# Patient Record
Sex: Male | Born: 1950 | Race: White | Hispanic: No | Marital: Married | Smoking: Former smoker
Health system: Southern US, Community
[De-identification: ages and names within clinical notes are randomized; demographics above are authoritative.]

## PROBLEM LIST (undated history)

## (undated) HISTORY — PX: LACERATION REPAIR: SHX5168

---

## 2019-10-05 ENCOUNTER — Other Ambulatory Visit: Payer: Self-pay

## 2019-10-05 DIAGNOSIS — Z20822 Contact with and (suspected) exposure to covid-19: Secondary | ICD-10-CM

## 2019-10-08 LAB — NOVEL CORONAVIRUS, NAA: SARS-CoV-2, NAA: NOT DETECTED

## 2019-10-13 DIAGNOSIS — R000 Tachycardia, unspecified: Secondary | ICD-10-CM | POA: Diagnosis not present

## 2019-10-13 DIAGNOSIS — R0789 Other chest pain: Secondary | ICD-10-CM | POA: Diagnosis not present

## 2019-10-13 DIAGNOSIS — M62838 Other muscle spasm: Secondary | ICD-10-CM | POA: Diagnosis not present

## 2019-10-14 ENCOUNTER — Emergency Department (HOSPITAL_COMMUNITY): Payer: Medicare Other

## 2019-10-14 ENCOUNTER — Inpatient Hospital Stay (HOSPITAL_COMMUNITY)
Admission: EM | Disposition: A | Discharge status: Rehabilitation Facility | Payer: Self-pay | Source: Home / Self Care | Attending: Neurological Surgery

## 2019-10-14 ENCOUNTER — Emergency Department (HOSPITAL_COMMUNITY): Payer: Medicare Other | Admitting: Certified Registered Nurse Anesthetist

## 2019-10-14 ENCOUNTER — Encounter (HOSPITAL_COMMUNITY): Payer: Self-pay | Admitting: Neurological Surgery

## 2019-10-14 ENCOUNTER — Other Ambulatory Visit: Payer: Self-pay

## 2019-10-14 ENCOUNTER — Inpatient Hospital Stay (HOSPITAL_COMMUNITY)
Admission: EM | Admit: 2019-10-14 | Discharge: 2019-10-18 | DRG: 029 | Disposition: A | Discharge status: Rehabilitation Facility | Payer: Medicare Other | Attending: Neurological Surgery | Admitting: Neurological Surgery

## 2019-10-14 DIAGNOSIS — G8221 Paraplegia, complete: Secondary | ICD-10-CM | POA: Diagnosis not present

## 2019-10-14 DIAGNOSIS — R251 Tremor, unspecified: Secondary | ICD-10-CM | POA: Diagnosis not present

## 2019-10-14 DIAGNOSIS — M4653 Other infective spondylopathies, cervicothoracic region: Secondary | ICD-10-CM | POA: Diagnosis present

## 2019-10-14 DIAGNOSIS — K592 Neurogenic bowel, not elsewhere classified: Secondary | ICD-10-CM | POA: Diagnosis not present

## 2019-10-14 DIAGNOSIS — G9520 Unspecified cord compression: Secondary | ICD-10-CM | POA: Diagnosis not present

## 2019-10-14 DIAGNOSIS — N318 Other neuromuscular dysfunction of bladder: Secondary | ICD-10-CM | POA: Diagnosis present

## 2019-10-14 DIAGNOSIS — R739 Hyperglycemia, unspecified: Secondary | ICD-10-CM | POA: Diagnosis present

## 2019-10-14 DIAGNOSIS — Z419 Encounter for procedure for purposes other than remedying health state, unspecified: Secondary | ICD-10-CM

## 2019-10-14 DIAGNOSIS — L02212 Cutaneous abscess of back [any part, except buttock]: Secondary | ICD-10-CM | POA: Diagnosis not present

## 2019-10-14 DIAGNOSIS — G4734 Idiopathic sleep related nonobstructive alveolar hypoventilation: Secondary | ICD-10-CM | POA: Diagnosis not present

## 2019-10-14 DIAGNOSIS — I480 Paroxysmal atrial fibrillation: Secondary | ICD-10-CM | POA: Diagnosis not present

## 2019-10-14 DIAGNOSIS — F1010 Alcohol abuse, uncomplicated: Secondary | ICD-10-CM | POA: Diagnosis present

## 2019-10-14 DIAGNOSIS — K5900 Constipation, unspecified: Secondary | ICD-10-CM | POA: Diagnosis not present

## 2019-10-14 DIAGNOSIS — M4804 Spinal stenosis, thoracic region: Secondary | ICD-10-CM | POA: Diagnosis present

## 2019-10-14 DIAGNOSIS — G9529 Other cord compression: Secondary | ICD-10-CM | POA: Diagnosis not present

## 2019-10-14 DIAGNOSIS — M4802 Spinal stenosis, cervical region: Secondary | ICD-10-CM | POA: Diagnosis not present

## 2019-10-14 DIAGNOSIS — D638 Anemia in other chronic diseases classified elsewhere: Secondary | ICD-10-CM | POA: Diagnosis present

## 2019-10-14 DIAGNOSIS — K5901 Slow transit constipation: Secondary | ICD-10-CM | POA: Diagnosis not present

## 2019-10-14 DIAGNOSIS — M48061 Spinal stenosis, lumbar region without neurogenic claudication: Secondary | ICD-10-CM | POA: Diagnosis not present

## 2019-10-14 DIAGNOSIS — L89150 Pressure ulcer of sacral region, unstageable: Secondary | ICD-10-CM | POA: Diagnosis not present

## 2019-10-14 DIAGNOSIS — I951 Orthostatic hypotension: Secondary | ICD-10-CM | POA: Diagnosis not present

## 2019-10-14 DIAGNOSIS — M5489 Other dorsalgia: Secondary | ICD-10-CM | POA: Diagnosis not present

## 2019-10-14 DIAGNOSIS — Z79899 Other long term (current) drug therapy: Secondary | ICD-10-CM | POA: Diagnosis not present

## 2019-10-14 DIAGNOSIS — B9689 Other specified bacterial agents as the cause of diseases classified elsewhere: Secondary | ICD-10-CM | POA: Diagnosis not present

## 2019-10-14 DIAGNOSIS — D62 Acute posthemorrhagic anemia: Secondary | ICD-10-CM | POA: Diagnosis not present

## 2019-10-14 DIAGNOSIS — S32040A Wedge compression fracture of fourth lumbar vertebra, initial encounter for closed fracture: Secondary | ICD-10-CM | POA: Diagnosis not present

## 2019-10-14 DIAGNOSIS — R269 Unspecified abnormalities of gait and mobility: Secondary | ICD-10-CM | POA: Diagnosis present

## 2019-10-14 DIAGNOSIS — G9589 Other specified diseases of spinal cord: Secondary | ICD-10-CM | POA: Diagnosis not present

## 2019-10-14 DIAGNOSIS — A419 Sepsis, unspecified organism: Secondary | ICD-10-CM | POA: Diagnosis not present

## 2019-10-14 DIAGNOSIS — R52 Pain, unspecified: Secondary | ICD-10-CM | POA: Diagnosis not present

## 2019-10-14 DIAGNOSIS — G061 Intraspinal abscess and granuloma: Principal | ICD-10-CM | POA: Diagnosis present

## 2019-10-14 DIAGNOSIS — R531 Weakness: Secondary | ICD-10-CM | POA: Diagnosis not present

## 2019-10-14 DIAGNOSIS — Z20828 Contact with and (suspected) exposure to other viral communicable diseases: Secondary | ICD-10-CM | POA: Diagnosis present

## 2019-10-14 DIAGNOSIS — R339 Retention of urine, unspecified: Secondary | ICD-10-CM | POA: Diagnosis not present

## 2019-10-14 DIAGNOSIS — G8220 Paraplegia, unspecified: Secondary | ICD-10-CM | POA: Diagnosis present

## 2019-10-14 DIAGNOSIS — D649 Anemia, unspecified: Secondary | ICD-10-CM | POA: Diagnosis not present

## 2019-10-14 DIAGNOSIS — M546 Pain in thoracic spine: Secondary | ICD-10-CM | POA: Diagnosis not present

## 2019-10-14 DIAGNOSIS — Z23 Encounter for immunization: Secondary | ICD-10-CM

## 2019-10-14 DIAGNOSIS — G062 Extradural and subdural abscess, unspecified: Secondary | ICD-10-CM | POA: Diagnosis present

## 2019-10-14 DIAGNOSIS — I82463 Acute embolism and thrombosis of calf muscular vein, bilateral: Secondary | ICD-10-CM | POA: Diagnosis not present

## 2019-10-14 DIAGNOSIS — K089 Disorder of teeth and supporting structures, unspecified: Secondary | ICD-10-CM | POA: Diagnosis present

## 2019-10-14 DIAGNOSIS — A491 Streptococcal infection, unspecified site: Secondary | ICD-10-CM

## 2019-10-14 DIAGNOSIS — L89312 Pressure ulcer of right buttock, stage 2: Secondary | ICD-10-CM | POA: Diagnosis not present

## 2019-10-14 DIAGNOSIS — G904 Autonomic dysreflexia: Secondary | ICD-10-CM | POA: Diagnosis not present

## 2019-10-14 DIAGNOSIS — R252 Cramp and spasm: Secondary | ICD-10-CM | POA: Diagnosis not present

## 2019-10-14 DIAGNOSIS — N319 Neuromuscular dysfunction of bladder, unspecified: Secondary | ICD-10-CM | POA: Diagnosis not present

## 2019-10-14 DIAGNOSIS — Z981 Arthrodesis status: Secondary | ICD-10-CM | POA: Diagnosis not present

## 2019-10-14 DIAGNOSIS — I10 Essential (primary) hypertension: Secondary | ICD-10-CM | POA: Diagnosis not present

## 2019-10-14 DIAGNOSIS — G8250 Quadriplegia, unspecified: Secondary | ICD-10-CM | POA: Diagnosis not present

## 2019-10-14 DIAGNOSIS — K006 Disturbances in tooth eruption: Secondary | ICD-10-CM | POA: Diagnosis not present

## 2019-10-14 HISTORY — PX: THORACIC LAMINECTOMY FOR EPIDURAL ABSCESS: SHX6115

## 2019-10-14 LAB — PROTIME-INR
INR: 1.3 — ABNORMAL HIGH (ref 0.8–1.2)
Prothrombin Time: 16 seconds — ABNORMAL HIGH (ref 11.4–15.2)

## 2019-10-14 LAB — CBC WITH DIFFERENTIAL/PLATELET
Abs Immature Granulocytes: 0.12 10*3/uL — ABNORMAL HIGH (ref 0.00–0.07)
Basophils Absolute: 0 10*3/uL (ref 0.0–0.1)
Basophils Relative: 0 %
Eosinophils Absolute: 0 10*3/uL (ref 0.0–0.5)
Eosinophils Relative: 0 %
HCT: 21.2 % — ABNORMAL LOW (ref 39.0–52.0)
Hemoglobin: 7.4 g/dL — ABNORMAL LOW (ref 13.0–17.0)
Immature Granulocytes: 1 %
Lymphocytes Relative: 8 %
Lymphs Abs: 1.3 10*3/uL (ref 0.7–4.0)
MCH: 31.6 pg (ref 26.0–34.0)
MCHC: 34.9 g/dL (ref 30.0–36.0)
MCV: 90.6 fL (ref 80.0–100.0)
Monocytes Absolute: 1 10*3/uL (ref 0.1–1.0)
Monocytes Relative: 6 %
Neutro Abs: 13.8 10*3/uL — ABNORMAL HIGH (ref 1.7–7.7)
Neutrophils Relative %: 85 %
Platelets: 464 10*3/uL — ABNORMAL HIGH (ref 150–400)
RBC: 2.34 MIL/uL — ABNORMAL LOW (ref 4.22–5.81)
RDW: 12 % (ref 11.5–15.5)
WBC: 16.2 10*3/uL — ABNORMAL HIGH (ref 4.0–10.5)
nRBC: 0 % (ref 0.0–0.2)

## 2019-10-14 LAB — COMPREHENSIVE METABOLIC PANEL
ALT: 39 U/L (ref 0–44)
AST: 29 U/L (ref 15–41)
Albumin: 2.5 g/dL — ABNORMAL LOW (ref 3.5–5.0)
Alkaline Phosphatase: 57 U/L (ref 38–126)
Anion gap: 12 (ref 5–15)
BUN: 9 mg/dL (ref 8–23)
CO2: 24 mmol/L (ref 22–32)
Calcium: 8.5 mg/dL — ABNORMAL LOW (ref 8.9–10.3)
Chloride: 96 mmol/L — ABNORMAL LOW (ref 98–111)
Creatinine, Ser: 0.7 mg/dL (ref 0.61–1.24)
GFR calc Af Amer: >60 mL/min (ref >60)
GFR calc non Af Amer: >60 mL/min (ref >60)
Glucose, Bld: 111 mg/dL — ABNORMAL HIGH (ref 70–99)
Potassium: 3.4 mmol/L — ABNORMAL LOW (ref 3.5–5.1)
Sodium: 132 mmol/L — ABNORMAL LOW (ref 135–145)
Total Bilirubin: 0.7 mg/dL (ref 0.3–1.2)
Total Protein: 6.2 g/dL — ABNORMAL LOW (ref 6.5–8.1)

## 2019-10-14 LAB — APTT: aPTT: 31 seconds (ref 24–36)

## 2019-10-14 LAB — ABO/RH: ABO/RH(D): A POS

## 2019-10-14 LAB — SARS CORONAVIRUS 2 (TAT 6-24 HRS): SARS Coronavirus 2: NEGATIVE

## 2019-10-14 LAB — URINALYSIS, ROUTINE W REFLEX MICROSCOPIC
Bilirubin Urine: NEGATIVE
Glucose, UA: NEGATIVE mg/dL
Hgb urine dipstick: NEGATIVE
Ketones, ur: NEGATIVE mg/dL
Leukocytes,Ua: NEGATIVE
Nitrite: NEGATIVE
Protein, ur: NEGATIVE mg/dL
Specific Gravity, Urine: 1.018 (ref 1.005–1.030)
pH: 6 (ref 5.0–8.0)

## 2019-10-14 LAB — LACTIC ACID, PLASMA: Lactic Acid, Venous: 0.7 mmol/L (ref 0.5–1.9)

## 2019-10-14 LAB — SEDIMENTATION RATE: Sed Rate: 110 mm/hr — ABNORMAL HIGH (ref 0–16)

## 2019-10-14 LAB — PREPARE RBC (CROSSMATCH)

## 2019-10-14 LAB — C-REACTIVE PROTEIN: CRP: 12.5 mg/dL — ABNORMAL HIGH (ref <1.0)

## 2019-10-14 SURGERY — THORACIC LAMINECTOMY FOR EPIDURAL ABSCESS
Anesthesia: General | Site: Back

## 2019-10-14 MED ORDER — METHOCARBAMOL 500 MG PO TABS
500.0000 mg | ORAL_TABLET | Freq: Four times a day (QID) | ORAL | Status: DC | PRN
  Administered 2019-10-15 – 2019-10-18 (×3): 500 mg via ORAL
  Filled 2019-10-14 (×4): qty 1

## 2019-10-14 MED ORDER — LACTATED RINGERS IV BOLUS
1000.0000 mL | Freq: Once | INTRAVENOUS | Status: AC
  Administered 2019-10-14: 1000 mL via INTRAVENOUS

## 2019-10-14 MED ORDER — PHENYLEPHRINE HCL-NACL 10-0.9 MG/250ML-% IV SOLN
INTRAVENOUS | Status: DC | PRN
  Administered 2019-10-14: 30 ug/min via INTRAVENOUS

## 2019-10-14 MED ORDER — THROMBIN 20000 UNITS EX SOLR
CUTANEOUS | Status: AC
  Filled 2019-10-14: qty 20000

## 2019-10-14 MED ORDER — MIDAZOLAM HCL 5 MG/5ML IJ SOLN
INTRAMUSCULAR | Status: DC | PRN
  Administered 2019-10-14: 2 mg via INTRAVENOUS

## 2019-10-14 MED ORDER — THROMBIN 5000 UNITS EX SOLR
CUTANEOUS | Status: AC
  Filled 2019-10-14: qty 10000

## 2019-10-14 MED ORDER — SUCCINYLCHOLINE CHLORIDE 200 MG/10ML IV SOSY
PREFILLED_SYRINGE | INTRAVENOUS | Status: AC
  Filled 2019-10-14: qty 10

## 2019-10-14 MED ORDER — SODIUM CHLORIDE 0.9 % IV SOLN
2.0000 g | Freq: Once | INTRAVENOUS | Status: AC
  Administered 2019-10-14: 2 g via INTRAVENOUS
  Filled 2019-10-14: qty 2

## 2019-10-14 MED ORDER — LACTATED RINGERS IV SOLN
INTRAVENOUS | Status: DC | PRN
  Administered 2019-10-14 (×3): via INTRAVENOUS

## 2019-10-14 MED ORDER — SUGAMMADEX SODIUM 200 MG/2ML IV SOLN
INTRAVENOUS | Status: DC | PRN
  Administered 2019-10-14: 200 mg via INTRAVENOUS

## 2019-10-14 MED ORDER — THROMBIN 20000 UNITS EX SOLR
CUTANEOUS | Status: DC | PRN
  Administered 2019-10-14: 20 mL

## 2019-10-14 MED ORDER — ONDANSETRON HCL 4 MG/2ML IJ SOLN
INTRAMUSCULAR | Status: AC
  Filled 2019-10-14: qty 2

## 2019-10-14 MED ORDER — PHENYLEPHRINE 40 MCG/ML (10ML) SYRINGE FOR IV PUSH (FOR BLOOD PRESSURE SUPPORT)
PREFILLED_SYRINGE | INTRAVENOUS | Status: AC
  Filled 2019-10-14: qty 10

## 2019-10-14 MED ORDER — MICROFIBRILLAR COLL HEMOSTAT EX POWD
CUTANEOUS | Status: AC
  Filled 2019-10-14: qty 5

## 2019-10-14 MED ORDER — GADOBUTROL 1 MMOL/ML IV SOLN
9.0000 mL | Freq: Once | INTRAVENOUS | Status: AC | PRN
  Administered 2019-10-14: 9 mL via INTRAVENOUS

## 2019-10-14 MED ORDER — BUPIVACAINE HCL (PF) 0.25 % IJ SOLN
INTRAMUSCULAR | Status: AC
  Filled 2019-10-14: qty 30

## 2019-10-14 MED ORDER — DEXAMETHASONE SODIUM PHOSPHATE 10 MG/ML IJ SOLN
INTRAMUSCULAR | Status: DC | PRN
  Administered 2019-10-14: 10 mg via INTRAVENOUS

## 2019-10-14 MED ORDER — SODIUM CHLORIDE (PF) 0.9 % IJ SOLN
INTRAMUSCULAR | Status: AC
  Filled 2019-10-14: qty 10

## 2019-10-14 MED ORDER — SODIUM CHLORIDE 0.9 % IV SOLN: 250.0000 mL | INTRAVENOUS | Status: DC

## 2019-10-14 MED ORDER — ROCURONIUM BROMIDE 100 MG/10ML IV SOLN
INTRAVENOUS | Status: DC | PRN
  Administered 2019-10-14 (×2): 50 mg via INTRAVENOUS

## 2019-10-14 MED ORDER — ROCURONIUM BROMIDE 10 MG/ML (PF) SYRINGE
PREFILLED_SYRINGE | INTRAVENOUS | Status: AC
  Filled 2019-10-14: qty 10

## 2019-10-14 MED ORDER — 0.9 % SODIUM CHLORIDE (POUR BTL) OPTIME
TOPICAL | Status: DC | PRN
  Administered 2019-10-14: 1000 mL

## 2019-10-14 MED ORDER — DEXAMETHASONE SODIUM PHOSPHATE 10 MG/ML IJ SOLN
INTRAMUSCULAR | Status: AC
  Filled 2019-10-14: qty 1

## 2019-10-14 MED ORDER — ACETAMINOPHEN 500 MG PO TABS
1000.0000 mg | ORAL_TABLET | Freq: Once | ORAL | Status: AC
  Administered 2019-10-14: 1000 mg via ORAL
  Filled 2019-10-14: qty 2

## 2019-10-14 MED ORDER — ONDANSETRON HCL 4 MG/2ML IJ SOLN
INTRAMUSCULAR | Status: DC | PRN
  Administered 2019-10-14: 4 mg via INTRAVENOUS

## 2019-10-14 MED ORDER — PROPOFOL 10 MG/ML IV BOLUS
INTRAVENOUS | Status: DC | PRN
  Administered 2019-10-14: 100 mg via INTRAVENOUS

## 2019-10-14 MED ORDER — PHENOL 1.4 % MT LIQD
1.0000 | OROMUCOSAL | Status: DC | PRN
  Filled 2019-10-14: qty 177

## 2019-10-14 MED ORDER — LACTATED RINGERS IV SOLN: INTRAVENOUS | Status: DC

## 2019-10-14 MED ORDER — SODIUM CHLORIDE 0.9% FLUSH: 3.0000 mL | INTRAVENOUS | Status: DC | PRN

## 2019-10-14 MED ORDER — BUPIVACAINE HCL (PF) 0.25 % IJ SOLN
INTRAMUSCULAR | Status: DC | PRN
  Administered 2019-10-14: 10 mL

## 2019-10-14 MED ORDER — PHENYLEPHRINE HCL (PRESSORS) 10 MG/ML IV SOLN
INTRAVENOUS | Status: DC | PRN
  Administered 2019-10-14 (×2): 120 ug via INTRAVENOUS
  Administered 2019-10-14 (×2): 80 ug via INTRAVENOUS

## 2019-10-14 MED ORDER — EPHEDRINE SULFATE 50 MG/ML IJ SOLN
INTRAMUSCULAR | Status: DC | PRN
  Administered 2019-10-14 (×4): 10 mg via INTRAVENOUS

## 2019-10-14 MED ORDER — SENNA 8.6 MG PO TABS
1.0000 | ORAL_TABLET | Freq: Two times a day (BID) | ORAL | Status: DC
  Administered 2019-10-15 – 2019-10-17 (×6): 8.6 mg via ORAL
  Filled 2019-10-14 (×7): qty 1

## 2019-10-14 MED ORDER — VANCOMYCIN HCL 10 G IV SOLR
2000.0000 mg | Freq: Once | INTRAVENOUS | Status: AC
  Administered 2019-10-14: 2000 mg via INTRAVENOUS
  Filled 2019-10-14: qty 2000

## 2019-10-14 MED ORDER — SODIUM CHLORIDE 0.9 % IV SOLN
INTRAVENOUS | Status: DC | PRN
  Administered 2019-10-14: 500 mL

## 2019-10-14 MED ORDER — SUFENTANIL CITRATE 50 MCG/ML IV SOLN
INTRAVENOUS | Status: DC | PRN
  Administered 2019-10-14 (×3): 10 ug via INTRAVENOUS

## 2019-10-14 MED ORDER — OXYCODONE HCL 5 MG PO TABS
5.0000 mg | ORAL_TABLET | ORAL | Status: DC | PRN
  Administered 2019-10-18: 5 mg via ORAL
  Filled 2019-10-14: qty 1

## 2019-10-14 MED ORDER — MORPHINE SULFATE (PF) 2 MG/ML IV SOLN: 2.0000 mg | INTRAVENOUS | Status: DC | PRN

## 2019-10-14 MED ORDER — ONDANSETRON HCL 4 MG PO TABS: 4.0000 mg | ORAL_TABLET | Freq: Four times a day (QID) | ORAL | Status: DC | PRN

## 2019-10-14 MED ORDER — POTASSIUM CHLORIDE IN NACL 20-0.9 MEQ/L-% IV SOLN
INTRAVENOUS | Status: DC
  Administered 2019-10-15: 01:00:00 via INTRAVENOUS
  Filled 2019-10-14: qty 1000

## 2019-10-14 MED ORDER — METHOCARBAMOL 1000 MG/10ML IJ SOLN
500.0000 mg | Freq: Four times a day (QID) | INTRAVENOUS | Status: DC | PRN
  Filled 2019-10-14: qty 5

## 2019-10-14 MED ORDER — LIDOCAINE 2% (20 MG/ML) 5 ML SYRINGE
INTRAMUSCULAR | Status: AC
  Filled 2019-10-14: qty 5

## 2019-10-14 MED ORDER — VASOPRESSIN 20 UNIT/ML IV SOLN
INTRAVENOUS | Status: AC
  Filled 2019-10-14: qty 1

## 2019-10-14 MED ORDER — MIDAZOLAM HCL 2 MG/2ML IJ SOLN
INTRAMUSCULAR | Status: AC
  Filled 2019-10-14: qty 2

## 2019-10-14 MED ORDER — ACETAMINOPHEN 325 MG PO TABS
650.0000 mg | ORAL_TABLET | ORAL | Status: DC | PRN
  Administered 2019-10-16 – 2019-10-17 (×3): 650 mg via ORAL
  Filled 2019-10-14 (×3): qty 2

## 2019-10-14 MED ORDER — PROPOFOL 10 MG/ML IV BOLUS
INTRAVENOUS | Status: AC
  Filled 2019-10-14: qty 20

## 2019-10-14 MED ORDER — SODIUM CHLORIDE 0.9 % IV SOLN
INTRAVENOUS | Status: DC | PRN
  Administered 2019-10-14 (×2): via INTRAVENOUS

## 2019-10-14 MED ORDER — ALBUMIN HUMAN 5 % IV SOLN
INTRAVENOUS | Status: DC | PRN
  Administered 2019-10-14: 20:00:00 via INTRAVENOUS

## 2019-10-14 MED ORDER — SUCCINYLCHOLINE CHLORIDE 20 MG/ML IJ SOLN
INTRAMUSCULAR | Status: DC | PRN
  Administered 2019-10-14: 120 mg via INTRAVENOUS

## 2019-10-14 MED ORDER — BACITRACIN ZINC 500 UNIT/GM EX OINT
TOPICAL_OINTMENT | CUTANEOUS | Status: DC | PRN
  Administered 2019-10-14: 1 via TOPICAL

## 2019-10-14 MED ORDER — MENTHOL 3 MG MT LOZG
1.0000 | LOZENGE | OROMUCOSAL | Status: DC | PRN
  Filled 2019-10-14 (×2): qty 9

## 2019-10-14 MED ORDER — ONDANSETRON HCL 4 MG/2ML IJ SOLN: 4.0000 mg | Freq: Four times a day (QID) | INTRAMUSCULAR | Status: DC | PRN

## 2019-10-14 MED ORDER — LIDOCAINE HCL (CARDIAC) PF 100 MG/5ML IV SOSY
PREFILLED_SYRINGE | INTRAVENOUS | Status: DC | PRN
  Administered 2019-10-14: 100 mg via INTRATRACHEAL

## 2019-10-14 MED ORDER — SODIUM CHLORIDE 0.9% FLUSH
3.0000 mL | Freq: Two times a day (BID) | INTRAVENOUS | Status: DC
  Administered 2019-10-15 – 2019-10-18 (×8): 3 mL via INTRAVENOUS

## 2019-10-14 MED ORDER — BACITRACIN ZINC 500 UNIT/GM EX OINT
TOPICAL_OINTMENT | CUTANEOUS | Status: AC
  Filled 2019-10-14: qty 28.35

## 2019-10-14 MED ORDER — SUFENTANIL CITRATE 50 MCG/ML IV SOLN
INTRAVENOUS | Status: AC
  Filled 2019-10-14: qty 1

## 2019-10-14 MED ORDER — THROMBIN 5000 UNITS EX SOLR
CUTANEOUS | Status: AC
  Filled 2019-10-14: qty 5000

## 2019-10-14 MED ORDER — ACETAMINOPHEN 650 MG RE SUPP: 650.0000 mg | RECTAL | Status: DC | PRN

## 2019-10-14 MED ORDER — THROMBIN 5000 UNITS EX SOLR
OROMUCOSAL | Status: DC | PRN
  Administered 2019-10-14 (×2): 5 mL

## 2019-10-14 MED ORDER — EPHEDRINE 5 MG/ML INJ
INTRAVENOUS | Status: AC
  Filled 2019-10-14: qty 10

## 2019-10-14 SURGICAL SUPPLY — 72 items
BAG DECANTER FOR FLEXI CONT (MISCELLANEOUS) ×3 IMPLANT
BENZOIN TINCTURE PRP APPL 2/3 (GAUZE/BANDAGES/DRESSINGS) ×5 IMPLANT
BLADE CLIPPER SURG (BLADE) IMPLANT
BUR MATCHSTICK NEURO 3.0 LAGG (BURR) ×2 IMPLANT
CANISTER SUCT 3000ML PPV (MISCELLANEOUS) ×3 IMPLANT
CARTRIDGE OIL MAESTRO DRILL (MISCELLANEOUS) ×1 IMPLANT
CLEANER TIP ELECTROSURG 2X2 (MISCELLANEOUS) ×2 IMPLANT
CLOSURE STERI-STRIP 1/2X4 (GAUZE/BANDAGES/DRESSINGS) ×3
CLOSURE WOUND 1/2 X4 (GAUZE/BANDAGES/DRESSINGS) ×1
CLSR STERI-STRIP ANTIMIC 1/2X4 (GAUZE/BANDAGES/DRESSINGS) ×3 IMPLANT
COVER WAND RF STERILE (DRAPES) ×3 IMPLANT
DERMABOND ADVANCED (GAUZE/BANDAGES/DRESSINGS) ×2
DERMABOND ADVANCED .7 DNX12 (GAUZE/BANDAGES/DRESSINGS) ×1 IMPLANT
DIFFUSER DRILL AIR PNEUMATIC (MISCELLANEOUS) ×3 IMPLANT
DRAPE C-ARM 42X72 X-RAY (DRAPES) ×4 IMPLANT
DRAPE LAPAROTOMY 100X72 PEDS (DRAPES) IMPLANT
DRAPE LAPAROTOMY 100X72X124 (DRAPES) ×3 IMPLANT
DRAPE MICROSCOPE LEICA (MISCELLANEOUS) ×3 IMPLANT
DRAPE POUCH INSTRU U-SHP 10X18 (DRAPES) ×3 IMPLANT
DRAPE SURG 17X23 STRL (DRAPES) ×6 IMPLANT
DRSG OPSITE POSTOP 4X10 (GAUZE/BANDAGES/DRESSINGS) ×2 IMPLANT
ELECT CAUTERY BLADE 6.4 (BLADE) ×2 IMPLANT
ELECT REM PT RETURN 9FT ADLT (ELECTROSURGICAL) ×3
ELECTRODE REM PT RTRN 9FT ADLT (ELECTROSURGICAL) ×1 IMPLANT
GAUZE 4X4 16PLY RFD (DISPOSABLE) IMPLANT
GAUZE SPONGE 4X4 12PLY STRL (GAUZE/BANDAGES/DRESSINGS) ×3 IMPLANT
GLOVE BIO SURGEON STRL SZ7 (GLOVE) IMPLANT
GLOVE BIO SURGEON STRL SZ8 (GLOVE) ×3 IMPLANT
GLOVE BIOGEL M 7.0 STRL (GLOVE) ×2 IMPLANT
GLOVE BIOGEL PI IND STRL 7.0 (GLOVE) IMPLANT
GLOVE BIOGEL PI IND STRL 7.5 (GLOVE) IMPLANT
GLOVE BIOGEL PI INDICATOR 7.0 (GLOVE)
GLOVE BIOGEL PI INDICATOR 7.5 (GLOVE) ×2
GOWN STRL REUS W/ TWL LRG LVL3 (GOWN DISPOSABLE) IMPLANT
GOWN STRL REUS W/ TWL XL LVL3 (GOWN DISPOSABLE) IMPLANT
GOWN STRL REUS W/TWL 2XL LVL3 (GOWN DISPOSABLE) IMPLANT
GOWN STRL REUS W/TWL LRG LVL3 (GOWN DISPOSABLE)
GOWN STRL REUS W/TWL XL LVL3 (GOWN DISPOSABLE)
HEMOSTAT POWDER KIT SURGIFOAM (HEMOSTASIS) IMPLANT
HEMOSTAT SURGICEL 2X14 (HEMOSTASIS) IMPLANT
KIT BASIN OR (CUSTOM PROCEDURE TRAY) ×3 IMPLANT
KIT TURNOVER KIT B (KITS) ×3 IMPLANT
MARKER SKIN DUAL TIP RULER LAB (MISCELLANEOUS) ×2 IMPLANT
NDL SPNL 20GX3.5 QUINCKE YW (NEEDLE) IMPLANT
NEEDLE HYPO 22GX1.5 SAFETY (NEEDLE) ×3 IMPLANT
NEEDLE SPNL 20GX3.5 QUINCKE YW (NEEDLE) IMPLANT
NS IRRIG 1000ML POUR BTL (IV SOLUTION) ×3 IMPLANT
OIL CARTRIDGE MAESTRO DRILL (MISCELLANEOUS) ×3
PACK LAMINECTOMY NEURO (CUSTOM PROCEDURE TRAY) ×3 IMPLANT
PATTIES SURGICAL .5 X3 (DISPOSABLE) ×1 IMPLANT
PATTIES SURGICAL 1X1 (DISPOSABLE) ×2 IMPLANT
RUBBERBAND STERILE (MISCELLANEOUS) ×2 IMPLANT
SPONGE LAP 4X18 RFD (DISPOSABLE) IMPLANT
SPONGE SURGIFOAM ABS GEL 100 (HEMOSTASIS) IMPLANT
STAPLER VISISTAT 35W (STAPLE) ×3 IMPLANT
STRIP CLOSURE SKIN 1/2X4 (GAUZE/BANDAGES/DRESSINGS) ×2 IMPLANT
SUT BONE WAX W31G (SUTURE) IMPLANT
SUT ETHILON 4 0 PS 2 18 (SUTURE) ×1 IMPLANT
SUT NURALON 4 0 TR CR/8 (SUTURE) IMPLANT
SUT PROLENE 6 0 BV (SUTURE) IMPLANT
SUT SILK 2 0 TIES 10X30 (SUTURE) ×1 IMPLANT
SUT VIC AB 0 CT1 18XCR BRD8 (SUTURE) IMPLANT
SUT VIC AB 0 CT1 8-18 (SUTURE) ×6
SUT VIC AB 2-0 CP2 18 (SUTURE) ×7 IMPLANT
SUT VIC AB 3-0 SH 8-18 (SUTURE) ×4 IMPLANT
SUT VICRYL 4-0 PS2 18IN ABS (SUTURE) IMPLANT
SWAB COLLECTION DEVICE MRSA (MISCELLANEOUS) ×2 IMPLANT
SWAB CULTURE ESWAB REG 1ML (MISCELLANEOUS) ×2 IMPLANT
TOWEL GREEN STERILE (TOWEL DISPOSABLE) ×3 IMPLANT
TOWEL GREEN STERILE FF (TOWEL DISPOSABLE) ×3 IMPLANT
TRAY FOLEY MTR SLVR 16FR STAT (SET/KITS/TRAYS/PACK) IMPLANT
WATER STERILE IRR 1000ML POUR (IV SOLUTION) ×3 IMPLANT

## 2019-10-14 NOTE — Anesthesia Procedure Notes (Signed)
Procedure Name: Intubation Date/Time: 10/14/2019 7:06 PM Performed by: Claris Che, CRNA Pre-anesthesia Checklist: Patient identified, Emergency Drugs available, Suction available, Patient being monitored and Timeout performed Patient Re-evaluated:Patient Re-evaluated prior to induction Oxygen Delivery Method: Circle system utilized Preoxygenation: Pre-oxygenation with 100% oxygen Induction Type: IV induction, Rapid sequence and Cricoid Pressure applied Laryngoscope Size: Mac and 4 Grade View: Grade II Tube type: Oral Tube size: 7.5 mm Number of attempts: 1 Airway Equipment and Method: Stylet Placement Confirmation: ETT inserted through vocal cords under direct vision,  positive ETCO2 and breath sounds checked- equal and bilateral Secured at: 23 cm Tube secured with: Tape Dental Injury: Teeth and Oropharynx as per pre-operative assessment

## 2019-10-14 NOTE — ED Provider Notes (Signed)
Care of patient assumed from [facility] at 3:05 PM.  Agree with history, physical exam and plan.  See their note for further details.  Briefly, 68 y.o. male with PMH/PSH as below who presents with back pain and decreased sensation as well as weakness of BLE. Weakness noted this morning upon trying to go to the bathroom. Recent viral URI +Fever   Neurology aware  No past medical history on file.    Current Plan: Patient currently in MRI receiving C/T/L spine WWO to assess for spinal cord etiology of symptoms    MDM/ED Course: Patient returned from MRI.  Upon initial assessment, patient states he feels like his numbness is increasing and traveling up his body.  Patient is awake alert in no acute respiratory distress.  He denies any difficulty breathing.  On examination, patient has flaccid paralysis of bilateral lower extremities.  Absent patellar reflexes.  Patient reports he does have some gross sensation of his right thigh on examination.  He otherwise has no sensation of his entire left lower extremity and right lower extremity distal to the knee.  Patient endorses decreased sensation just above his umbilicus that is worsening since arrival. There is no obvious midline tenderness palpation on his back but he states he has decreased sensation of his back as well. Progression of exam findings is very concerning.   Patient and wife state he has had a fever for 12 days.  He had viral URI symptoms and thought this was the cause.  He adamantly denies any falls or trauma to his back.  Patient denies any history of IV drug use.  Upon reviewing MRI imaging with attending, concern for epidural abscess collection.  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  Attending physician immediately consulted neurosurgery requesting their prompt evaluation and likely emergent OR decompression. Patient then taken emergency  to the OR with NSU.    Consults: NSU    Significant labs/images:  I personally reviewed and interpreted all labs.  The plan for this patient was discussed with Dr. Dayna Barker, who voiced agreement and who oversaw evaluation and treatment of this patient.    Burns Spain, MD 10/15/19 1700    Mesner, Corene Cornea, MD 10/16/19 [phone]

## 2019-10-14 NOTE — ED Provider Notes (Addendum)
[facility] Provider Note   CSN: [id number] Arrival date & time:        History   Chief Complaint No chief complaint on file.   HPI Ryan Reed is a 68 y.o. male.     HPI Patient presents to the emergency department with inability to move his lower extremities.  The patient states that he has been having mid back pain that he was seen for to urgent care yesterday.  The patient states he was given lidocaine patches, muscle relaxer, oxycodone, and an anti-inflammatory type medication.  The patient states he recently did have an upper respiratory infection to which he had 2 - COVID-19 test.  The patient states that he had fever associated with these upper respiratory symptoms.  Patient states that when he attempted to get up this morning around 1 AM is unable to use his left leg and is having tingling to the extremity along with the right.  The patient states that he can move his right foot but cannot move his right leg.  Patient states that nothing seems to make the condition better or worse.  The patient denies chest pain, shortness of breath, headache,blurred vision, neck pain, fever, cough,  dizziness, anorexia, edema, abdominal pain, nausea, vomiting, diarrhea, rash, dysuria, hematemesis, bloody stool, near syncope, or syncope. No past medical history on file.  There are no active problems to display for this patient.       Home Medications    Prior to Admission medications   Not on File    Family History No family history on file.  Social History Social History   Tobacco Use   Smoking status: Not on file  Substance Use Topics   Alcohol use: Not on file   Drug use: Not on file     Allergies   Patient has no known allergies.   Review of Systems Review of Systems  All other systems negative except as documented in the HPI. All pertinent positives and negatives as reviewed in the HPI. Physical Exam Updated  Vital Signs BP (!) 150/71    Pulse 88    Temp 99.2 F (37.3 C) (Oral)    Resp 15    Ht 6\' 2"  (1.88 m)    Wt 90.7 kg    SpO2 99%    BMI 25.68 kg/m   Physical Exam Vitals signs and nursing note reviewed.  Constitutional:      General: He is not in acute distress.    Appearance: He is well-developed.  HENT:     Head: Normocephalic and atraumatic.  Eyes:     Pupils: Pupils are equal, round, and reactive to light.  Neck:     Musculoskeletal: Normal range of motion and neck supple.  Cardiovascular:     Rate and Rhythm: Normal rate and regular rhythm.     Heart sounds: Normal heart sounds. No murmur. No friction rub. No gallop.   Pulmonary:     Effort: Pulmonary effort is normal. No respiratory distress.     Breath sounds: Normal breath sounds. No wheezing.  Abdominal:     General: Bowel sounds are normal. There is no distension.     Palpations: Abdomen is soft.     Tenderness: There is no abdominal tenderness.  Skin:    General: Skin is warm and dry.     Capillary Refill: Capillary refill takes less than 2 seconds.     Findings: No erythema or rash.  Neurological:  Mental Status: He is alert and oriented to person, place, and time.     Sensory: Sensory deficit present.     Motor: Weakness present. No abnormal muscle tone.     Coordination: Coordination abnormal.     Deep Tendon Reflexes: Reflexes abnormal.     Comments: Patient has what he considers decreased gross sensation in the right leg more than the left.  Patient states he can feel on both sides but feels that it is more decreased on the right.  The patient is unable to lift his left leg or push down against my hand.  The patient is able to move his right foot but is weak with strength on pushing down against my hand.  The patient also from best I can tell has abnormal patellar reflexes.  Patient has decreased sensation more on the right leg than the left but this occurs up both sides of his leg to the waist.  The patient has  normal strength in upper extremities but he does have what he feels like a sensory deficit on the right compared to the left.  Psychiatric:        Behavior: Behavior normal.      ED Treatments / Results  Labs (all labs ordered are listed, but only abnormal results are displayed) Labs Reviewed - No data to display  EKG None  Radiology No results found.  Procedures Procedures (including critical care time)  Medications Ordered in ED Medications - No data to display   Initial Impression / Assessment and Plan / ED Course  I have reviewed the triage vital signs and the nursing notes.  Pertinent labs & imaging results that were available during my care of the patient were reviewed by me and considered in my medical decision making (see chart for details).        The concern for this patient is due to a recent infection of unknown etiology he could have Guillain-Barr syndrome along with potential spinal cord issue.  The patient is having back pain that was preceding this loss of sensation and strength and function.  I am very concerned about the patient and I spoke with neurology early on to get their input and he agreed that we need to do MRI of the spine based on the physical exam findings.  The neurologist was Dr. Leonel Ramsay.   The nurse and I both were very aware of the gravity of the situation and she called MRI multiple times during this event.  I did advise her that she needs to advise the MRI technologist that I would like this patient bumped to the head of the line.  The nurse continued to call at my request to the MRI suite about this patient.  They advised that they had a long scan they were doing on one occasion and then another occasion stated that they did not have anyone to do the study on another machine.  Several times the nurse called and they did not answer as well.   1:54 PM attempted to expedite the patient's MRI to no avail of her multiple attempts since early  this morning.  The patient has been significantly delayed due to MRI constraints.  I spoke with Dr. Carlis Abbott of radiology and ask if he could help expedite the patient going to MRI.  Patient does have significant neurological impairment on examination and this needs to be further investigated and the best modality is MRI.  As mentioned before spoke with neurology earlier this  morning about the patient and they agreed wholeheartedly about the MRIs.  I am not exactly sure of how many times the nurses call an MRI but it is at least 5.  2:20 PM.  I was walking past room 22 that had a Covid +68 year old patient that they were there to transport to MRI who had been in our emergency department for 27+ hours.  I advised the nurse that I offered the MRI transporter was there to pick up the patient as I walked by.  I advised the nurse to call MRI and advised that our patient is more sick and that they would need to be sent.  The charge nurse at the time overheard this conversation and stepped in and advised me that she authorized him to go to MRI because she needs to open that room because they been in the emergency department for such a long period of time and they are Covid positive.  I made the statement that my patient has something very wrong with his spinal cord and would need to go to the MRI most likely once we get the MRI results.  I advised the charge nurse that that room would open up quickly once this happened.  My concern with this Covid patient going to the MRI suite is that they would have to close down the scanner for an hour after the procedure.  And that would further delayed my patient going to have this MRI. Final Clinical Impressions(s) / ED Diagnoses   Final diagnoses:  None    ED Discharge Orders    None       Dalia Heading, PA-C 10/14/19 1531    Louvinia Cumbo, Harrell Gave, PA-C 10/15/19 [id number]    Davonna Belling, MD 10/15/19 2358

## 2019-10-14 NOTE — ED Provider Notes (Signed)
Review of the MRI it is.  Has a large epidural abscess.  I personally called the radiologist and discussed it with Dr. Carlis Abbott who confirms T1-T7 abscess with cord impingement.  On my examination this time patient has decreased sensation all the way up to the level of the umbilicus on the left with decrease sensation up to his knee on the right. He now cannot move either foot. He is still breathing okay. His skin is warm and moist. Page was immediately put out to neurosurgery.  I discussed with them at approximately 1730 the MRI findings, physical exam that is progressively worsening while in the emergency department.  On review of vital signs and labs, the Patient is septic at this point, antibiotics ordered, code sepsis initiated.   CRITICAL CARE Performed by: Merrily Pew Total critical care time: 40 minutes Critical care time was exclusive of separately billable procedures and treating other patients. Critical care was necessary to treat or prevent imminent or life-threatening deterioration. Critical care was time spent personally by me on the following activities: development of treatment plan with patient and/or surrogate as well as nursing, discussions with consultants, evaluation of patient's response to treatment, examination of patient, obtaining history from patient or surrogate, ordering and performing treatments and interventions, ordering and review of laboratory studies, ordering and review of radiographic studies, pulse oximetry and re-evaluation of patient's condition.    Merrily Pew, MD 10/14/19 2221

## 2019-10-14 NOTE — Transfer of Care (Signed)
Immediate Anesthesia Transfer of Care Note  Patient: [facility]  Procedure(s) Performed: Cervical seven - THORACIC seven LAMINECTOMIES FOR EPIDURAL ABSCESS (N/A Back)  Patient Location: PACU  Anesthesia Type:General  Level of Consciousness: awake, oriented, drowsy and patient cooperative  Airway & Oxygen Therapy: Patient Spontanous Breathing and Patient connected to face mask oxygen  Post-op Assessment: Report given to RN, Post -op Vital signs reviewed and stable and Movement as pre-op  Post vital signs: Reviewed and stable  Last Vitals:  Vitals Value Taken Time  BP    Temp    Pulse    Resp    SpO2      Last Pain:  Vitals:   10/14/19 2344  TempSrc:   PainSc: (P) 0-No pain         Complications: No apparent anesthesia complications

## 2019-10-14 NOTE — Op Note (Signed)
10/14/2019  11:25 PM  PATIENT:  [facility]  68 y.o. 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  POST-OPERATIVE DIAGNOSIS:  same  PROCEDURE: C7-T7 posterior decompressive laminectomy, medial facetectomy and foraminotomy, with evacuation of ventral epidural abscess  SURGEON:  Sherley Bounds, MD  ASSISTANTS: Glenford Peers FNP  ANESTHESIA:   General  EBL: 1200 ml  Total I/O In: 5510 [I.V.:4000; Blood:1260; IV Piggyback:250] Out: 2200 [Urine:1000; Blood:1200]  BLOOD ADMINISTERED: 4 units packed red blood cells  DRAINS: 2 medium Hemovac drains  SPECIMEN: Operative cultures were obtained from the left C7-T1 facet joint  INDICATION FOR PROCEDURE: This patient presented with rapidly progressive paraplegia with numbness. Imaging showed large ventral epidural abscess from C7-T1 down to T7 with cord compression.  Recommended emergent laminectomy with attempted evacuation of ventral epidural abscess. Patient understood the risks, benefits, and alternatives and potential outcomes and wished to proceed.  PROCEDURE DETAILS: The patient was taken to the operating room and after induction of adequate generalized endotracheal anesthesia, the patient's head was affixed in a three-point Mayfield headrest and he was rolled into the prone position on chest rolls and all pressure points were padded. The cervicothoracic region was cleaned and then prepped with DuraPrep and draped in the usual sterile fashion.10 cc of local anesthesia was injected and then a dorsal midline incision was made and carried down to the cervicothoracic fascia C7-T7. The fascia was opened and the paraspinous musculature was taken down in a subperiosteal fashion to expose C7-T7 bilaterally.  Large amounts of pus came from the left C7-T1 facet joint.  Intraoperative fluoroscopy confirmed my level C7-T1 and all agreed, and then I used a combination of the  high-speed drill and the Kerrison punches to perform a laminectomy, medial facetectomy, and foraminotomy at C7-T1 all the way down to T7-T8 bilaterally. The underlying yellow ligament was opened and removed in a piecemeal fashion to expose the underlying dura at each level.  I undercut the lateral recess and dissected down until I was medial to and distal to the pedicle at each level.. The nerve root was well decompressed. We then gently retracted the dura a millimeter or 2 at each level bilaterally to see the ventral anterior space as best we could and used a nerve hook and finally a coronary dilator at each successive level to palpate anterior to the cord and remove large amounts of purulence.  We started at C7-T1 and T1-T2 on the left and worked our way distally on the left and then worked our way proximally on the right.  We widened our medial facetectomy on the right-hand side while we did so.  We then did the same procedure and noticed much less purulence.  We then did the same procedure once again.  At this point we felt we had done what we could do to remove as much of the purulence from the ventral epidural space as we could.  We were quite careful during our decompression and during the removal of the purulence to not put undue pressure on the spinal cord.  I irrigated with saline solution containing bacitracin. Achieved hemostasis with bipolar cautery, lined the dura with Gelfoam, laced 2 medium Hemovac drains through separate stab incisions, and then closed the muscle and the fascia with 0 Vicryl. I closed the subcutaneous tissues with 2-0 Vicryl and the subcuticular tissues with 3-0 Vicryl. The skin was then closed with benzoin and Steri-Strips. The drapes were removed, a sterile dressing was applied.  My nurse practitioner was involved in the exposure, safe retraction of the neural elements, the disc work and the closure. the patient was awakened from general anesthesia and transferred to the recovery  room in stable condition. At the end of the procedure all sponge, needle and instrument counts were correct.    PLAN OF CARE: Admit to inpatient   PATIENT DISPOSITION:  ICU - extubated and stable.   Delay start of Pharmacological VTE agent (>24hrs) due to surgical blood loss or risk of bleeding:  yes

## 2019-10-14 NOTE — ED Notes (Signed)
Pt sts that while the numbness was up to his low abdomen earlier, it has risen to his rib cage now.

## 2019-10-14 NOTE — ED Triage Notes (Addendum)
Pt here from home for inability to move bilateral legs and numbness to L leg onset 0100 this morning. Pt having mid back pain without injury and seen for that yesterday, given lidocaine patches, muscle relaxer, "something for swelling and some kind of -codone," which he is taking as prescribed. Until Thursday, pt having flu-like symptoms but none since Thursday, two negative covid tests. Pt rolled out of bed this morning as he was trying to get out of bed and was unable to use L leg. Denies injury, not on blood thinners. Sts he had an episode of incontinence at that time as well.

## 2019-10-14 NOTE — Anesthesia Preprocedure Evaluation (Addendum)
Anesthesia Evaluation  Patient identified by MRN, date of birth, ID band Patient awake    Reviewed: Allergy & Precautions, NPO status , Patient's Chart, lab work & pertinent test results  Airway Mallampati: II  TM Distance: >3 FB Neck ROM: Full    Dental  (+) Dental Advisory Given, Poor Dentition, Chipped, Missing   Pulmonary neg pulmonary ROS,    Pulmonary exam normal breath sounds clear to auscultation       Cardiovascular negative cardio ROS Normal cardiovascular exam Rhythm:Regular Rate:Normal     Neuro/Psych Epidural Abscess: T1-T7 abscess with cord impingement negative psych ROS   GI/Hepatic negative GI ROS, Neg liver ROS,   Endo/Other  negative endocrine ROS  Renal/GU negative Renal ROS     Musculoskeletal negative musculoskeletal ROS (+)   Abdominal   Peds  Hematology  (+) Blood dyscrasia, anemia ,   Anesthesia Other Findings Day of surgery medications reviewed with the patient.  Reproductive/Obstetrics                           Anesthesia Physical Anesthesia Plan  ASA: II and emergent  Anesthesia Plan: General   Post-op Pain Management:    Induction: Intravenous  PONV Risk Score and Plan: 2 and Ondansetron and Dexamethasone  Airway Management Planned: Oral ETT  Additional Equipment: Arterial line  Intra-op Plan:   Post-operative Plan: Extubation in OR and Possible Post-op intubation/ventilation  Informed Consent: I have reviewed the patients History and Physical, chart, labs and discussed the procedure including the risks, benefits and alternatives for the proposed anesthesia with the patient or authorized representative who has indicated his/her understanding and acceptance.     Dental advisory given and Only emergency history available  Plan Discussed with: CRNA  Anesthesia Plan Comments:         Anesthesia Quick Evaluation

## 2019-10-14 NOTE — H&P (Signed)
Subjective: Patient is a 68 y.o. 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 Onset of symptoms was several hours ago, rapidly worsening since that time.  The pain is rated severe, and is located at the back.  He has a 3 level below the nipples, cannot feel his Foley catheter, no movement of the lower extremities, this seems to be rapidly progressing proximally point that he has some numbness in his arms.  the pain is described as aching and occurs all day. The symptoms have been progressive. Symptoms are exacerbated by nothing in particular. MRI or CT showed ventral epidural abscess C7-T7 with cord compression and severe spinal stenosis.  Has had some pain in his mouth and has poor dentition.  Had about a 2-week history of fevers and chills.  Covid test negative.  He went to urgent care yesterday with back pain was treated with muscle relaxants and pain pills.  When numbness started this morning they thought it be medication related.  In emergency department had a progressive course.    History reviewed. No pertinent past medical history.  History reviewed. No pertinent surgical history.  Prior to Admission medications   Medication Sig Start Date End Date Taking? Authorizing Provider  HYDROcodone-acetaminophen (NORCO/VICODIN) 5-325 MG tablet Take 1 tablet by mouth every 6 (six) hours as needed for moderate pain.   Yes [provider]  tiZANidine (ZANAFLEX) 2 MG tablet Take 1 mg by mouth every 6 (six) hours as needed for muscle spasms.   Yes [provider]   No Known Allergies  Social History   Tobacco Use  . Smoking status: Not on file  Substance Use Topics  . Alcohol use: Not on file    History reviewed. No pertinent family history.   Review of Systems  Positive ROS: As above  All other systems have been reviewed and were otherwise negative with the exception of those mentioned in the HPI and as above.  Objective: Vital signs  in last 24 hours: Temp:  [99.2 F (37.3 C)-102.2 F (39 C)] 102.2 F (39 C) (11/23 1357) Pulse Rate:  [85-110] 87 (11/23 1830) Resp:  [12-19] 12 (11/23 1830) BP: (101-156)/(58-98) 122/62 (11/23 1830) SpO2:  [93 %-99 %] 98 % (11/23 1830) Weight:  [90.7 kg] 90.7 kg (11/23 0826)  General Appearance: Alert, cooperative, no distress but obvious discomfort, appears stated age Head: Normocephalic, without obvious abnormality, atraumatic Eyes: PERRL, conjunctiva/corneas clear, EOM's intact    Neck: Supple, symmetrical, trachea midline Back: Symmetric, no curvature, ROM normal, no CVA tenderness Lungs:  respirations unlabored Heart: Regular rate and rhythm Abdomen: Soft, non-tender Extremities: Extremities normal, atraumatic, no cyanosis or edema Pulses: 2+ and symmetric all extremities Skin: Skin color, texture, turgor normal, no rashes or lesions  NEUROLOGIC:   Mental status: Alert and oriented x4,  no aphasia, good attention span, fund of knowledge, and memory appear to be appropriate Motor Exam - grossly normal the upper extremities were completely flaccid in the lower extremities some involuntary reflexive movements at the ankles Sensory Exam -grossly decreased from the nipple line distally Reflexes: Absent, no clonus.  Babinski upgoing  coordination - grossly normal upper extremities but absent in the lower extremities Gait -unable to test Balance -unable to test Cranial Nerves: I: smell Not tested  II: visual acuity  OS: nl    OD: nl  II: visual fields Full to confrontation  II: pupils Equal, round, reactive to light  III,VII: ptosis None  III,IV,VI: extraocular muscles  Full ROM  V: mastication Normal  V: facial light touch sensation  Normal  V,VII: corneal reflex  Present  VII: facial muscle function - upper  Normal  VII: facial muscle function - lower Normal  VIII: hearing Not tested  IX: soft palate elevation  Normal  IX,X: gag reflex Present  XI: trapezius strength   5/5  XI: sternocleidomastoid strength 5/5  XI: neck flexion strength  5/5  XII: tongue strength  Normal    Data Review Lab Results  Component Value Date   WBC 16.2 (H) 11/12/2019   HGB 7.4 (L) 11/12/19   HCT 21.2 (L) November 12, 2019   MCV 90.6 11/12/2019   PLT 464 (H) 11/12/2019   Lab Results  Component Value Date   NA 132 (L) 11-12-2019   K 3.4 (L) 11/12/2019   CL 96 (L) 12-Nov-2019   CO2 24 11-12-2019   BUN 9 2019-11-12   CREATININE 0.70 Nov 12, 2019   GLUCOSE 111 (H) 11-12-19   Lab Results  Component Value Date   INR 1.3 (H) 2019-11-12    Assessment/Plan:  Estimated body mass index is 25.68 kg/m as calculated from the following:   Height as of this encounter: 6\' 2"  (1.88 m).   Weight as of this encounter: 90.7 kg. Patient admitted for cervicothoracic laminectomy for ventral epidural abscess with paraplegia.  The patient is taken emergently to the OR.  I have had a complex patient with his wife regarding reason for the surgery, potential outcomes of surgery, likelihood of continued paraplegia, need for prolonged antibiotics, and possible rehab.  I explained the condition and procedure to the patient and answered any questions.  Patient wishes to proceed with procedure as planned. Understands risks/ benefits and typical outcomes of procedure.  Understands risk of surgery include but not limited to bleeding, infection, CSF leak, spinal cord injury, numbness, weakness, paralysis, loss of bowel bladder or sexual function, lack of relief of symptoms, worsening symptoms, further surgery, instability, anesthesia risk including DVT pneumonia MI and death.   Ryan Reed 11-12-19 6:43 PM

## 2019-10-14 NOTE — ED Notes (Signed)
Patient transported to MRI 

## 2019-10-15 ENCOUNTER — Inpatient Hospital Stay (HOSPITAL_COMMUNITY): Payer: Medicare Other

## 2019-10-15 ENCOUNTER — Other Ambulatory Visit: Payer: Self-pay

## 2019-10-15 ENCOUNTER — Encounter (HOSPITAL_COMMUNITY): Payer: Self-pay | Admitting: Neurological Surgery

## 2019-10-15 DIAGNOSIS — G8220 Paraplegia, unspecified: Secondary | ICD-10-CM | POA: Diagnosis not present

## 2019-10-15 DIAGNOSIS — G4734 Idiopathic sleep related nonobstructive alveolar hypoventilation: Secondary | ICD-10-CM

## 2019-10-15 DIAGNOSIS — G062 Extradural and subdural abscess, unspecified: Secondary | ICD-10-CM

## 2019-10-15 DIAGNOSIS — I480 Paroxysmal atrial fibrillation: Secondary | ICD-10-CM | POA: Diagnosis not present

## 2019-10-15 DIAGNOSIS — A419 Sepsis, unspecified organism: Secondary | ICD-10-CM

## 2019-10-15 LAB — POCT I-STAT, CHEM 8
BUN: 11 mg/dL (ref 8–23)
Calcium, Ion: 1.17 mmol/L (ref 1.15–1.40)
Chloride: 103 mmol/L (ref 98–111)
Creatinine, Ser: 0.6 mg/dL — ABNORMAL LOW (ref 0.61–1.24)
Glucose, Bld: 163 mg/dL — ABNORMAL HIGH (ref 70–99)
HCT: 33 % — ABNORMAL LOW (ref 39.0–52.0)
Hemoglobin: 11.2 g/dL — ABNORMAL LOW (ref 13.0–17.0)
Potassium: 3.6 mmol/L (ref 3.5–5.1)
Sodium: 136 mmol/L (ref 135–145)
TCO2: 26 mmol/L (ref 22–32)

## 2019-10-15 LAB — URINE CULTURE: Culture: NO GROWTH

## 2019-10-15 LAB — CBC
HCT: 25.7 % — ABNORMAL LOW (ref 39.0–52.0)
HCT: 25.8 % — ABNORMAL LOW (ref 39.0–52.0)
Hemoglobin: 8.9 g/dL — ABNORMAL LOW (ref 13.0–17.0)
Hemoglobin: 9 g/dL — ABNORMAL LOW (ref 13.0–17.0)
MCH: 30.6 pg (ref 26.0–34.0)
MCH: 30.5 pg (ref 26.0–34.0)
MCHC: 34.6 g/dL (ref 30.0–36.0)
MCHC: 34.9 g/dL (ref 30.0–36.0)
MCV: 88.3 fL (ref 80.0–100.0)
MCV: 87.5 fL (ref 80.0–100.0)
Platelets: 349 10*3/uL (ref 150–400)
Platelets: 390 10*3/uL (ref 150–400)
RBC: 2.91 MIL/uL — ABNORMAL LOW (ref 4.22–5.81)
RBC: 2.95 MIL/uL — ABNORMAL LOW (ref 4.22–5.81)
RDW: 13.2 % (ref 11.5–15.5)
RDW: 13.3 % (ref 11.5–15.5)
WBC: 14.7 10*3/uL — ABNORMAL HIGH (ref 4.0–10.5)
WBC: 18.4 10*3/uL — ABNORMAL HIGH (ref 4.0–10.5)
nRBC: 0 % (ref 0.0–0.2)
nRBC: 0 % (ref 0.0–0.2)

## 2019-10-15 LAB — RENAL FUNCTION PANEL
Albumin: 2.1 g/dL — ABNORMAL LOW (ref 3.5–5.0)
Anion gap: 10 (ref 5–15)
BUN: 11 mg/dL (ref 8–23)
CO2: 23 mmol/L (ref 22–32)
Calcium: 8 mg/dL — ABNORMAL LOW (ref 8.9–10.3)
Chloride: 104 mmol/L (ref 98–111)
Creatinine, Ser: 0.56 mg/dL — ABNORMAL LOW (ref 0.61–1.24)
GFR calc Af Amer: >60 mL/min (ref >60)
GFR calc non Af Amer: >60 mL/min (ref >60)
Glucose, Bld: 148 mg/dL — ABNORMAL HIGH (ref 70–99)
Phosphorus: 3.8 mg/dL (ref 2.5–4.6)
Potassium: 3.9 mmol/L (ref 3.5–5.1)
Sodium: 137 mmol/L (ref 135–145)

## 2019-10-15 LAB — ECHOCARDIOGRAM COMPLETE
Height: 74 in
Weight: 3200 oz

## 2019-10-15 LAB — MRSA PCR SCREENING: MRSA by PCR: NEGATIVE

## 2019-10-15 LAB — MAGNESIUM: Magnesium: 2.1 mg/dL (ref 1.7–2.4)

## 2019-10-15 MED ORDER — ADULT MULTIVITAMIN W/MINERALS CH
1.0000 | ORAL_TABLET | Freq: Every day | ORAL | Status: DC
  Administered 2019-10-15 – 2019-10-16 (×2): 1 via ORAL
  Filled 2019-10-15 (×2): qty 1

## 2019-10-15 MED ORDER — LORAZEPAM 1 MG PO TABS
1.0000 mg | ORAL_TABLET | ORAL | Status: DC | PRN
  Administered 2019-10-15: 1 mg via ORAL
  Filled 2019-10-15 (×2): qty 1

## 2019-10-15 MED ORDER — THIAMINE HCL 100 MG/ML IJ SOLN: 100.0000 mg | Freq: Every day | INTRAMUSCULAR | Status: DC

## 2019-10-15 MED ORDER — VANCOMYCIN HCL 10 G IV SOLR
1250.0000 mg | Freq: Three times a day (TID) | INTRAVENOUS | Status: DC
  Administered 2019-10-15 – 2019-10-16 (×5): 1250 mg via INTRAVENOUS
  Filled 2019-10-15 (×6): qty 1250

## 2019-10-15 MED ORDER — SODIUM CHLORIDE 0.9 % IV SOLN
INTRAVENOUS | Status: DC | PRN
  Administered 2019-10-15: 250 mL via INTRAVENOUS

## 2019-10-15 MED ORDER — LORAZEPAM 1 MG PO TABS: 0.0000 mg | ORAL_TABLET | Freq: Four times a day (QID) | ORAL | Status: DC

## 2019-10-15 MED ORDER — PANTOPRAZOLE SODIUM 40 MG PO TBEC: 40.0000 mg | DELAYED_RELEASE_TABLET | Freq: Every day | ORAL | Status: DC

## 2019-10-15 MED ORDER — CHLORHEXIDINE GLUCONATE CLOTH 2 % EX PADS
6.0000 | MEDICATED_PAD | Freq: Every day | CUTANEOUS | Status: DC
  Administered 2019-10-15 – 2019-10-18 (×4): 6 via TOPICAL

## 2019-10-15 MED ORDER — LORAZEPAM 2 MG/ML IJ SOLN: 1.0000 mg | INTRAMUSCULAR | Status: DC | PRN

## 2019-10-15 MED ORDER — FOLIC ACID 1 MG PO TABS
1.0000 mg | ORAL_TABLET | Freq: Every day | ORAL | Status: DC
  Administered 2019-10-15 – 2019-10-16 (×2): 1 mg via ORAL
  Filled 2019-10-15 (×2): qty 1

## 2019-10-15 MED ORDER — LORAZEPAM 1 MG PO TABS: 0.0000 mg | ORAL_TABLET | Freq: Three times a day (TID) | ORAL | Status: DC

## 2019-10-15 MED ORDER — METOPROLOL TARTRATE 25 MG PO TABS
25.0000 mg | ORAL_TABLET | Freq: Two times a day (BID) | ORAL | Status: DC
  Administered 2019-10-15 – 2019-10-18 (×7): 25 mg via ORAL
  Filled 2019-10-15 (×8): qty 1

## 2019-10-15 MED ORDER — VITAMIN B-1 100 MG PO TABS
100.0000 mg | ORAL_TABLET | Freq: Every day | ORAL | Status: DC
  Administered 2019-10-15 – 2019-10-16 (×2): 100 mg via ORAL
  Filled 2019-10-15 (×2): qty 1

## 2019-10-15 MED FILL — Thrombin For Soln 5000 Unit: CUTANEOUS | Qty: 5000 | Status: AC

## 2019-10-15 MED FILL — Gelatin Absorbable MT Powder: OROMUCOSAL | Qty: 1 | Status: AC

## 2019-10-15 NOTE — Progress Notes (Signed)
Subjective: Patient reports mild neck pain but no acute events overnight.  Objective: Vital signs in last 24 hours: Temp:  [97.5 F (36.4 C)-102.2 F (39 C)] 97.5 F (36.4 C) (11/24 0400) Pulse Rate:  [80-159] 126 (11/24 0600) Resp:  [10-19] 14 (11/24 0600) BP: (101-150)/(58-98) 143/88 (11/24 0600) SpO2:  [93 %-100 %] 98 % (11/24 0600)  Intake/Output from previous day: 11/23 0701 - 11/24 0700 In: 7043.1 [I.V.:4364.6; Blood:1260; IV Piggyback:1418.4] Out: 4980 [Urine:3650; Drains:130; Blood:1200] Intake/Output this shift: No intake/output data recorded.  Neurologic: Paraplegic with no improvement in strength    Lab Results: Lab Results  Component Value Date   WBC 14.7 (H) 10/15/2019   HGB 8.9 (L) 10/15/2019   HCT 25.7 (L) 10/15/2019   MCV 88.3 10/15/2019   PLT 349 10/15/2019   Lab Results  Component Value Date   INR 1.3 (H) 10/14/2019   BMET Lab Results  Component Value Date   NA 137 10/15/2019   K 3.9 10/15/2019   CL 104 10/15/2019   CO2 23 10/15/2019   GLUCOSE 148 (H) 10/15/2019   BUN 11 10/15/2019   CREATININE 0.56 (L) 10/15/2019   CALCIUM 8.0 (L) 10/15/2019    Studies/Results: Dg Thoracic Spine W/swimmers  Result Date: 10/14/2019 CLINICAL DATA:  Thoracic laminectomy for epidural abscess EXAM: THORACIC SPINE - 3 VIEWS; DG C-ARM 1-60 MIN COMPARISON:  MRI from earlier in the same day. FLUOROSCOPY TIME:  Fluoroscopy Time:  6 seconds Radiation Exposure Index (if provided by the fluoroscopic device): Not available Number of Acquired Spot Images: 1 FINDINGS: Lateral radiograph of the cervicothoracic junction was performed for localization for thoracic laminectomy. Surgical retractors and instruments are noted posterior to the C7 and T1 vertebral bodies. IMPRESSION: Intraoperative localization at the cervicothoracic junction. Electronically Signed   By: Inez Catalina M.D.   On: 10/14/2019 23:47   Mr Cervical Spine W Wo Contrast  Result Date: 10/14/2019 CLINICAL  DATA:  Back pain. Bilateral lower extremity weakness. Febrile. EXAM: MRI CERVICAL SPINE WITHOUT AND WITH CONTRAST TECHNIQUE: Multiplanar and multiecho pulse sequences of the cervical spine, to include the craniocervical junction and cervicothoracic junction, were obtained without and with intravenous contrast. CONTRAST:  41mL GADAVIST GADOBUTROL 1 MMOL/ML IV SOLN COMPARISON:  None. FINDINGS: Alignment: Normal alignment. Image quality degraded by motion. Vertebrae: Negative for fracture or mass. No evidence of discitis. There is edema in the facet and surrounding soft tissues on the left at C6-7. Posterior rim enhancing fluid collection posterior to the facet measuring approximately 12 mm likely a soft tissue abscess. Cord: Cord evaluation limited by motion. Ventral epidural rim enhancing fluid collection from C7 into the thoracic spine compatible with epidural abscess. This is a large fluid collection with compression of the thoracic cord. Posterior Fossa, vertebral arteries, paraspinal tissues: Posterior paraspinous rim enhancing fluid collection behind the left C7-T1 facet compatible with septic arthritis an abscess related to the facet joint. There is posterior lateral epidural thickening on the left at this level with a 4 x 10 mm abscess in the lateral epidural space. Disc levels: C2-3: Bilateral facet degeneration causing foraminal stenosis bilaterally. C3-4: Bilateral facet degeneration causing foraminal encroachment bilaterally. Small central disc protrusion. C4-5: Disc degeneration and spondylosis. Left facet degeneration. Bilateral foraminal stenosis and mild spinal stenosis C5-6: Disc degeneration and spondylosis. Mild spinal stenosis and bilateral facets foraminal stenosis due to spurring C6-7: Disc degeneration and spondylosis. Mild spinal stenosis and moderate foraminal stenosis bilaterally C7-T1: Left facet joint effusion with enhancement of the surrounding soft tissues and  a soft tissue abscess  posterior to the left facet joint. Lateral epidural thickening with 4 x 10 mm lateral epidural abscess. IMPRESSION: 1. Very large ventral epidural abscess from C7 into the thoracic spine causing cord compression. 2. Left facet joint septic arthritis at C7-T1 with surrounding soft tissue abscess. 3. Left lateral epidural abscess at C7-T1. 4. 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 5. These results were called by telephone at the time of interpretation on 10/14/2019 at 5:38 pm to ED provider , who verbally acknowledged these results. Electronically Signed   By: Franchot Gallo M.D.   On: 10/14/2019 17:39   Mr Thoracic Spine W Wo Contrast  Result Date: 10/14/2019 CLINICAL DATA:  Back pain.  Bilateral leg weakness.  Febrile. EXAM: MRI THORACIC WITHOUT AND WITH CONTRAST TECHNIQUE: Multiplanar and multiecho pulse sequences of the thoracic spine were obtained without and with intravenous contrast. CONTRAST:  92mL GADAVIST GADOBUTROL 1 MMOL/ML IV SOLN COMPARISON:  None. FINDINGS: MRI THORACIC SPINE FINDINGS Alignment:  Normal alignment. Vertebrae: Negative for fracture or mass.  No evidence of discitis. Cord: Marked cord compression due to a large ventral epidural fluid collection extending from C7-T7. The fluid collection has peripheral enhancement and is most compatible with epidural abscess which appears be arising from the left C7-T1 facet joint which appears infected. The fluid collection occupies approximately 2/3 of the spinal canal diameter with posterior compression of the cord. Paraspinal and other soft tissues: No pleural effusion. C7-T1 facet joint on the left shows abnormal enhancement and a fluid collection extending into the posterior soft tissues compatible with abscess. Remaining thoracic spine soft tissues normal. Disc levels: Disc space narrowing and disc degeneration throughout the upper and midthoracic spine. No focal disc protrusion. IMPRESSION: Very large ventral epidural  abscess extending from C7-T1. There is marked cord compression. The abscess occupies approximately 2/3 of the anterior spinal canal. The abscess appears be arising from septic arthritis involving the left C7-T1 facet joint. No evidence of discitis. These results were called by telephone at the time of interpretation on 10/14/2019 at 5:52 pm to ED provider , who verbally acknowledged these results. Electronically Signed   By: Franchot Gallo M.D.   On: 10/14/2019 17:54   Mr Lumbar Spine W Wo Contrast (assess For Abscess, Cord Compression)  Result Date: 10/14/2019 CLINICAL DATA:  Back pain.  Bilateral leg weakness.  Febrile. EXAM: MRI LUMBAR SPINE WITHOUT AND WITH CONTRAST TECHNIQUE: Multiplanar and multiecho pulse sequences of the lumbar spine were obtained without and with intravenous contrast. CONTRAST:  55mL GADAVIST GADOBUTROL 1 MMOL/ML IV SOLN COMPARISON:  None. FINDINGS: Segmentation:  Normal Alignment:  Mild retrolisthesis L2-3 and L3-4 Vertebrae: Mild compression fracture superior endplate of L4 with mild edema and bone marrow enhancement extending to the right. This may be a recent fracture. No other fracture or mass lesion. Conus medullaris and cauda equina: Conus extends to the L1 level. Conus and cauda equina appear normal. Paraspinal and other soft tissues: Negative for paraspinous mass or adenopathy. No soft tissue abscess or fluid collection. Disc levels: L1-2: Mild degenerative change.  Negative for stenosis L2-3: Disc bulging and moderate facet degeneration causing mild spinal stenosis L3-4: Disc degeneration with disc bulging and endplate spurring. Left foraminal and extraforaminal disc protrusion causing impingement of the left L4 nerve root in the subarticular zone. Moderate facet degeneration and moderate spinal stenosis. L4-5: Asymmetric disc degeneration and spurring on the left. Moderate to severe subarticular and foraminal stenosis on the left due to spurring. Mild spinal  stenosis L5-S1:  Small central annular tear without significant disc protrusion or stenosis. IMPRESSION: 1. Negative for infection in the lumbar spine. 2. 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 Mild fracture superior endplate of L4 with bone marrow enhancement on the right suggestive of a recent fracture. Electronically Signed   By: Franchot Gallo M.D.   On: 10/14/2019 17:46   Dg Chest Port 1 View  Result Date: 10/14/2019 CLINICAL DATA:  Weakness EXAM: PORTABLE CHEST 1 VIEW COMPARISON:  None. FINDINGS: The heart size and mediastinal contours are within normal limits. Both lungs are clear. The visualized skeletal structures are unremarkable. IMPRESSION: No active disease. Electronically Signed   By: Davina Poke M.D.   On: 10/14/2019 11:19   Dg C-arm 1-60 Min  Result Date: 10/14/2019 CLINICAL DATA:  Thoracic laminectomy for epidural abscess EXAM: THORACIC SPINE - 3 VIEWS; DG C-ARM 1-60 MIN COMPARISON:  MRI from earlier in the same day. FLUOROSCOPY TIME:  Fluoroscopy Time:  6 seconds Radiation Exposure Index (if provided by the fluoroscopic device): Not available Number of Acquired Spot Images: 1 FINDINGS: Lateral radiograph of the cervicothoracic junction was performed for localization for thoracic laminectomy. Surgical retractors and instruments are noted posterior to the C7 and T1 vertebral bodies. IMPRESSION: Intraoperative localization at the cervicothoracic junction. Electronically Signed   By: Inez Catalina M.D.   On: 10/14/2019 23:47    Assessment/Plan: Postop day 1 from C7-T7 laminectomy for ventral epidural abscess. Sensation has improved since last night, he is able to feel light touch down to his feet now. Therapies and pain management today.    LOS: 1 day    Ocie Cornfield Ireta Pullman 10/15/2019, 8:32 AM

## 2019-10-15 NOTE — Progress Notes (Signed)
Rehab Admissions Coordinator Note:  Per OT recommendation, patient was screened by Michel Santee for appropriateness for an Inpatient Acute Rehab Consult.  At this time, we are recommending Inpatient Rehab consult.  Please place a rehab consult order if pt would like to be considered.   Michel Santee 10/15/2019, 3:17 PM  I can be reached at  [phone].

## 2019-10-15 NOTE — Progress Notes (Signed)
  Echocardiogram 2D Echocardiogram has been performed.  Ryan Reed 10/15/2019, 11:03 AM

## 2019-10-15 NOTE — Evaluation (Signed)
Occupational Therapy Evaluation Patient Details Name: Ryan Reed MRN: [id number] DOB: [birth year] Today's Date: 10/15/2019    History of Present Illness 68 yo male progressive back pain and rapid progression of numbness and paraplegia. MRI L ventral pepidural abscess C7-T1 down to T7 with cord compression. 11/23 underwent emergent laminectomy  PMH NONe   Clinical Impression   Patient is s/p C7-T1 surgery resulting in functional limitations due to the deficits listed below (see OT problem list). Pt currently presents with paraplegia and decrease static sitting balance balance total (A). Pt able to elevate arms and reports decrease sensation starting at nipple line. Pt with increase numbness and deficits from belly button down. Pt reports sitting in chair to be more comfortable than the bed at this time and able to demonstrate lateral weight shifts by himself in chair. Hoyer pad placed under patient for Rn to lift back to chair. Pt with drop arm chair in room to brace sliding board transfers.  Patient will benefit from skilled OT acutely to increase independence and safety with ADLS to allow discharge Cir.     Follow Up Recommendations  CIR    Equipment Recommendations  3 in 1 bedside commode;Wheelchair (measurements OT);Wheelchair cushion (measurements OT);Hospital bed    Recommendations for Other Services Rehab consult     Precautions / Restrictions Precautions Precautions: Cervical;Back Precaution Comments: Jp drain      Mobility Bed Mobility Overal bed mobility: Needs Assistance Bed Mobility: Rolling;Supine to Sit Rolling: Max assist   Supine to sit: +2 for physical assistance;Max assist;+2 for safety/equipment     General bed mobility comments: pt requires total (A) at EOB. pt using bil Ue to attempt to prop into static sitting.   Transfers                 General transfer comment: squat pivot with pad total +2 to chair . pt could benefit from sliding board  or stedy to block bil LE    Balance Overall balance assessment: Needs assistance Sitting-balance support: Bilateral upper extremity supported;Feet supported Sitting balance-Leahy Scale: Zero                                     ADL either performed or assessed with clinical judgement   ADL Overall ADL's : Needs assistance/impaired Eating/Feeding: Minimal assistance;Sitting   Grooming: Oral care;Wash/dry face;Sitting;Minimal assistance   Upper Body Bathing: Moderate assistance;Sitting   Lower Body Bathing: Total assistance           Toilet Transfer: Squat-pivot;Maximal assistance;+2 for physical assistance;+2 for safety/equipment Toilet Transfer Details (indicate cue type and reason): pt unable to sustain bil LE knee extension so pivot to chair with pad to simulate           General ADL Comments: pt prop sitting eob this session with decrease trunk control noted. pt will need to contine to work on EOB static sitting to progress to static standing. pt will require bil LE blocking so stedy might be a good resource for attempting      Vision Baseline Vision/History: Wears glasses Wears Glasses: Reading only       Perception     Praxis      Pertinent Vitals/Pain Pain Assessment: No/denies pain     Hand Dominance Left   Extremity/Trunk Assessment Upper Extremity Assessment Upper Extremity Assessment: RUE deficits/detail;LUE deficits/detail RUE Deficits / Details: reports unable to touch pad to pad all five  digits RUE Coordination: decreased fine motor LUE Deficits / Details: reports unable to touch pad to pad all five digits LUE Coordination: decreased fine motor;decreased gross motor   Lower Extremity Assessment Lower Extremity Assessment: Generalized weakness;RLE deficits/detail;LLE deficits/detail RLE Sensation: decreased light touch;decreased proprioception LLE Sensation: decreased light touch;decreased proprioception   Cervical / Trunk  Assessment Cervical / Trunk Assessment: Other exceptions(s/p surg)   Communication Communication Communication: No difficulties   Cognition Arousal/Alertness: Awake/alert Behavior During Therapy: WFL for tasks assessed/performed Overall Cognitive Status: Within Functional Limits for tasks assessed                                     General Comments  VSS; educated on pressure relief in chair    Exercises     Shoulder Instructions      Home Living Family/patient expects to be discharged to:: Private residence Living Arrangements: Spouse/significant other Available Help at Discharge: Family Type of Home: House Home Access: Stairs to enter Technical brewer of Steps: 2 Entrance Stairs-Rails: None Home Layout: One level     Bathroom Shower/Tub: Tub/shower unit;Walk-in shower;Curtain(very narrow door into the bathroom)   Bathroom Toilet: Standard     Home Equipment: None   Additional Comments: works Biomedical scientist, 100 pound dog in [employer], 8 chickens, big garden      Prior Functioning/Environment Level of Independence: Independent                 OT Problem List: Decreased strength;Decreased activity tolerance;Decreased coordination;Decreased knowledge of precautions;Decreased range of motion;Decreased knowledge of use of DME or AE;Pain;Impaired balance (sitting and/or standing);Impaired UE functional use      OT Treatment/Interventions: Self-care/ADL training;Therapeutic exercise;Therapeutic activities;Neuromuscular education;Patient/family education;Balance training    OT Goals(Current goals can be found in the care plan section) Acute Rehab OT Goals Patient Stated Goal: to get back to walking OT Goal Formulation: With patient/family Time For Goal Achievement: 10/29/19 Potential to Achieve Goals: Good  OT Frequency: Min 3X/week   Barriers to D/C:            Co-evaluation PT/OT/SLP Co-Evaluation/Treatment: Yes Reason for  Co-Treatment: Complexity of the patient's impairments (multi-system involvement);For patient/therapist safety;To address functional/ADL transfers   OT goals addressed during session: ADL's and self-care;Proper use of Adaptive equipment and DME;Strengthening/ROM      AM-PAC OT "6 Clicks" Daily Activity     Outcome Measure Help from another person eating meals?: A Little Help from another person taking care of personal grooming?: A Lot Help from another person toileting, which includes using toliet, bedpan, or urinal?: A Lot Help from another person bathing (including washing, rinsing, drying)?: A Lot Help from another person to put on and taking off regular upper body clothing?: A Lot Help from another person to put on and taking off regular lower body clothing?: Total 6 Click Score: 12   End of Session Nurse Communication: Mobility status;Precautions;Need for lift equipment  Activity Tolerance: Patient tolerated treatment well Patient left: in chair;with chair alarm set;with family/visitor present;with call bell/phone within reach  OT Visit Diagnosis: Unsteadiness on feet (R26.81);Muscle weakness (generalized) (M62.81)                Time: [id number] OT Time Calculation (min): 35 min Charges:  OT General Charges $OT Visit: 1 Visit OT Evaluation $OT Eval Moderate Complexity: 1 Mod   Brynn, OTR/L  Acute Rehabilitation Services Pager: [phone] Office: [phone] .   Jeri Modena 10/15/2019,  2:53 PM

## 2019-10-15 NOTE — Consult Note (Signed)
NAME:  Ryan Reed, MRN:  [id number], DOB:  [birth year], LOS: 1 ADMISSION DATE:  10/14/2019, CONSULTATION DATE:  10/14/2019 REFERRING MD:  Dr. Ronnald Ramp, CHIEF COMPLAINT:  Epidural abscess   Brief History   68 year old male with no significant past medical history presenting with progressive back back with rapid progression of numbness and paraplegia found to have epidural abscess taken for emergent laminectomy.  EBL 1200 s/p 4 units PRBC.  Extubated in PACU.  Returns to ICU, PCCM consulted for further medical management.   History of present illness   68 year old male with no significant past medical history presenting with progressive back pain with rapid progression of numbness and paraplegia.  Patient is married and works in Biomedical scientist.  Reports he had a "flu like" illness about two weeks ago and as that resolved, he started having back pain which has progressed in severity.  Was seen at a urgent care and treated with muscle relaxants and pain pills.  Non smoker, denies any drug abuse, no history of spinal injections, denies any recent dental work.  He does drink "a few" beers daily but never had problems stopping or withdrawals.  Started having right leg numbness Monday morning with rapid progression of numbness and paraplegia.  MRI showed a large ventral epidural abscess from C7- T1 down to T7 with cord compression.  Labs noted for CRP 12.5 and ESR 110.  Underwent emergent laminectomy by Neurosurgery on 11/23. EBL 1200 s/p 4 units PRBC.  Patient extubated post-operatively and PCCM consulted for further medical management while in ICU.   Past Medical History  No significant history.  Daily beer use  Significant Hospital Events   11/24 Admitted/ OR  Consults:  PCCM  Procedures:  11/24 Emergent C7-T7 posterior decompressive laminectomy, medial facetectomy and foraminotomy, with evacuation of ventral epidural abscess  Significant Diagnostic Tests:  11/24 MRI cervical/ lumbar/ thoracic  >> Very large ventral epidural abscess extending from C7-T1. There is marked cord compression. The abscess occupies approximately 2/3 of the anterior spinal canal. The abscess appears be arising from septic arthritis involving the left C7-T1 facet joint.   No evidence of discitis.  Micro Data:  11/23 BCx 2 >> 11/23 SARS CoV2 >> neg 11/23 UC >> 11/23 epidural abscess >> mod GPCs >> 11/24 MRSA PCR >>  Antimicrobials:  11/24 vanc >> 11/24 cefepime >>  Interim history/subjective:  RN reports episodes of self resolving ?SVT vs atrial tachycardia and possible OSA while sleeping requiring intermittent O2  Objective   Blood pressure 115/72, pulse 84, temperature 98.4 F (36.9 C), temperature source Oral, resp. rate 13, height 6' 2" (1.88 m), weight 90.7 kg, SpO2 99 %.        Intake/Output Summary (Last 24 hours) at 10/15/2019 4098 Last data filed at 10/15/2019 0109 Gross per 24 hour  Intake 6608.02 ml  Output 4050 ml  Net 2558.02 ml   Filed Weights   10/14/19 0826  Weight: 90.7 kg    Examination: General:  Older male lying in bed in NAD HEENT: MM pink/dry, very poor dentition, pupils 4/reactive Neuro: Alert/ oriented x 3, 5/5 bilateral upper extremity grips, flaccid in lower LE, ongoing numbness from nipple line down CV: rr, no murmur PULM:  Non labored, CTA GI: soft, bs active, foley  Extremities: warm/dry, no LE edema  Skin: no rashes  Resolved Hospital Problem list    Assessment & Plan:   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  Denies spinal injection, IVDA.  May be related to very poor dentition.   P:  Per NSGY Ongoing neuro checks Monitor wound vac drainage Follow culture data Continue vanc and cefepime Will order panoramic to look for dental abscess when able  TTE Trend WBC/ fever curve  Pain  management per NSGY- prn morphine, oxy IR, robaxin  Ongoing pulmonary hygiene/ IS  At risk for AKI P:  Check renal panel/ mag now NS w/ 20 meq KCL at 75 ml Strict I/O's / urinary output Replace electrolytes as indicated Avoid nephrotoxic agents, ensure adequate renal perfusion   ?anemia/ ABLA - initial Hgb on CBC 7.4 with MCV 90 (prior to procedure) - EBL in OR 1200 ml s/p 4 units of PRBC P: Check CBC now, if still low, will send anemia panel Transfuse for Hgb <7   ETOH use - daily beer drinker, no hx of DTs/ withdrawals P:  Empiric CIWA with po ativan Daily thiamine/ folic acid    Arrhythmia - ? Atrial tachycardia, unable to capture on EKG yet P:  EKG if able Check electrolytes for K > 4, Mag >2 Metoprolol 25 mg BID with holding parameters   Possible OSA P:  Sat goal >92, prn supplemental O2 as needed Recommend further outpatient sleep study   Best practice:  Diet: NPO for now Pain/Anxiety/Delirium protocol (if indicated): as above VAP protocol (if indicated): n/a DVT prophylaxis: SCDs for now GI prophylaxis: PPI Glucose control: trend on BMP Mobility: BR Code Status: Full  Family Communication: patient updated on plan of care Disposition: Neuro ICU, PCCM will follow for medical issues while in ICU  Labs   CBC: Recent Labs  Lab 10/14/19 0922 10/14/19 2339  WBC 16.2*  --   NEUTROABS 13.8*  --   HGB 7.4* 11.2*  HCT 21.2* 33.0*  MCV 90.6  --   PLT 464*  --     Basic Metabolic Panel: Recent Labs  Lab 10/14/19 0922 10/14/19 2339  NA 132* 136  K 3.4* 3.6  CL 96* 103  CO2 24  --   GLUCOSE 111* 163*  BUN 9 11  CREATININE 0.70 0.60*  CALCIUM 8.5*  --    GFR: Estimated Creatinine Clearance: 104.2 mL/min (A) (by C-G formula based on SCr of 0.6 mg/dL (L)). Recent Labs  Lab 10/14/19 0922 10/14/19 1406  WBC 16.2*  --   LATICACIDVEN  --  0.7    Liver Function Tests: Recent Labs  Lab 10/14/19 0922  AST 29  ALT 39  ALKPHOS 57  BILITOT  0.7  PROT 6.2*  ALBUMIN 2.5*   No results for input(s): LIPASE, AMYLASE in the last 168 hours. No results for input(s): AMMONIA in the last 168 hours.  ABG    Component Value Date/Time   TCO2 26 10/14/2019 2339     Coagulation Profile: Recent Labs  Lab 10/14/19 1817  INR 1.3*    Cardiac Enzymes: No results for input(s): CKTOTAL, CKMB, CKMBINDEX, TROPONINI in the last 168 hours.  HbA1C: No results found for: HGBA1C  CBG: No results for input(s): GLUCAP in the last 168 hours.  Review of Systems:   As per HPI otherwise negative  Past Medical History  He,  has no past medical history on file.   Surgical History   History reviewed. No pertinent surgical history.   Social History      Family History   His family history   is not on file.   Allergies No Known Allergies   Home Medications  Prior to Admission medications   Medication Sig Start Date End Date Taking? Authorizing Provider  HYDROcodone-acetaminophen (NORCO/VICODIN) 5-325 MG tablet Take 1 tablet by mouth every 6 (six) hours as needed for moderate pain.   Yes [provider]  tiZANidine (ZANAFLEX) 2 MG tablet Take 1 mg by mouth every 6 (six) hours as needed for muscle spasms.   Yes [provider]       Brooke Simpson, MSN, AGACNP-BC [facility] 10/15/2019, 3:20 AM          

## 2019-10-15 NOTE — Progress Notes (Signed)
[facility] Progress Note Patient Name: Ryan Reed DOB: 05/28/1951 MRN: [id number]   Date of Service  10/15/2019  HPI/Events of Note  Notified of H/H 8.9/25.7 from 11.2/12. Patient had received 4 units PRBC intraoperatively due to EBL of 1200 cc. No note of bleeding since after surgery. SBP 140s, HR 100s  eICU Interventions  Repeat CBC at noon and reassess if blood transfusion will be needed since patient mildly tachycardic     Intervention Category Intermediate Interventions: Bleeding - evaluation and treatment with blood products  Judd Lien 10/15/2019, 6:39 AM

## 2019-10-15 NOTE — Evaluation (Signed)
Physical Therapy Evaluation Patient Details Name: Ryan Reed MRN: [id number] DOB: 1951/10/27 Today's Date: 10/15/2019   History of Present Illness  68 yo male progressive back pain and rapid progression of numbness and paraplegia. MRI L ventral pepidural abscess C7-T1 down to T7 with cord compression. 11/23 underwent emergent laminectomy  PMH NONe  Clinical Impression  Pt admitted with/for rapid progression of numbness and paraplegia with surgical intervention stated above.  Pt requires total assist for all basic mobility with numbness and plegia below approx T4 level..  Pt currently limited functionally due to the problems listed. ( See problems list.)   Pt will benefit from PT to maximize function and safety in order to get ready for next venue listed below.     Follow Up Recommendations CIR;Supervision/Assistance - 24 hour    Equipment Recommendations  Other (comment);Wheelchair (measurements PT);Wheelchair cushion (measurements PT)(TBA)    Recommendations for Other Services Rehab consult     Precautions / Restrictions Precautions Precautions: Cervical;Back Precaution Comments: Jp drain      Mobility  Bed Mobility Overal bed mobility: Needs Assistance Bed Mobility: Rolling;Supine to Sit Rolling: Max assist   Supine to sit: +2 for physical assistance;Max assist;+2 for safety/equipment     General bed mobility comments: pt requires total (A) at EOB. pt using bil Ue to attempt to prop into static sitting.   Transfers                 General transfer comment: squat pivot with pad total +2 to chair . pt could benefit from sliding board or stedy to block bil LE  Ambulation/Gait             General Gait Details: unable  Stairs            Wheelchair Mobility    Modified Rankin (Stroke Patients Only)       Balance Overall balance assessment: Needs assistance Sitting-balance support: Bilateral upper extremity supported;Feet supported Sitting  balance-Leahy Scale: Zero                                       Pertinent Vitals/Pain Pain Assessment: No/denies pain    Home Living Family/patient expects to be discharged to:: Private residence Living Arrangements: Spouse/significant other Available Help at Discharge: Family Type of Home: House Home Access: Stairs to enter Entrance Stairs-Rails: None Technical brewer of Steps: 2 Home Layout: One level Home Equipment: None Additional Comments: works Biomedical scientist, 100 pound dog in the house, 8 chickens, big garden    Prior Function Level of Independence: Independent               Journalist, newspaper   Dominant Hand: Left    Extremity/Trunk Assessment   Upper Extremity Assessment Upper Extremity Assessment: Defer to OT evaluation    Lower Extremity Assessment Lower Extremity Assessment: LLE deficits/detail;Generalized weakness;RLE deficits/detail RLE Deficits / Details: trace movement in add and IR RLE Sensation: decreased light touch RLE Coordination: decreased gross motor;decreased fine motor LLE Deficits / Details: no voluntary movement noted, minimal spontaneous movement LLE Sensation: decreased light touch LLE Coordination: decreased fine motor;decreased gross motor    Cervical / Trunk Assessment Cervical / Trunk Assessment: Other exceptions(s/p surg)  Communication   Communication: No difficulties  Cognition Arousal/Alertness: Awake/alert Behavior During Therapy: WFL for tasks assessed/performed Overall Cognitive Status: Within Functional Limits for tasks assessed  General Comments General comments (skin integrity, edema, etc.): VSS, educated pt and wife on pressure relief in the chair.    Exercises Other Exercises Other Exercises: LE stretches/warm up prior to mobility   Assessment/Plan    PT Assessment Patient needs continued PT services  PT Problem List Decreased  strength;Decreased activity tolerance;Decreased balance;Decreased mobility;Decreased coordination;Impaired sensation       PT Treatment Interventions Gait training;Functional mobility training;Therapeutic activities;Balance training;Neuromuscular re-education;Patient/family education;DME instruction    PT Goals (Current goals can be found in the Care Plan section)  Acute Rehab PT Goals Patient Stated Goal: to get back to walking PT Goal Formulation: With patient Time For Goal Achievement: 10/29/19 Potential to Achieve Goals: Fair    Frequency Min 4X/week   Barriers to discharge        Co-evaluation PT/OT/SLP Co-Evaluation/Treatment: Yes Reason for Co-Treatment: Complexity of the patient's impairments (multi-system involvement) PT goals addressed during session: Mobility/safety with mobility OT goals addressed during session: ADL's and self-care       AM-PAC PT "6 Clicks" Mobility  Outcome Measure Help needed turning from your back to your side while in a flat bed without using bedrails?: Total Help needed moving from lying on your back to sitting on the side of a flat bed without using bedrails?: Total Help needed moving to and from a bed to a chair (including a wheelchair)?: Total Help needed standing up from a chair using your arms (e.g., wheelchair or bedside chair)?: Total Help needed to walk in hospital room?: Total Help needed climbing 3-5 steps with a railing? : Total 6 Click Score: 6    End of Session   Activity Tolerance: Patient tolerated treatment well Patient left: in chair;with call bell/phone within reach;with chair alarm set;with family/visitor present;Other (comment)(on sky lift pad.) Nurse Communication: Mobility status;Need for lift equipment PT Visit Diagnosis: Muscle weakness (generalized) (M62.81);Other symptoms and signs involving the nervous system (R29.898);Other abnormalities of gait and mobility (R26.89)    Time: [id number] PT Time Calculation  (min) (ACUTE ONLY): 27 min   Charges:   PT Evaluation $PT Eval Moderate Complexity: 1 Mod          10/15/2019  Donnella Sham, PT Acute Rehabilitation Services [phone]  (pager) [phone]  (office)  Tessie Fass Theoplis Garciagarcia 10/15/2019, 6:07 PM

## 2019-10-15 NOTE — Progress Notes (Signed)
D; after gave report to RN, suddenly increasing SVT 140-150's prolonged 30sec. Then back to 80's by itself, Dr. Ronnald Ramp aware of it .around @ 0012

## 2019-10-15 NOTE — Progress Notes (Signed)
Pharmacy Antibiotic Note  Ryan Reed is a 68 y.o. male admitted on 10/14/2019 with epidural abcess.  Pharmacy has been consulted for Vancomycin dosing.  Vancomycin 1 g IV given in ED at 1745  Plan: Vancomycin 1250 mg IV q8h Est AUC 478  Height: 6\' 2"  (188 cm) Weight: 200 lb (90.7 kg) IBW/kg (Calculated) : 82.2  Temp (24hrs), Avg:99.6 F (37.6 C), Min:98.4 F (36.9 C), Max:102.2 F (39 C)  Recent Labs  Lab 10/14/19 0922 10/14/19 1406 10/14/19 2339  WBC 16.2*  --   --   CREATININE 0.70  --  0.60*  LATICACIDVEN  --  0.7  --     Estimated Creatinine Clearance: 104.2 mL/min (A) (by C-G formula based on SCr of 0.6 mg/dL (L)).    No Known Allergies   Caryl Pina 10/15/2019 12:27 AM

## 2019-10-15 NOTE — Consult Note (Signed)
NAME:  Ryan Reed, MRN:  [id number], DOB:  1950/12/18, LOS: 1 ADMISSION DATE:  10/14/2019, CONSULTATION DATE:  10/14/2019 REFERRING MD:  Dr. Ronnald Ramp, CHIEF COMPLAINT:  Epidural abscess   Brief History   68 year old male with no significant past medical history presenting with progressive back back with rapid progression of numbness and paraplegia found to have epidural abscess taken for emergent laminectomy.  EBL 1200 s/p 4 units PRBC.  Extubated in PACU.  Returns to ICU, PCCM consulted for further medical management.   History of present illness   68 year old male with no significant past medical history presenting with progressive back pain with rapid progression of numbness and paraplegia.  Patient is married and works in Biomedical scientist.  Reports he had a "flu like" illness about two weeks ago and as that resolved, he started having back pain which has progressed in severity.  Was seen at a urgent care and treated with muscle relaxants and pain pills.  Non smoker, denies any drug abuse, no history of spinal injections, denies any recent dental work.  He does drink "a few" beers daily but never had problems stopping or withdrawals.  Started having right leg numbness Monday morning with rapid progression of numbness and paraplegia.  MRI showed a large ventral epidural abscess from C7- T1 down to T7 with cord compression.  Labs noted for CRP 12.5 and ESR 110.  Underwent emergent laminectomy by Neurosurgery on 11/23. EBL 1200 s/p 4 units PRBC.  Patient extubated post-operatively and PCCM consulted for further medical management while in ICU.   Past Medical History  No significant history.  Daily beer use  Significant Hospital Events   11/24 Admitted/ OR  Consults:  PCCM  Procedures:  11/24 Emergent C7-T7 posterior decompressive laminectomy, medial facetectomy and foraminotomy, with evacuation of ventral epidural abscess  Significant Diagnostic Tests:  11/24 MRI cervical/ lumbar/ thoracic  >> Very large ventral epidural abscess extending from C7-T1. There is marked cord compression. The abscess occupies approximately 2/3 of the anterior spinal canal. The abscess appears be arising from septic arthritis involving the left C7-T1 facet joint.   No evidence of discitis.  Micro Data:  11/23 BCx 2 >> 11/23 SARS CoV2 >> neg 11/23 UC >> 11/23 epidural abscess >> mod GPCs >> 11/24 MRSA PCR >>  Antimicrobials:  11/24 vanc >> 11/24 cefepime >>  Interim history/subjective:  Complains of tremor and feeling cold. Denies pain.   Objective   Blood pressure (!) 143/88, pulse (!) 126, temperature (!) 97.5 F (36.4 C), temperature source Oral, resp. rate 14, height '6\' 2"'  (1.88 m), weight 90.7 kg, SpO2 98 %.        Intake/Output Summary (Last 24 hours) at 10/15/2019 0737 Last data filed at 10/15/2019 0600 Gross per 24 hour  Intake 7043.08 ml  Output 4980 ml  Net 2063.08 ml   Filed Weights   10/14/19 0826  Weight: 90.7 kg    Examination: General:  Older male lying in bed in NAD HEENT: MM pink/dry, very poor dentition, pupils 4/reactive Neuro: Alert/ oriented x 3, 5/5 bilateral upper extremity grips, flaccid in lower LE, ongoing numbness from nipple line down. Mild resting tremor. 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 Status post C7-T7 decompressive laminectomy. Normocytic anemia of chronic disease Poor dentition Alcohol abuse Possible undiagnosed OSA. Atrial fibrillation.  Usual postoperative care including incentive spirometry. TTE to rule out endocarditis, follow cultures. ID consultation to establish duration of therapy.  Suspect atrial arrhythmia is sympathetically driven, so will start  beta-blocker which may be sufficient to suppress.   Daily Goals Checklist  Pain/Anxiety/Delirium protocol (if indicated): prn oxycodone, robaxin for spasms. CIWA. VAP protocol (if indicated): No  Respiratory support goals: incentive spirometry Blood pressure target: SBP 120-160, Start metoprolol for HR control. DVT prophylaxis: SCD, start UFH tomorrow Nutritional status and feeding goals: Regular diet. GI prophylaxis: none required. Fluid status goals: N/S lock IV Urinary catheter: Per neurosurgery Central lines: PIV Glucose control: euglycemic on no therapy. Mobility/therapy needs: PT/OT ordered  Antibiotic de-escalation: Continue current antibiotics pending cultures and ID consultation. Home medication reconciliation: none relevant. Daily labs: twice weekly labs Code Status: Full code Family Communication: patient informed. Disposition: ICU until HR controlled   Labs   CBC: Recent Labs  Lab 10/14/19 0922 10/14/19 2339 10/15/19 0346  WBC 16.2*  --  14.7*  NEUTROABS 13.8*  --   --   HGB 7.4* 11.2* 8.9*  HCT 21.2* 33.0* 25.7*  MCV 90.6  --  88.3  PLT 464*  --  220    Basic Metabolic Panel: Recent Labs  Lab 10/14/19 0922 10/14/19 2339 10/15/19 0346  NA 132* 136 137  K 3.4* 3.6 3.9  CL 96* 103 104  CO2 24  --  23  GLUCOSE 111* 163* 148*  BUN '9 11 11  ' CREATININE 0.70 0.60* 0.56*  CALCIUM 8.5*  --  8.0*  MG  --   --  2.1  PHOS  --   --  3.8   GFR: Estimated Creatinine Clearance: 104.2 mL/min (A) (by C-G formula based on SCr of 0.56 mg/dL (L)). Recent Labs  Lab 10/14/19 0922 10/14/19 1406 10/15/19 0346  WBC 16.2*  --  14.7*  LATICACIDVEN  --  0.7  --     Liver Function Tests: Recent Labs  Lab 10/14/19 0922 10/15/19 0346  AST 29  --   ALT 39  --   ALKPHOS 57  --   BILITOT 0.7  --   PROT 6.2*  --   ALBUMIN 2.5* 2.1*   No results for input(s): LIPASE, AMYLASE in the last 168 hours. No results for input(s): AMMONIA in the last 168 hours.  ABG     Component Value Date/Time   TCO2 26 10/14/2019 2339     Coagulation Profile: Recent Labs  Lab 10/14/19 1817  INR 1.3*    Cardiac Enzymes: No results for input(s): CKTOTAL, CKMB, CKMBINDEX, TROPONINI in the last 168 hours.  HbA1C: No results found for: HGBA1C  CBG: No results for input(s): GLUCAP in the last 168 hours.  Kipp Brood, MD [facility] ICU Physician [place]  Pager: [phone] Mobile: [phone] After hours: [phone].  10/15/2019, 8:09 AM

## 2019-10-15 NOTE — Consult Note (Signed)
[place] for Infectious Disease    Date of Admission:  10/14/2019     Total days of antibiotics 1   Vancomycin              Reason for Consult: C7-T7 Epidural abscess    Referring Provider: Ronnald Ramp  Primary Care Provider: [facility] [place]    Assessment: Ryan Reed is a 68 y.o. male with no known medical history is POD 1 following emergent cervicothoracic laminectomy for cord compressing epidural abscess. Intraoperative cultures are growing gram positive cocci - will continue vancomycin alone for now and follow for further narrowing; with his history of a bad tooth infection suspect that this may be due to strep or staph. Will check TTE to look for endocarditis as his vertebral infection can present as a sequelae of this. He will require PICC line placement (which we may place after 48 hours of antibiotics to ensure he was not bacteremic) and 8 weeks of IV therapy.   Epidural Abscess = will need ongoing care to follow for return of function as directed by Dr. Ronnald Ramp expertise.  Suspect he may need rehab if delayed or permanent deficit.   Therapeutic Drug Monitoring = monitor vancomycin levels and creatinine while on therapy. CRP / ESR baseline as outlined below  Sed Rate (mm/hr)  Date Value  10/14/2019 110 (H)   CRP (mg/dL)  Date Value  10/14/2019 12.5 (H)      Plan: 1. Place PICC line once blood cultures clear x 48 hours.   2. Continue vancomycin alone 3. Follow micro data for further information and adjust antibiotics.  4. TTE to rule out IE. Hopefully can avoid TEE to prevent further vocal cord dysfunction.  5. Hep C Ab for health maintenance     Active Problems:   Epidural abscess   Paraplegia ([place])   . Chlorhexidine Gluconate Cloth  6 each Topical Daily  . folic acid  1 mg Oral Daily  . LORazepam  0-4 mg Oral Q6H   Followed by  . [START ON 10/17/2019] LORazepam  0-4 mg Oral Q8H  . metoprolol tartrate  25 mg Oral  BID  . multivitamin with minerals  1 tablet Oral Daily  . senna  1 tablet Oral BID  . sodium chloride flush  3 mL Intravenous Q12H  . thiamine  100 mg Oral Daily   Or  . thiamine  100 mg Intravenous Daily    HPI: Ryan Reed is a 68 y.o. male no documented known medical history.   He prestented to the ER on 10/14/19 with after he started to experience rapid and progressively ascending numbness and inability to move lower extremities. He was prior to this having progressively more severe mid back pain where he went to Urgent Care for evaluation on 11/22. He was treated with antispasmodics, anti-inflammatory medications, lidocaine patches and oxycodone without relief.   In the ER 11/23 he was found to have abnormal patellar reflexes and decreased sensation in both legs but R>L. MRI of his T-L spine was ordered and confirmed C7 -T7 epidural abscess with cord compression with rapidly deteriorating symptoms.  He was taken by Dr. Ronnald Ramp urgently to the or for cervicothoracic laminectomy to evacuate abscess; operative note reviewed from procedure revealed large amounts of purulence in the ventral space extending from C7-T1.   Prior to this current problem he did report a respiratory illness with fevers for 2 weeks and thought it  was COVID-19. He does state that 1 month ago he had a bad tooth infection in the left upper mandible that he did not receive care for. Non smoker, denies any drug abuse, no history of spinal injections, denies any recent dental work.  He does drink "a few" beers daily but never had problems stopping or withdrawals.  He is working on moving his lower extremities and can flicker both feet L>R. Can sense pressure but unable to discern sharp/dull.   Sed Rate (mm/hr)  Date Value  10/14/2019 110 (H)   CRP (mg/dL)  Date Value  10/14/2019 12.5 (H)   Blood cultures drawn 10/14/19 no growth thusfar.  Moderate gram positive cocci growing yet to be identified further from back  abscess material.    Review of Systems: Review of Systems  Constitutional: Positive for chills, diaphoresis and fever.  HENT: Negative for sore throat.        Dental pain x 1 month ago  Eyes: Negative for blurred vision.  Respiratory: Positive for cough (tickle in throat from ETT). Negative for shortness of breath.   Cardiovascular: Negative for chest pain and leg swelling.  Gastrointestinal: Negative for abdominal pain, nausea and vomiting.  Genitourinary: Negative for dysuria.       Unable to perceive bladder signaling  Musculoskeletal: Positive for back pain.  Skin: Negative for rash.  Neurological: Positive for sensory change, focal weakness and weakness.    History reviewed. No pertinent past medical history.  Social History   Tobacco Use  . Smoking status: Not on file  Substance Use Topics  . Alcohol use: Not on file  . Drug use: Not on file    History reviewed. No pertinent family history. No Known Allergies  OBJECTIVE: Blood pressure (!) 143/88, pulse (!) 126, temperature 98.2 F (36.8 C), temperature source Oral, resp. rate 14, height '6\' 2"'  (1.88 m), weight 90.7 kg, SpO2 98 %.  Physical Exam Constitutional:      Appearance: Normal appearance.     Comments: Sitting upright in bed with his wife at the bedside.   HENT:     Mouth/Throat:     Mouth: Mucous membranes are moist.     Pharynx: Oropharynx is clear.  Eyes:     General: No scleral icterus.    Pupils: Pupils are equal, round, and reactive to light.  Cardiovascular:     Rate and Rhythm: Normal rate and regular rhythm.     Heart sounds: No murmur.     Comments: He converted from AFib in 120s to NSR in 70s during visit.   Pulmonary:     Effort: Pulmonary effort is normal.     Breath sounds: Normal breath sounds. No wheezing, rhonchi or rales.  Abdominal:     General: Bowel sounds are normal. There is no distension.     Tenderness: There is no abdominal tenderness.  Musculoskeletal:        General:  No swelling or tenderness.     Comments: +pressure sensation LEs. Unable to lift legs off bed. Flickering R>L toes.   Skin:    General: Skin is warm and dry.     Capillary Refill: Capillary refill takes less than 2 seconds.  Neurological:     Mental Status: He is alert and oriented to person, place, and time.     Lab Results Lab Results  Component Value Date   WBC 14.7 (H) 10/15/2019   HGB 8.9 (L) 10/15/2019   HCT 25.7 (L) 10/15/2019   MCV 88.3  10/15/2019   PLT 349 10/15/2019    Lab Results  Component Value Date   CREATININE 0.56 (L) 10/15/2019   BUN 11 10/15/2019   NA 137 10/15/2019   K 3.9 10/15/2019   CL 104 10/15/2019   CO2 23 10/15/2019    Lab Results  Component Value Date   ALT 39 10/14/2019   AST 29 10/14/2019   ALKPHOS 57 10/14/2019   BILITOT 0.7 10/14/2019     Microbiology: Recent Results (from the past 240 hour(s))  SARS CORONAVIRUS 2 (TAT 6-24 HRS) Nasopharyngeal Nasopharyngeal Swab     Status: None   Collection Time: 10/14/19 11:04 AM   Specimen: Nasopharyngeal Swab  Result Value Ref Range Status   SARS Coronavirus 2 NEGATIVE NEGATIVE Final    Comment: (NOTE) SARS-CoV-2 target nucleic acids are NOT DETECTED. The SARS-CoV-2 RNA is generally detectable in upper and lower respiratory specimens during the acute phase of infection. Negative results do not preclude SARS-CoV-2 infection, do not rule out co-infections with other pathogens, and should not be used as the sole basis for treatment or other patient management decisions. Negative results must be combined with clinical observations, patient history, and epidemiological information. The expected result is Negative. Fact Sheet for Patients: SugarRoll.be Fact Sheet for Healthcare Providers: https://www.woods-mathews.com/ This test is not yet approved or cleared by the [place] FDA and  has been authorized for detection and/or diagnosis of SARS-CoV-2 by  FDA under an Emergency Use Authorization (EUA). This EUA will remain  in effect (meaning this test can be used) for the duration of the COVID-19 declaration under Section 56 4(b)(1) of the Act, 21 U.S.C. section 360bbb-3(b)(1), unless the authorization is terminated or revoked sooner. Performed at [facility], [place] [address] [place], [place] [address]   Aerobic/Anaerobic Culture (surgical/deep wound)     Status: None (Preliminary result)   Collection Time: 10/14/19  8:19 PM   Specimen: Abscess  Result Value Ref Range Status   Specimen Description ABSCESS  Final   Special Requests CERVICO THORAIC EPIDURAL PT ON VANC  Final   Gram Stain   Final    FEW WBC PRESENT, PREDOMINANTLY PMN MODERATE GRAM POSITIVE COCCI Performed at [facility], [place] [address], [place], [place] [address]    Culture PENDING  Incomplete   Report Status PENDING  Incomplete  MRSA PCR Screening     Status: None   Collection Time: 10/15/19 12:58 AM   Specimen: Nasopharyngeal  Result Value Ref Range Status   MRSA by PCR NEGATIVE NEGATIVE Final    Comment:        The GeneXpert MRSA Assay (FDA approved for NASAL specimens only), is one component of a comprehensive MRSA colonization surveillance program. It is not intended to diagnose MRSA infection nor to guide or monitor treatment for MRSA infections. Performed at [facility], [place] [address], [place], [place] [address]     Janene Madeira, MSN, NP-C [facility] for Infectious [place] Cell: [phone] Pager: [phone]  10/15/2019 8:58 AM

## 2019-10-15 NOTE — Progress Notes (Signed)
RN notified by x-ray that patient is unable to get orthopantogram until he is able to sit upright in a wheelchair. CCMD notified and aware. RN will continue to monitor.

## 2019-10-16 ENCOUNTER — Inpatient Hospital Stay: Payer: Self-pay

## 2019-10-16 DIAGNOSIS — G4734 Idiopathic sleep related nonobstructive alveolar hypoventilation: Secondary | ICD-10-CM | POA: Diagnosis not present

## 2019-10-16 DIAGNOSIS — G062 Extradural and subdural abscess, unspecified: Secondary | ICD-10-CM | POA: Diagnosis not present

## 2019-10-16 DIAGNOSIS — A491 Streptococcal infection, unspecified site: Secondary | ICD-10-CM

## 2019-10-16 DIAGNOSIS — G8220 Paraplegia, unspecified: Secondary | ICD-10-CM | POA: Diagnosis not present

## 2019-10-16 DIAGNOSIS — I480 Paroxysmal atrial fibrillation: Secondary | ICD-10-CM | POA: Diagnosis not present

## 2019-10-16 MED ORDER — INFLUENZA VAC A&B SA ADJ QUAD 0.5 ML IM PRSY
0.5000 mL | PREFILLED_SYRINGE | INTRAMUSCULAR | Status: DC
  Filled 2019-10-16: qty 0.5

## 2019-10-16 MED ORDER — SODIUM CHLORIDE 0.9 % IV SOLN
2.0000 g | Freq: Two times a day (BID) | INTRAVENOUS | Status: DC
  Administered 2019-10-16 – 2019-10-18 (×4): 2 g via INTRAVENOUS
  Filled 2019-10-16: qty 2
  Filled 2019-10-16: qty 20
  Filled 2019-10-16: qty 2
  Filled 2019-10-16: qty 20
  Filled 2019-10-16 (×2): qty 2

## 2019-10-16 MED ORDER — PNEUMOCOCCAL VAC POLYVALENT 25 MCG/0.5ML IJ INJ
0.5000 mL | INJECTION | INTRAMUSCULAR | Status: DC
  Filled 2019-10-16: qty 0.5

## 2019-10-16 MED ORDER — SODIUM CHLORIDE 0.9 % IV SOLN
2.0000 g | INTRAVENOUS | Status: DC
  Administered 2019-10-16: 2 g via INTRAVENOUS
  Filled 2019-10-16: qty 2

## 2019-10-16 NOTE — Progress Notes (Signed)
Inpatient Rehab Admissions:  Inpatient Rehab Consult received.  I met with pt and his wife at the bedside for rehabilitation assessment and to discuss goals and expectations of an inpatient rehab admission. They show interest in the program and would like to pursue once medically ready. Feel pt is a great candidate for CIR.I will go ahead and begin insurance authorization process for possible admit Friday, pending medical readiness.   Jhonnie Garner, OTR/L  Rehab Admissions Coordinator  [phone] 10/16/2019 1:05 PM

## 2019-10-16 NOTE — Progress Notes (Signed)
Pt tx to unit, VSS, NAD noted, agree w/previous assessment as documented. C/o pain 3/10, PRN Tylenol admin PO per MAR, tol well.

## 2019-10-16 NOTE — Progress Notes (Signed)
Spoke with Production assistant, radio re: PICC placement, might not be done today due to pending blood cultures. Per ID MD on progress notes to wait for 48 hours before placing the PICC. Patient has 3 working PIVs.

## 2019-10-16 NOTE — Progress Notes (Signed)
[facility] for Infectious Disease  Date of Admission:  10/14/2019      Total days of antibiotics 2           ASSESSMENT: ZAKHAI Reed is a 68 y.o. male with C7-T7 epidural abscess POD2 following decompression by Dr. Ronnald Ramp. He slept poorly last night and has had a few elevated temperatures and has had some return of sensation in LEs R>L but no significant recovery of movement yet. He has just started working with PT.   Intraoperative cultures reveal gemella (previously viridans strep family). Will go ahead and narrow to ceftriaxone 2 gm IV daily as this has a very high pattern of sensitivity to penicillins/ceftriaxone. OK to place PICC line tomorrow 11/26 barring blood cultures remain negative.   This organism can cause endocarditis - TTE with normal EF 65%, no structural defects of valves and no evidence of large vegetations or surrogate findings that would be suspicious for endocarditis. Will discuss with Dr. Baxter Flattery to forego TEE in hopes to prevent further vocal cord dysfunction as he already has an indication for prolonged antibiotic treatment.   He has a "hang up" in throat and constantly clearing throat. Would consider asking speech therapy to see him vs ENT to evaluate.   Would also check panorex to evaluate previous dental infection given this was not intervened upon.    PLAN: 1. PICC to be placed 11/26  2. Change vancomycin to ceftriaxone 3. Panorex when able to travel off floor  Dr. Linus Salmons will follow up culture results and help with final recommendations. I will go ahead and schedule an appointment in ID clinic in 4 weeks given he likely will need Rehab for a period of time.  12/23 @ 2:15 pm with Dr. Baxter Flattery - can arrange for MyChart Video Visit for safety.    Principal Problem:   Epidural abscess Active Problems:   Gemella infection   Paraplegia ([place])   . Chlorhexidine Gluconate Cloth  6 each Topical Daily  . folic acid  1 mg Oral Daily  .  [START ON 10/17/2019] influenza vaccine adjuvanted  0.5 mL Intramuscular Tomorrow-1000  . LORazepam  0-4 mg Oral Q6H   Followed by  . [START ON 10/17/2019] LORazepam  0-4 mg Oral Q8H  . metoprolol tartrate  25 mg Oral BID  . multivitamin with minerals  1 tablet Oral Daily  . [START ON 10/17/2019] pneumococcal 23 valent vaccine  0.5 mL Intramuscular Tomorrow-1000  . senna  1 tablet Oral BID  . sodium chloride flush  3 mL Intravenous Q12H  . thiamine  100 mg Oral Daily   Or  . thiamine  100 mg Intravenous Daily    SUBJECTIVE: Didn't get much sleep last night. More sensation returning in RLE>LLE but no return of motor function yet.    Review of Systems: Review of Systems  Constitutional: Positive for fever. Negative for chills, malaise/fatigue and weight loss.  HENT: Negative for sore throat.   Respiratory: Negative for cough and sputum production.   Cardiovascular: Negative for chest pain and leg swelling.  Gastrointestinal: Negative for abdominal pain, diarrhea and vomiting.  Genitourinary: Negative for dysuria and flank pain.  Musculoskeletal: Negative for joint pain, myalgias and neck pain.  Skin: Negative for rash.  Neurological: Negative for dizziness, tingling and headaches.  Psychiatric/Behavioral: Negative for depression and substance abuse. The patient is not nervous/anxious and does not have insomnia.     No Known Allergies  OBJECTIVE: Vitals:  10/16/19 1000 10/16/19 1100 10/16/19 1200 10/16/19 1300  BP: 125/70 (!) 119/52 (!) 106/96   Pulse: 93 74 75 83  Resp: 14 16 (!) 22 19  Temp:   99 F (37.2 C)   TempSrc:   Axillary   SpO2: 98% 100% 98% 97%  Weight:      Height:       Body mass index is 25.68 kg/m.  Physical Exam HENT:     Mouth/Throat:     Mouth: No oral lesions.     Dentition: Normal dentition. No dental caries.  Eyes:     General: No scleral icterus. Cardiovascular:     Rate and Rhythm: Normal rate and regular rhythm.     Heart sounds:  Normal heart sounds.  Pulmonary:     Effort: Pulmonary effort is normal.     Breath sounds: Normal breath sounds.  Abdominal:     General: There is no distension.     Palpations: Abdomen is soft.     Tenderness: There is no abdominal tenderness.  Lymphadenopathy:     Cervical: No cervical adenopathy.  Skin:    General: Skin is warm and dry.     Capillary Refill: Capillary refill takes less than 2 seconds.     Findings: No rash.  Neurological:     Mental Status: He is alert and oriented to person, place, and time.     Sensory: Sensation is intact.     Motor: Weakness present.     Lab Results Lab Results  Component Value Date   WBC 18.4 (H) 10/15/2019   HGB 9.0 (L) 10/15/2019   HCT 25.8 (L) 10/15/2019   MCV 87.5 10/15/2019   PLT 390 10/15/2019    Lab Results  Component Value Date   CREATININE 0.56 (L) 10/15/2019   BUN 11 10/15/2019   NA 137 10/15/2019   K 3.9 10/15/2019   CL 104 10/15/2019   CO2 23 10/15/2019    Lab Results  Component Value Date   ALT 39 10/14/2019   AST 29 10/14/2019   ALKPHOS 57 10/14/2019   BILITOT 0.7 10/14/2019     Microbiology: Recent Results (from the past 240 hour(s))  Urine culture     Status: None   Collection Time: 10/14/19 10:20 AM   Specimen: In/Out Cath Urine  Result Value Ref Range Status   Specimen Description IN/OUT CATH URINE  Final   Special Requests NONE  Final   Culture   Final    NO GROWTH Performed at [facility], [address], [place], [place] [address]    Report Status 10/15/2019 FINAL  Final  SARS CORONAVIRUS 2 (TAT 6-24 HRS) Nasopharyngeal Nasopharyngeal Swab     Status: None   Collection Time: 10/14/19 11:04 AM   Specimen: Nasopharyngeal Swab  Result Value Ref Range Status   SARS Coronavirus 2 NEGATIVE NEGATIVE Final    Comment: (NOTE) SARS-CoV-2 target nucleic acids are NOT DETECTED. The SARS-CoV-2 RNA is generally detectable in upper and lower respiratory specimens during the acute phase of  infection. Negative results do not preclude SARS-CoV-2 infection, do not rule out co-infections with other pathogens, and should not be used as the sole basis for treatment or other patient management decisions. Negative results must be combined with clinical observations, patient history, and epidemiological information. The expected result is Negative. Fact Sheet for Patients: SugarRoll.be Fact Sheet for Healthcare Providers: https://www.woods-mathews.com/ This test is not yet approved or cleared by the [place] FDA and  has  been authorized for detection and/or diagnosis of SARS-CoV-2 by FDA under an Emergency Use Authorization (EUA). This EUA will remain  in effect (meaning this test can be used) for the duration of the COVID-19 declaration under Section 56 4(b)(1) of the Act, 21 U.S.C. section 360bbb-3(b)(1), unless the authorization is terminated or revoked sooner. Performed at [facility], [place] [address] [place], [place] [address]   Blood culture (routine x 2)     Status: None (Preliminary result)   Collection Time: 10/14/19  2:06 PM   Specimen: BLOOD  Result Value Ref Range Status   Specimen Description BLOOD RIGHT ANTECUBITAL  Final   Special Requests   Final    BOTTLES DRAWN AEROBIC AND ANAEROBIC Blood Culture adequate volume   Culture   Final    NO GROWTH 2 DAYS Performed at [facility], [place] [address] [place], [place] [address]    Report Status PENDING  Incomplete  Blood culture (routine x 2)     Status: None (Preliminary result)   Collection Time: 10/14/19  2:10 PM   Specimen: BLOOD RIGHT WRIST  Result Value Ref Range Status   Specimen Description BLOOD RIGHT WRIST  Final   Special Requests   Final    BOTTLES DRAWN AEROBIC AND ANAEROBIC Blood Culture adequate volume   Culture   Final    NO GROWTH 2 DAYS Performed at [facility], [place] [address] [place], [place] [address]    Report Status  PENDING  Incomplete  Aerobic/Anaerobic Culture (surgical/deep wound)     Status: None (Preliminary result)   Collection Time: 10/14/19  8:19 PM   Specimen: Abscess  Result Value Ref Range Status   Specimen Description ABSCESS  Final   Special Requests CERVICO THORAIC EPIDURAL PT ON VANC  Final   Gram Stain   Final    FEW WBC PRESENT, PREDOMINANTLY PMN MODERATE GRAM POSITIVE COCCI Performed at [facility], [place] [address] [place], [place] [address]    Culture   Final    ABUNDANT GEMELLA MORBILLORUM Standardized susceptibility testing for this organism is not available. NO ANAEROBES ISOLATED; CULTURE IN PROGRESS FOR 5 DAYS    Report Status PENDING  Incomplete  MRSA PCR Screening     Status: None   Collection Time: 10/15/19 12:58 AM   Specimen: Nasopharyngeal  Result Value Ref Range Status   MRSA by PCR NEGATIVE NEGATIVE Final    Comment:        The GeneXpert MRSA Assay (FDA approved for NASAL specimens only), is one component of a comprehensive MRSA colonization surveillance program. It is not intended to diagnose MRSA infection nor to guide or monitor treatment for MRSA infections. Performed at [facility], [place] [address], [place], [place] [address]      Janene Madeira, MSN, NP-C [place] for Infectious Disease [email or web address] Pager: [phone] Office: [phone] [place]: [phone]

## 2019-10-16 NOTE — Progress Notes (Signed)
Subjective: Patient reports not much pain.  He describes some return of sensation from the chest down but no movement.  Times he feels like he can move his feet slightly.  Objective: Vital signs in last 24 hours: Temp:  [99 F (37.2 C)-100.8 F (38.2 C)] 99.1 F (37.3 C) (11/25 0800) Pulse Rate:  [65-90] 87 (11/25 0900) Resp:  [11-21] 12 (11/25 0900) BP: (88-143)/(55-81) 129/62 (11/25 0900) SpO2:  [95 %-100 %] 97 % (11/25 0900)  Intake/Output from previous day: 11/24 0701 - 11/25 0700 In: 2462.7 [P.O.:1200; I.V.:282.7; IV Piggyback:980] Out: 1500 [Urine:1220; Drains:280] Intake/Output this shift: Total I/O In: 271.6 [P.O.:240; I.V.:29.9; IV Piggyback:1.7] Out: 175 [Urine:175]  On physical exam he has intact sensation to gross touch from the feet to the chest which is an improvement from preop, but he still has no volitional movement in the lower extremities.  He can feel me tugging on the Foley catheter.  Lab Results: Lab Results  Component Value Date   WBC 18.4 (H) 10/15/2019   HGB 9.0 (L) 10/15/2019   HCT 25.8 (L) 10/15/2019   MCV 87.5 10/15/2019   PLT 390 10/15/2019   Lab Results  Component Value Date   INR 1.3 (H) 10/14/2019   BMET Lab Results  Component Value Date   NA 137 10/15/2019   K 3.9 10/15/2019   CL 104 10/15/2019   CO2 23 10/15/2019   GLUCOSE 148 (H) 10/15/2019   BUN 11 10/15/2019   CREATININE 0.56 (L) 10/15/2019   CALCIUM 8.0 (L) 10/15/2019    Studies/Results: Dg Thoracic Spine W/swimmers  Result Date: 10/14/2019 CLINICAL DATA:  Thoracic laminectomy for epidural abscess EXAM: THORACIC SPINE - 3 VIEWS; DG C-ARM 1-60 MIN COMPARISON:  MRI from earlier in the same day. FLUOROSCOPY TIME:  Fluoroscopy Time:  6 seconds Radiation Exposure Index (if provided by the fluoroscopic device): Not available Number of Acquired Spot Images: 1 FINDINGS: Lateral radiograph of the cervicothoracic junction was performed for localization for thoracic laminectomy.  Surgical retractors and instruments are noted posterior to the C7 and T1 vertebral bodies. IMPRESSION: Intraoperative localization at the cervicothoracic junction. Electronically Signed   By: Inez Catalina M.D.   On: 10/14/2019 23:47   Mr Cervical Spine W Wo Contrast  Result Date: 10/14/2019 CLINICAL DATA:  Back pain. Bilateral lower extremity weakness. Febrile. EXAM: MRI CERVICAL SPINE WITHOUT AND WITH CONTRAST TECHNIQUE: Multiplanar and multiecho pulse sequences of the cervical spine, to include the craniocervical junction and cervicothoracic junction, were obtained without and with intravenous contrast. CONTRAST:  37mL GADAVIST GADOBUTROL 1 MMOL/ML IV SOLN COMPARISON:  None. FINDINGS: Alignment: Normal alignment. Image quality degraded by motion. Vertebrae: Negative for fracture or mass. No evidence of discitis. There is edema in the facet and surrounding soft tissues on the left at C6-7. Posterior rim enhancing fluid collection posterior to the facet measuring approximately 12 mm likely a soft tissue abscess. Cord: Cord evaluation limited by motion. Ventral epidural rim enhancing fluid collection from C7 into the thoracic spine compatible with epidural abscess. This is a large fluid collection with compression of the thoracic cord. Posterior Fossa, vertebral arteries, paraspinal tissues: Posterior paraspinous rim enhancing fluid collection behind the left C7-T1 facet compatible with septic arthritis an abscess related to the facet joint. There is posterior lateral epidural thickening on the left at this level with a 4 x 10 mm abscess in the lateral epidural space. Disc levels: C2-3: Bilateral facet degeneration causing foraminal stenosis bilaterally. C3-4: Bilateral facet degeneration causing foraminal encroachment bilaterally. Small  central disc protrusion. C4-5: Disc degeneration and spondylosis. Left facet degeneration. Bilateral foraminal stenosis and mild spinal stenosis C5-6: Disc degeneration and  spondylosis. Mild spinal stenosis and bilateral facets foraminal stenosis due to spurring C6-7: Disc degeneration and spondylosis. Mild spinal stenosis and moderate foraminal stenosis bilaterally C7-T1: Left facet joint effusion with enhancement of the surrounding soft tissues and a soft tissue abscess posterior to the left facet joint. Lateral epidural thickening with 4 x 10 mm lateral epidural abscess. IMPRESSION: 1. Very large ventral epidural abscess from C7 into the thoracic spine causing cord compression. 2. Left facet joint septic arthritis at C7-T1 with surrounding soft tissue abscess. 3. Left lateral epidural abscess at C7-T1. 4. 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 5. These results were called by telephone at the time of interpretation on 10/14/2019 at 5:38 pm to ED provider , who verbally acknowledged these results. Electronically Signed   By: Franchot Gallo M.D.   On: 10/14/2019 17:39   Mr Thoracic Spine W Wo Contrast  Result Date: 10/14/2019 CLINICAL DATA:  Back pain.  Bilateral leg weakness.  Febrile. EXAM: MRI THORACIC WITHOUT AND WITH CONTRAST TECHNIQUE: Multiplanar and multiecho pulse sequences of the thoracic spine were obtained without and with intravenous contrast. CONTRAST:  5mL GADAVIST GADOBUTROL 1 MMOL/ML IV SOLN COMPARISON:  None. FINDINGS: MRI THORACIC SPINE FINDINGS Alignment:  Normal alignment. Vertebrae: Negative for fracture or mass.  No evidence of discitis. Cord: Marked cord compression due to a large ventral epidural fluid collection extending from C7-T7. The fluid collection has peripheral enhancement and is most compatible with epidural abscess which appears be arising from the left C7-T1 facet joint which appears infected. The fluid collection occupies approximately 2/3 of the spinal canal diameter with posterior compression of the cord. Paraspinal and other soft tissues: No pleural effusion. C7-T1 facet joint on the left shows abnormal enhancement  and a fluid collection extending into the posterior soft tissues compatible with abscess. Remaining thoracic spine soft tissues normal. Disc levels: Disc space narrowing and disc degeneration throughout the upper and midthoracic spine. No focal disc protrusion. IMPRESSION: Very large ventral epidural abscess extending from C7-T1. There is marked cord compression. The abscess occupies approximately 2/3 of the anterior spinal canal. The abscess appears be arising from septic arthritis involving the left C7-T1 facet joint. No evidence of discitis. These results were called by telephone at the time of interpretation on 10/14/2019 at 5:52 pm to ED provider , who verbally acknowledged these results. Electronically Signed   By: Franchot Gallo M.D.   On: 10/14/2019 17:54   Mr Lumbar Spine W Wo Contrast (assess For Abscess, Cord Compression)  Result Date: 10/14/2019 CLINICAL DATA:  Back pain.  Bilateral leg weakness.  Febrile. EXAM: MRI LUMBAR SPINE WITHOUT AND WITH CONTRAST TECHNIQUE: Multiplanar and multiecho pulse sequences of the lumbar spine were obtained without and with intravenous contrast. CONTRAST:  41mL GADAVIST GADOBUTROL 1 MMOL/ML IV SOLN COMPARISON:  None. FINDINGS: Segmentation:  Normal Alignment:  Mild retrolisthesis L2-3 and L3-4 Vertebrae: Mild compression fracture superior endplate of L4 with mild edema and bone marrow enhancement extending to the right. This may be a recent fracture. No other fracture or mass lesion. Conus medullaris and cauda equina: Conus extends to the L1 level. Conus and cauda equina appear normal. Paraspinal and other soft tissues: Negative for paraspinous mass or adenopathy. No soft tissue abscess or fluid collection. Disc levels: L1-2: Mild degenerative change.  Negative for stenosis L2-3: Disc bulging and moderate facet degeneration causing mild  spinal stenosis L3-4: Disc degeneration with disc bulging and endplate spurring. Left foraminal and extraforaminal disc protrusion  causing impingement of the left L4 nerve root in the subarticular zone. Moderate facet degeneration and moderate spinal stenosis. L4-5: Asymmetric disc degeneration and spurring on the left. Moderate to severe subarticular and foraminal stenosis on the left due to spurring. Mild spinal stenosis L5-S1: Small central annular tear without significant disc protrusion or stenosis. IMPRESSION: 1. Negative for infection in the lumbar spine. 2. 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 Mild fracture superior endplate of L4 with bone marrow enhancement on the right suggestive of a recent fracture. Electronically Signed   By: Franchot Gallo M.D.   On: 10/14/2019 17:46   Dg Chest Port 1 View  Result Date: 10/14/2019 CLINICAL DATA:  Weakness EXAM: PORTABLE CHEST 1 VIEW COMPARISON:  None. FINDINGS: The heart size and mediastinal contours are within normal limits. Both lungs are clear. The visualized skeletal structures are unremarkable. IMPRESSION: No active disease. Electronically Signed   By: Davina Poke M.D.   On: 10/14/2019 11:19   Dg C-arm 1-60 Min  Result Date: 10/14/2019 CLINICAL DATA:  Thoracic laminectomy for epidural abscess EXAM: THORACIC SPINE - 3 VIEWS; DG C-ARM 1-60 MIN COMPARISON:  MRI from earlier in the same day. FLUOROSCOPY TIME:  Fluoroscopy Time:  6 seconds Radiation Exposure Index (if provided by the fluoroscopic device): Not available Number of Acquired Spot Images: 1 FINDINGS: Lateral radiograph of the cervicothoracic junction was performed for localization for thoracic laminectomy. Surgical retractors and instruments are noted posterior to the C7 and T1 vertebral bodies. IMPRESSION: Intraoperative localization at the cervicothoracic junction. Electronically Signed   By: Inez Catalina M.D.   On: 10/14/2019 23:47   [place] Ekg Site Rite  Result Date: 10/16/2019 If Site Rite image not attached, placement  could not be confirmed due to current cardiac rhythm.   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  He has regained some sensation but no motor function at this point.  Continue antibiotics as per infectious disease.  Continue therapy.  CIR consult.  Will need PICC line when okay with infectious disease.  Cultures pending.  Estimated body mass index is 25.68 kg/m as calculated from the following:   Height as of this encounter: 6\' 2"  (1.88 m).   Weight as of this encounter: 90.7 kg.    LOS: 2 days    [name] 10/16/2019, 10:12 AM    '

## 2019-10-16 NOTE — PMR Pre-admission (Signed)
PMR Admission Coordinator Pre-Admission Assessment  Patient: Ryan Reed is an 68 y.o., male MRN: [id number] DOB: 23-Jul-1951 Height: _0  (188 cm) Weight: 90.7 kg              Insurance Information HMO: yes    PPO:      PCP:      IPA:      80/20:      OTHER:  PRIMARY: U[employer] Medicare      Policy#: [id number]      Subscriber: Patient CM Name: [name]      Phone#: [phone]     Fax#: [phone] Pre-Cert#: [id number]      Employer:  [employer] provided by [name] on 11/27 for admit to CIR with start date 11/27. Pt is approved for 7 days (11/27-12/3). Clinical updates due to (f): [phone] Benefits:  Phone #: online     Name: [email or web address] Eff. Date: 11/21/2018-11/21/2019     Deduct:does not have ($0)      Out of Pocket Max: $3,600 ($0 met)      Life Max:  CIR: $295/day co-pay for days 1-5, $0/day co-pay for days 6+      SNF: $0/day co-pay for days 1-20, $160/day co-pay for days 21-43, $0/day co-pay for days 44-100; limited to 100 days/cal yr Outpatient: limited by medical necessity     Co-Pay: $30/visit Home Health: 100% coverage; limited by medical necessity      Co-Pay: 0% co-insurance DME: 80% coverage      Co-Pay: 20% co-insurance Providers:  SECONDARY: None      Policy#:       Subscriber:  CM Name:       Phone#:      Fax#:  Pre-Cert#:       Employer:  Benefits:  Phone #:      Name:  Eff. Date:      Deduct:       Out of Pocket Max:      Life Max:  CIR:       SNF:  Outpatient:      Co-Pay:  Home Health:       Co-Pay:  DME:      Co-Pay:   Medicaid Application Date:       Case Manager:  Disability Application Date:       Case Worker:   The "Data Collection Information Summary" for patients in Inpatient Rehabilitation Facilities with attached "Privacy Act [place] Records" was provided and verbally reviewed with: Patient  Emergency Contact Information Contact Information    Name Relation Home Work [place] Spouse [phone]  [phone]      Current Medical History  Patient Admitting Diagnosis:Impaired mobility and ADLs secondary to 7-T1 epidural abscess   History of Present Illness: Pt is a 68 yo Male with no pertinent medical history who was admitted to [facility] with an inability to move his legs and numbness to LLE. Pt was had been having mid back pain and had been having flu-like symptoms for approximately 2 weeks. He was beginning to have some numbness in his arms. 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 The patient was taken emergently to the OR for a C7-T7 posterior decompressive laminectomy, medial facetectomy and foraminotomy, with evacuation of ventral epidural abscess. Suspicion is abscess originated from poor dentition/tooth infection that he did not receive care for. Post op pt was placed on broad spectrum  antibiotics which was narrowed to ceftriaxone until there are sensitiveities. Anticipate pt will be antibiotics for 8 weeks. Pt has been evaluated by therapies with recommendation for CIR. Pt is to admit to CIR on 10/18/2019   Glasgow Coma Scale Score: 15  Past Medical History  History reviewed. No pertinent past medical history.  Family History  family history is not on file.  Prior Rehab/Hospitalizations:  Has the patient had prior rehab or hospitalizations prior to admission? No  Has the patient had major surgery during 100 days prior to admission? Yes  Current Medications   Current Facility-Administered Medications:  .  0.9 %  sodium chloride infusion, 250 mL, Intravenous, Continuous, Kipp Brood, MD, Stopped at 10/15/19 0107 .  0.9 %  sodium chloride infusion, , Intravenous, PRN, Kipp Brood, MD, Stopped at 10/16/19 1807 .  acetaminophen (TYLENOL) tablet 650 mg, 650 mg, Oral, Q4H PRN, 650 mg at 10/17/19 1906 **OR** acetaminophen (TYLENOL) suppository 650 mg, 650  mg, Rectal, Q4H PRN, Agarwala, Ravi, MD .  cefTRIAXone (ROCEPHIN) 2 g in sodium chloride 0.9 % 100 mL IVPB, 2 g, Intravenous, Q12H, Carlyle Basques, MD, Last Rate: 200 mL/hr at 10/18/19 1123, 2 g at 10/18/19 1123 .  Chlorhexidine Gluconate Cloth 2 % PADS 6 each, 6 each, Topical, Daily, Kipp Brood, MD, 6 each at 10/18/19 1100 .  influenza vaccine adjuvanted (FLUAD) injection 0.5 mL, 0.5 mL, Intramuscular, Tomorrow-1000, [name], MD .  menthol-cetylpyridinium (CEPACOL) lozenge 3 mg, 1 lozenge, Oral, PRN **OR** phenol (CHLORASEPTIC) mouth spray 1 spray, 1 spray, Mouth/Throat, PRN, Agarwala, Ravi, MD .  methocarbamol (ROBAXIN) tablet 500 mg, 500 mg, Oral, Q6H PRN, 500 mg at 10/18/19 1113 **OR** methocarbamol (ROBAXIN) 500 mg in dextrose 5 % 50 mL IVPB, 500 mg, Intravenous, Q6H PRN, Agarwala, Ravi, MD .  metoprolol tartrate (LOPRESSOR) tablet 25 mg, 25 mg, Oral, BID, Agarwala, Ravi, MD, 25 mg at 10/18/19 1130 .  ondansetron (ZOFRAN) tablet 4 mg, 4 mg, Oral, Q6H PRN **OR** ondansetron (ZOFRAN) injection 4 mg, 4 mg, Intravenous, Q6H PRN, Agarwala, Ravi, MD .  oxyCODONE (Oxy IR/ROXICODONE) immediate release tablet 5 mg, 5 mg, Oral, Q3H PRN, Agarwala, Ravi, MD .  pneumococcal 23 valent vaccine (PNEUMOVAX-23) injection 0.5 mL, 0.5 mL, Intramuscular, Tomorrow-1000, [name], MD .  senna [facility]) tablet 8.6 mg, 1 tablet, Oral, BID, Agarwala, Ravi, MD, 8.6 mg at 10/17/19 2151 .  sodium chloride flush (NS) 0.9 % injection 10-40 mL, 10-40 mL, Intracatheter, Q12H, [name], MD, 10 mL at 10/18/19 1100 .  sodium chloride flush (NS) 0.9 % injection 10-40 mL, 10-40 mL, Intracatheter, PRN, [name], MD .  sodium chloride flush (NS) 0.9 % injection 3 mL, 3 mL, Intravenous, Q12H, Agarwala, Ravi, MD, 3 mL at 10/18/19 1100 .  sodium chloride flush (NS) 0.9 % injection 3 mL, 3 mL, Intravenous, PRN, Kipp Brood, MD  Patients Current Diet:  Diet Order            Diet regular Room service  appropriate? 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 Pt was able to drive PTA. Pt was independent PTA  Prior Functional  Level Prior Function Level of Independence: Independent  Self Care: Did the patient need help bathing, dressing, using the toilet or eating?  Independent  Indoor Mobility: Did the patient need assistance with walking from room to room (with or without device)? Independent  Stairs: Did the patient need assistance with internal or external stairs (with or without device)? Independent  Functional Cognition: Did the patient need help planning regular tasks such as shopping or remembering to take medications? Independent  Home Assistive Devices / Equipment Home Assistive Devices/Equipment: None Home Equipment: None  Prior Device Use: Indicate devices/aids used by the patient prior to current illness, exacerbation or injury? None of the above  Current Functional Level Cognition  Overall Cognitive Status: Within Functional Limits for tasks assessed Orientation Level: Oriented X4    Extremity Assessment (includes Sensation/Coordination)  Upper Extremity Assessment: Defer to OT evaluation RUE Deficits / Details: reports unable to touch pad to pad all five digits RUE Coordination: decreased fine motor LUE Deficits / Details: reports unable to touch pad to pad all five digits LUE Coordination: decreased fine motor, decreased gross motor  Lower Extremity Assessment: LLE deficits/detail, Generalized weakness, RLE deficits/detail RLE Deficits / Details: trace movement in add and IR RLE Sensation: decreased light touch RLE  Coordination: decreased gross motor, decreased fine motor LLE Deficits / Details: no voluntary movement noted, minimal spontaneous movement LLE Sensation: decreased light touch LLE Coordination: decreased fine motor, decreased gross motor    ADLs  Overall ADL's : Needs assistance/impaired Eating/Feeding: Minimal assistance, Sitting Grooming: Oral care, Wash/dry face, Sitting, Minimal assistance Upper Body Bathing: Moderate assistance, Sitting Lower Body Bathing: Total assistance Toilet Transfer: Squat-pivot, Maximal assistance, +2 for physical assistance, +2 for safety/equipment Toilet Transfer Details (indicate cue type and reason): pt unable to sustain bil LE knee extension so pivot to chair with pad to simulate General ADL Comments: pt prop sitting eob this session with decrease trunk control noted. pt will need to contine to work on EOB static sitting to progress to static standing. pt will require bil LE blocking so stedy might be a good resource for attempting     Mobility  Overal bed mobility: Needs Assistance Bed Mobility: Rolling, Supine to Sit Rolling: Max assist Supine to sit: +2 for physical assistance, Max assist, +2 for safety/equipment General bed mobility comments: pt requires total (A) at EOB. pt using bil Ue to attempt to prop into static sitting.     Transfers  General transfer comment: squat pivot with pad total +2 to chair . pt could benefit from sliding board or stedy to block bil LE    Ambulation / Gait / Stairs / Wheelchair Mobility  Ambulation/Gait General Gait Details: unable    Posture / Balance Dynamic Sitting Balance Sitting balance - Comments: sat EOB propped up on UE's and with min/mod assist Balance Overall balance assessment: Needs assistance Sitting-balance support: Bilateral upper extremity supported, Feet supported Sitting balance-Leahy Scale: Poor Sitting balance - Comments: sat EOB propped up on UE's and with min/mod assist    Special  needs/care consideration BiPAP/CPAP: no CPM: no Continuous Drip IV: ceftriaxone Dialysis: no        Days: no Life Vest: no Oxygen: no Special Bed: no Trach Size: no Wound Vac (area): no      Location: no Skin: surgical incision to the back,  2 posterior hemovac drains (10 Fr.)-Per Dr. Ellene Route, he was planning to pull drains on day of admit to rehab.       Bowel mgmt:incontient, last  BM: 10/10/2019 Bladder mgmt: incontinent, 16 Fr. Urethral catheter Diabetic mgmt: no Behavioral consideration : no Chemo/radiation : no     Previous Home Environment (from acute therapy documentation) Living Arrangements: Spouse/significant other Available Help at Discharge: Family Type of Home: House Home Layout: One level Home Access: Stairs to enter Entrance Stairs-Rails: None Entrance Stairs-Number of Steps: 2 Bathroom Shower/Tub: Tub/shower unit, Walk-in shower, Curtain(very narrow door into the bathroom) Biochemist, clinical: [place]: No Additional Comments: works Biomedical scientist, 100 pound dog in the house, 8 chickens, big garden  Discharge [place] for Discharge Living Setting: Patient's home, Lives with (comment)(wife) Type of Home at Discharge: [place]: One level Discharge Home Access: Stairs to enter Entrance Stairs-Rails: None Entrance Stairs-Number of Steps: 2 Discharge Bathroom Shower/Tub: Tub/shower unit, Walk-in shower Discharge Bathroom Toilet: Handicapped height Discharge Bathroom Accessibility: Yes How Accessible: Accessible via walker Does the patient have any problems obtaining your medications?: No  Social/Family/Support Systems Patient Roles: Spouse, Other (Comment)(full time employee) Contact Information: wife: Sharlot Gowda [phone] Anticipated Caregiver: wife-and has grown son and daughter that live nearby Anticipated Caregiver's Contact Information: see above Ability/Limitations of Caregiver: Min/Mod A  Caregiver  Availability: 24/7 Discharge Plan Discussed with Primary Caregiver: Yes Is Caregiver In Agreement with Plan?: Yes Does Caregiver/Family have Issues with Lodging/Transportation while Pt is in Rehab?: No   Goals/Additional Needs Patient/Family Goal for Rehab: PT/OT: Min A; SLP: Independent Expected length of stay: 16-20 days Cultural Considerations: Christian Dietary Needs: regular diet, thin liquids  Equipment Needs: TBD Special Service Needs: SCI team Pt/Family Agrees to Admission and willing to participate: Yes Program Orientation Provided & Reviewed with Pt/Caregiver Including Roles  & Responsibilities: Yes(pt and wife)  Barriers to Discharge: Home environment access/layout, IV antibiotics, Neurogenic Bowel & Bladder  Barriers to Discharge Comments: bowel and bladder issues; wife open to ramp; will need IV antibiotics at DC   Decrease burden of Care through IP rehab admission: NA   Possible need for SNF placement upon discharge:Not anticipated; pt and family understand the goal is to DC home after CIR stay. Pt has good family support and a wife who is willing and able to physically assist. Family is open to environmental accommodations (ramp) as needed.    Patient Condition: This patient's medical and functional status has changed since the consult dated: 10/16/2019 in which the Rehabilitation Physician determined and documented that the patient's condition is appropriate for intensive rehabilitative care in an inpatient rehabilitation facility. See "History of Present Illness" (above) for medical update. Functional changes are: none; pt has remained at the same functional level since consult note placed. Patient's medical and functional status update has been discussed with the Rehabilitation physician and patient remains appropriate for inpatient rehabilitation. Will admit to inpatient rehab today.  Preadmission Screen Completed By:  Raechel Ache, OT, 10/18/2019 1:12  PM ______________________________________________________________________   Discussed status with Dr. Dagoberto Ligas on 10/18/2019 at 1:12PM and received approval for admission today.  Admission Coordinator:  Raechel Ache, time 1:12 PM/Date 10/18/2019

## 2019-10-16 NOTE — Progress Notes (Signed)
Physical Therapy Treatment Patient Details Name: Ryan Reed MRN: [id number] DOB: 1951-07-05 Today's Date: 10/16/2019    History of Present Illness 68 yo male progressive back pain and rapid progression of numbness and paraplegia. MRI L ventral pepidural abscess C7-T1 down to T7 with cord compression. 11/23 underwent emergent laminectomy  PMH NONe    PT Comments    Pt needed encouragement today he was so exhausted, even after an afternoon of rest.  Emphasis on there ex, transition to EOB, balance at EOB.   Follow Up Recommendations  CIR;Supervision/Assistance - 24 hour     Equipment Recommendations  Other (comment);Wheelchair (measurements PT);Wheelchair cushion (measurements PT)(TBA)    Recommendations for Other Services Rehab consult     Precautions / Restrictions Precautions Precautions: Cervical;Back Precaution Comments: Jp drain    Mobility  Bed Mobility Overal bed mobility: Needs Assistance Bed Mobility: Rolling;Supine to Sit Rolling: Max assist   Supine to sit: +2 for physical assistance;Max assist;+2 for safety/equipment     General bed mobility comments: pt requires total (A) at EOB. pt using bil Ue to attempt to prop into static sitting.   Transfers                 General transfer comment: squat pivot with pad total +2 to chair . pt could benefit from sliding board or stedy to block bil LE  Ambulation/Gait             General Gait Details: unable   Stairs             Wheelchair Mobility    Modified Rankin (Stroke Patients Only)       Balance Overall balance assessment: Needs assistance Sitting-balance support: Bilateral upper extremity supported;Feet supported Sitting balance-Leahy Scale: Poor Sitting balance - Comments: sat EOB propped up on UE's and with min/mod assist                                    Cognition Arousal/Alertness: Awake/alert Behavior During Therapy: WFL for tasks  assessed/performed Overall Cognitive Status: Within Functional Limits for tasks assessed                                        Exercises Other Exercises Other Exercises: LE stretches/warm up prior to mobility Other Exercises: Bil bicep/tricep presses x10 reps bil    General Comments        Pertinent Vitals/Pain Pain Assessment: No/denies pain    Home Living                      Prior Function            PT Goals (current goals can now be found in the care plan section) Acute Rehab PT Goals Patient Stated Goal: to get back to walking PT Goal Formulation: With patient Time For Goal Achievement: 10/29/19 Potential to Achieve Goals: Fair    Frequency    Min 4X/week      PT Plan Current plan remains appropriate    Co-evaluation              AM-PAC PT "6 Clicks" Mobility   Outcome Measure  Help needed turning from your back to your side while in a flat bed without using bedrails?: Total Help needed moving from lying on your back to sitting  on the side of a flat bed without using bedrails?: Total Help needed moving to and from a bed to a chair (including a wheelchair)?: Total Help needed standing up from a chair using your arms (e.g., wheelchair or bedside chair)?: Total Help needed to walk in hospital room?: Total Help needed climbing 3-5 steps with a railing? : Total 6 Click Score: 6    End of Session   Activity Tolerance: Patient tolerated treatment well Patient left: with call bell/phone within reach;in bed;with bed alarm set Nurse Communication: Mobility status;Need for lift equipment PT Visit Diagnosis: Muscle weakness (generalized) (M62.81);Other symptoms and signs involving the nervous system (R29.898);Other abnormalities of gait and mobility (R26.89)     Time: [id number] PT Time Calculation (min) (ACUTE ONLY): 23 min  Charges:  $Therapeutic Activity: 8-22 mins $Neuromuscular Re-education: 8-22 mins                      10/16/2019  Donnella Sham, PT Acute Rehabilitation Services [phone]  (pager) [phone]  (office)   Tessie Fass Leora Platt 10/16/2019, 5:50 PM

## 2019-10-16 NOTE — Consult Note (Addendum)
Physical Medicine and Rehabilitation Consult   Reason for Consult:Functional deficits due to paraplegia/epiduaral abscess.  Referring Physician: Dr. Lynetta Mare   HPI: Ryan Reed is a 68 y.o. male with history of alcohol abuse, normocytic anemia, possible undiagnosed OSA with reports of severe back and chest pain who was seen at urgent care 10/13/19 recommendations to go to ED. 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 He was taken to OR emergently for C7-T7 posterior decompressive laminectomy with medial facetectomy and foraminotomy for evacuation of epidural abscess by Dr. Ronnald Ramp. Post op with ABLA requiring 4 units PRBC as well a A fib treated with BB.  Wound culture with abundant Gemella Morbillorum and currently on Vancomycin/defepime.  2D echo with EF 60-65% with increased LVH.   Dr. Baxter Flattery consulted for input and recommended likely 8 weeks of IV antibiotic therapy pending finalization of cultures. PT evaluation done and patient with limitations due to paraplegia. CIR recommended due to functional decline.     ROS Negative except as indicated in H&P.   History reviewed. No pertinent past medical history.    Past Surgical History:  Procedure Laterality Date   THORACIC LAMINECTOMY FOR EPIDURAL ABSCESS N/A 10/14/2019   Procedure: Cervical seven - THORACIC seven LAMINECTOMIES FOR EPIDURAL ABSCESS;  Surgeon: [name], MD;  Location: [place];  Service: Neurosurgery;  Laterality: N/A;    History reviewed. No pertinent family history.    Social History:  has no history on file for tobacco, alcohol, and drug.    Allergies: No Known Allergies    Medications Prior to Admission  Medication Sig Dispense Refill   HYDROcodone-acetaminophen  (NORCO/VICODIN) 5-325 MG tablet Take 1 tablet by mouth every 6 (six) hours as needed for moderate pain.     tiZANidine (ZANAFLEX) 2 MG tablet Take 1 mg by mouth every 6 (six) hours as needed for muscle spasms.      Home: Home Living Family/patient expects to be discharged to:: Private residence Living Arrangements: Spouse/significant other Available Help at Discharge: Family Type of Home: House Home Access: Stairs to enter Technical brewer of Steps: 2 Entrance Stairs-Rails: None Home Layout: One level Bathroom Shower/Tub: Tub/shower unit, Walk-in shower, Curtain(very narrow door into the bathroom) Biochemist, clinical: Standard Home Equipment: None Additional Comments: works Biomedical scientist, 100 pound dog in the house, 8 chickens, big garden  Functional History: Prior Function Level of Independence: Independent Functional Status:  Mobility: Bed Mobility Overal bed mobility: Needs Assistance Bed Mobility: Rolling, Supine to Sit Rolling: Max assist Supine to sit: +2 for physical assistance, Max assist, +2 for safety/equipment General bed mobility comments: pt requires total (A) at EOB. pt using bil Ue to attempt to prop into static sitting.  Transfers General transfer comment: squat pivot with pad total +2 to chair . pt could benefit from sliding board or stedy to block bil LE Ambulation/Gait General Gait Details: unable    ADL: ADL Overall ADL's : Needs assistance/impaired Eating/Feeding: Minimal assistance, Sitting Grooming: Oral care, Wash/dry face, Sitting, Minimal assistance Upper Body Bathing: Moderate assistance, Sitting Lower Body Bathing: Total assistance Toilet Transfer: Squat-pivot, Maximal assistance, +2 for physical assistance, +2 for safety/equipment Toilet Transfer Details (indicate cue type and reason): pt unable to sustain bil LE knee extension so pivot to chair with pad to  simulate General ADL Comments: pt prop sitting eob this session with decrease trunk  control noted. pt will need to contine to work on EOB static sitting to progress to static standing. pt will require bil LE blocking so stedy might be a good resource for attempting   Cognition: Cognition Overall Cognitive Status: Within Functional Limits for tasks assessed Orientation Level: Oriented X4 Cognition Arousal/Alertness: Awake/alert Behavior During Therapy: WFL for tasks assessed/performed Overall Cognitive Status: Within Functional Limits for tasks assessed  Blood pressure 129/62, pulse 87, temperature 99.1 F (37.3 C), temperature source Oral, resp. rate 12, height 6\' 2"  (1.88 m), weight 90.7 kg, SpO2 97 %. Physical Exam Gen: no distress, normal appearing HEENT: oral mucosa pink and moist, NCAT Cardio: Reg rate Chest: normal effort, normal rate of breathing Abd: soft, non-distended Ext: no edema Skin: intact Neuro: AOx3. Musculoskeletal: 0/5 strength in b/l lower extremities except for trace toe movement on the left. 5/5 in upper extremities but with severely impaired coordination. Sensation limited throughout bilateral lower extremities.  Psych: pleasant, normal affect  Results for orders placed or performed during the hospital encounter of 10/14/19 (from the past 24 hour(s))  CBC     Status: Abnormal   Collection Time: 10/15/19 11:48 AM  Result Value Ref Range   WBC 18.4 (H) 4.0 - 10.5 K/uL   RBC 2.95 (L) 4.22 - 5.81 MIL/uL   Hemoglobin 9.0 (L) 13.0 - 17.0 g/dL   HCT 25.8 (L) 39.0 - 52.0 %   MCV 87.5 80.0 - 100.0 fL   MCH 30.5 26.0 - 34.0 pg   MCHC 34.9 30.0 - 36.0 g/dL   RDW 13.3 11.5 - 15.5 %   Platelets 390 150 - 400 K/uL   nRBC 0.0 0.0 - 0.2 %   Dg Thoracic Spine W/swimmers  Result Date: 10/14/2019 CLINICAL DATA:  Thoracic laminectomy for epidural abscess EXAM: THORACIC SPINE - 3 VIEWS; DG C-ARM 1-60 MIN COMPARISON:  MRI from earlier in the same day. FLUOROSCOPY TIME:  Fluoroscopy Time:  6 seconds Radiation Exposure Index (if provided by the fluoroscopic  device): Not available Number of Acquired Spot Images: 1 FINDINGS: Lateral radiograph of the cervicothoracic junction was performed for localization for thoracic laminectomy. Surgical retractors and instruments are noted posterior to the C7 and T1 vertebral bodies. IMPRESSION: Intraoperative localization at the cervicothoracic junction. Electronically Signed   By: Inez Catalina M.D.   On: 10/14/2019 23:47   Mr Cervical Spine W Wo Contrast  Result Date: 10/14/2019 CLINICAL DATA:  Back pain. Bilateral lower extremity weakness. Febrile. EXAM: MRI CERVICAL SPINE WITHOUT AND WITH CONTRAST TECHNIQUE: Multiplanar and multiecho pulse sequences of the cervical spine, to include the craniocervical junction and cervicothoracic junction, were obtained without and with intravenous contrast. CONTRAST:  46mL GADAVIST GADOBUTROL 1 MMOL/ML IV SOLN COMPARISON:  None. FINDINGS: Alignment: Normal alignment. Image quality degraded by motion. Vertebrae: Negative for fracture or mass. No evidence of discitis. There is edema in the facet and surrounding soft tissues on the left at C6-7. Posterior rim enhancing fluid collection posterior to the facet measuring approximately 12 mm likely a soft tissue abscess. Cord: Cord evaluation limited by motion. Ventral epidural rim enhancing fluid collection from C7 into the thoracic spine compatible with epidural abscess. This is a large fluid collection with compression of the thoracic cord. Posterior Fossa, vertebral arteries, paraspinal tissues: Posterior paraspinous rim enhancing fluid collection behind the left C7-T1 facet compatible with septic arthritis an abscess related to the facet joint. There is posterior lateral epidural  thickening on the left at this level with a 4 x 10 mm abscess in the lateral epidural space. Disc levels: C2-3: Bilateral facet degeneration causing foraminal stenosis bilaterally. C3-4: Bilateral facet degeneration causing foraminal encroachment bilaterally. Small  central disc protrusion. C4-5: Disc degeneration and spondylosis. Left facet degeneration. Bilateral foraminal stenosis and mild spinal stenosis C5-6: Disc degeneration and spondylosis. Mild spinal stenosis and bilateral facets foraminal stenosis due to spurring C6-7: Disc degeneration and spondylosis. Mild spinal stenosis and moderate foraminal stenosis bilaterally C7-T1: Left facet joint effusion with enhancement of the surrounding soft tissues and a soft tissue abscess posterior to the left facet joint. Lateral epidural thickening with 4 x 10 mm lateral epidural abscess. IMPRESSION: 1. Very large ventral epidural abscess from C7 into the thoracic spine causing cord compression. 2. Left facet joint septic arthritis at C7-T1 with surrounding soft tissue abscess. 3. Left lateral epidural abscess at C7-T1. 4. 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 5. These results were called by telephone at the time of interpretation on 10/14/2019 at 5:38 pm to ED provider , who verbally acknowledged these results. Electronically Signed   By: Franchot Gallo M.D.   On: 10/14/2019 17:39   Mr Thoracic Spine W Wo Contrast  Result Date: 10/14/2019 CLINICAL DATA:  Back pain.  Bilateral leg weakness.  Febrile. EXAM: MRI THORACIC WITHOUT AND WITH CONTRAST TECHNIQUE: Multiplanar and multiecho pulse sequences of the thoracic spine were obtained without and with intravenous contrast. CONTRAST:  4mL GADAVIST GADOBUTROL 1 MMOL/ML IV SOLN COMPARISON:  None. FINDINGS: MRI THORACIC SPINE FINDINGS Alignment:  Normal alignment. Vertebrae: Negative for fracture or mass.  No evidence of discitis. Cord: Marked cord compression due to a large ventral epidural fluid collection extending from C7-T7. The fluid collection has peripheral enhancement and is most compatible with epidural abscess which appears be arising from the left C7-T1 facet joint which appears infected. The fluid collection occupies approximately 2/3 of the  spinal canal diameter with posterior compression of the cord. Paraspinal and other soft tissues: No pleural effusion. C7-T1 facet joint on the left shows abnormal enhancement and a fluid collection extending into the posterior soft tissues compatible with abscess. Remaining thoracic spine soft tissues normal. Disc levels: Disc space narrowing and disc degeneration throughout the upper and midthoracic spine. No focal disc protrusion. IMPRESSION: Very large ventral epidural abscess extending from C7-T1. There is marked cord compression. The abscess occupies approximately 2/3 of the anterior spinal canal. The abscess appears be arising from septic arthritis involving the left C7-T1 facet joint. No evidence of discitis. These results were called by telephone at the time of interpretation on 10/14/2019 at 5:52 pm to ED provider , who verbally acknowledged these results. Electronically Signed   By: Franchot Gallo M.D.   On: 10/14/2019 17:54   Mr Lumbar Spine W Wo Contrast (assess For Abscess, Cord Compression)  Result Date: 10/14/2019 CLINICAL DATA:  Back pain.  Bilateral leg weakness.  Febrile. EXAM: MRI LUMBAR SPINE WITHOUT AND WITH CONTRAST TECHNIQUE: Multiplanar and multiecho pulse sequences of the lumbar spine were obtained without and with intravenous contrast. CONTRAST:  68mL GADAVIST GADOBUTROL 1 MMOL/ML IV SOLN COMPARISON:  None. FINDINGS: Segmentation:  Normal Alignment:  Mild retrolisthesis L2-3 and L3-4 Vertebrae: Mild compression fracture superior endplate of L4 with mild edema and bone marrow enhancement extending to the right. This may be a recent fracture. No other fracture or mass lesion. Conus medullaris and cauda equina: Conus extends to the L1 level. Conus and cauda equina appear  normal. Paraspinal and other soft tissues: Negative for paraspinous mass or adenopathy. No soft tissue abscess or fluid collection. Disc levels: L1-2: Mild degenerative change.  Negative for stenosis L2-3: Disc bulging and  moderate facet degeneration causing mild spinal stenosis L3-4: Disc degeneration with disc bulging and endplate spurring. Left foraminal and extraforaminal disc protrusion causing impingement of the left L4 nerve root in the subarticular zone. Moderate facet degeneration and moderate spinal stenosis. L4-5: Asymmetric disc degeneration and spurring on the left. Moderate to severe subarticular and foraminal stenosis on the left due to spurring. Mild spinal stenosis L5-S1: Small central annular tear without significant disc protrusion or stenosis. IMPRESSION: 1. Negative for infection in the lumbar spine. 2. 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 Mild fracture superior endplate of L4 with bone marrow enhancement on the right suggestive of a recent fracture. Electronically Signed   By: Franchot Gallo M.D.   On: 10/14/2019 17:46   Dg Chest Port 1 View  Result Date: 10/14/2019 CLINICAL DATA:  Weakness EXAM: PORTABLE CHEST 1 VIEW COMPARISON:  None. FINDINGS: The heart size and mediastinal contours are within normal limits. Both lungs are clear. The visualized skeletal structures are unremarkable. IMPRESSION: No active disease. Electronically Signed   By: Davina Poke M.D.   On: 10/14/2019 11:19   Dg C-arm 1-60 Min  Result Date: 10/14/2019 CLINICAL DATA:  Thoracic laminectomy for epidural abscess EXAM: THORACIC SPINE - 3 VIEWS; DG C-ARM 1-60 MIN COMPARISON:  MRI from earlier in the same day. FLUOROSCOPY TIME:  Fluoroscopy Time:  6 seconds Radiation Exposure Index (if provided by the fluoroscopic device): Not available Number of Acquired Spot Images: 1 FINDINGS: Lateral radiograph of the cervicothoracic junction was performed for localization for thoracic laminectomy. Surgical retractors and instruments are noted posterior to the C7 and T1 vertebral bodies. IMPRESSION: Intraoperative localization at the cervicothoracic  junction. Electronically Signed   By: Inez Catalina M.D.   On: 10/14/2019 23:47   [place] Ekg Site Rite  Result Date: 10/16/2019 If Site Rite image not attached, placement could not be confirmed due to current cardiac rhythm.    Assessment/Plan: Diagnosis: Impaired mobility and ADLs secondary to 7-T1 epidural abscess.  1. Does the need for close, 24 hr/day medical supervision in concert with the patient's rehab needs make it unreasonable for this patient to be served in a less intensive setting? Yes 2. Co-Morbidities requiring supervision/potential complications: alcohol abuse, normocytic anemia 3. Due to bladder management, bowel management, safety, skin/wound care, disease management, medication administration, pain management and patient education, does the patient require 24 hr/day rehab nursing? Yes 4. Does the patient require coordinated care of a physician, rehab nurse, therapy disciplines of PT, OT to address physical and functional deficits in the context of the above medical diagnosis(es)? Yes Addressing deficits in the following areas: balance, endurance, locomotion, strength, transferring, bowel/bladder control, bathing, dressing, feeding, grooming, toileting, cognition and psychosocial support 5. Can the patient actively participate in an intensive therapy program of at least 3 hrs of therapy per day at least 5 days per week? Yes 6. The potential for patient to make measurable gains while on inpatient rehab is good 7. Anticipated functional outcomes upon discharge from inpatient rehab are min assist  with PT, min assist with OT, independent with SLP. 8. Estimated rehab length of stay to reach the above functional goals is: 10-14 days 9. Anticipated discharge destination: Home 10. Overall Rehab/Functional Prognosis:  good  RECOMMENDATIONS: This patient's condition is appropriate for continued rehabilitative care in the following setting: CIR Patient has agreed to participate in  recommended program. Yes Note that insurance prior authorization may be required for reimbursement for recommended care.  Comment: Ryan Reed would be an excellent inpatient rehabilitation candidate and would be able to tolerate 3 hours of therapy. He would benefit from Melatonin added at night to help him fall asleep.   Bary Leriche, PA-C 10/16/2019   I have personally performed a face to face diagnostic evaluation, including, but not limited to relevant history and physical exam findings, of this patient and developed relevant assessment and plan.  Additionally, I have reviewed and concur with the physician assistant's documentation above.  Leeroy Cha, MD

## 2019-10-16 NOTE — Consult Note (Addendum)
NAME:  Ryan Reed, MRN:  [id number], DOB:  1951/04/07, LOS: 2 ADMISSION DATE:  10/14/2019, CONSULTATION DATE:  10/14/2019 REFERRING MD:  Dr. Ronnald Ramp, CHIEF COMPLAINT:  Epidural abscess   Brief History   68 year old male with no significant past medical history presenting with progressive back back with rapid progression of numbness and paraplegia found to have epidural abscess taken for emergent laminectomy.  EBL 1200 s/p 4 units PRBC.  Extubated in PACU.  Returns to ICU, PCCM consulted for further medical management.   History of present illness   68 year old male with no significant past medical history presenting with progressive back pain with rapid progression of numbness and paraplegia.  Patient is married and works in Biomedical scientist.  Reports he had a "flu like" illness about two weeks ago and as that resolved, he started having back pain which has progressed in severity.  Was seen at a urgent care and treated with muscle relaxants and pain pills.  Non smoker, denies any drug abuse, no history of spinal injections, denies any recent dental work.  He does drink "a few" beers daily but never had problems stopping or withdrawals.  Started having right leg numbness Monday morning with rapid progression of numbness and paraplegia.  MRI showed a large ventral epidural abscess from C7- T1 down to T7 with cord compression.  Labs noted for CRP 12.5 and ESR 110.  Underwent emergent laminectomy by Neurosurgery on 11/23. EBL 1200 s/p 4 units PRBC.  Patient extubated post-operatively and PCCM consulted for further medical management while in ICU.   Past Medical History  No significant history.  Daily beer use  Significant Hospital Events   11/24 Admitted/ OR  Consults:  PCCM  Procedures:  11/24 Emergent C7-T7 posterior decompressive laminectomy, medial facetectomy and foraminotomy, with evacuation of ventral epidural abscess  Significant Diagnostic Tests:  11/24 MRI cervical/ lumbar/ thoracic  >> Very large ventral epidural abscess extending from C7-T1. There is marked cord compression. The abscess occupies approximately 2/3 of the anterior spinal canal. The abscess appears be arising from septic arthritis involving the left C7-T1 facet joint.   No evidence of discitis. 11/24 TTE >> normal study with no evidence of endocarditis.  Micro Data:  11/23 BCx 2 >> negative at 48h 11/23 SARS CoV2 >> neg 11/23 epidural abscess >> mod GPCs >> 11/24 MRSA PCR >> negative  Antimicrobials:  11/24 vanc >> 11/24 cefepime >>  Interim history/subjective:  Complains of tremor and feeling cold. Denies pain.   Objective   Blood pressure 125/70, pulse 83, temperature 99.2 F (37.3 C), temperature source Oral, resp. rate 20, height '6\' 2"'  (1.88 m), weight 90.7 kg, SpO2 97 %.        Intake/Output Summary (Last 24 hours) at 10/16/2019 0726 Last data filed at 10/16/2019 0600 Gross per 24 hour  Intake 2462.66 ml  Output 1500 ml  Net 962.66 ml   Filed Weights   10/14/19 0826  Weight: 90.7 kg    Examination: General:  Older male lying in bed in NAD HEENT: MM pink/dry, very poor dentition, pupils 4/reactive Neuro: Alert/ oriented x 3, 5/5 bilateral upper extremity grips, flaccid in lower LE, ongoing numbness from nipple line down. Mild resting tremor has improved.  CV: rr, no murmur PULM:  Non labored, CTA GI: soft, bs active, foley  Extremities: warm/dry, no LE edema, no stigmata of SBE  Skin: no rashes  Resolved Hospital Problem list   None  Assessment & Plan:   Ventral  epidural abscess with cord compression and paraplegia - location suggests hematogenous spread. Status post C7-T7 decompressive laminectomy. Normocytic anemia of chronic disease Poor dentition Alcohol abuse Possible undiagnosed OSA. Atrial fibrillation -transient at has resolved with metoprolol  Usual postoperative care including incentive spirometry. Some return of movement - CIR consultation in. No  endocarditis. ID recommends 8 weeks of parenteral antibiotics. Ready for transfer out of ICU.  PCCM to sign off. Only outstanding medical issue at this time is being addressed by ID.  Daily Goals Checklist  Pain/Anxiety/Delirium protocol (if indicated): prn oxycodone, robaxin for spasms. CIWA. Respiratory support goals: incentive spirometry Blood pressure target: SBP 120-160, Start metoprolol for HR control. DVT prophylaxis: SCD, start UFH tomorrow Nutritional status and feeding goals: Regular diet. GI prophylaxis: none required. Fluid status goals: N/S lock IV Urinary catheter: Per neurosurgery Central lines: PIV Glucose control: euglycemic on no therapy. Mobility/therapy needs: PT/OT. CIR consult place. Antibiotic de-escalation: Continue current antibiotics pending cultures and ID consultation. Home medication reconciliation: none relevant. Daily labs: twice weekly labs Code Status: Full code Family Communication: patient informed. Disposition: transfer to floor.  Discussed with Dr Ronnald Ramp and orders reconciled.   Labs   CBC: Recent Labs  Lab 10/14/19 0922 10/14/19 2339 10/15/19 0346 10/15/19 1148  WBC 16.2*  --  14.7* 18.4*  NEUTROABS 13.8*  --   --   --   HGB 7.4* 11.2* 8.9* 9.0*  HCT 21.2* 33.0* 25.7* 25.8*  MCV 90.6  --  88.3 87.5  PLT 464*  --  349 545    Basic Metabolic Panel: Recent Labs  Lab 10/14/19 0922 10/14/19 2339 10/15/19 0346  NA 132* 136 137  K 3.4* 3.6 3.9  CL 96* 103 104  CO2 24  --  23  GLUCOSE 111* 163* 148*  BUN '9 11 11  ' CREATININE 0.70 0.60* 0.56*  CALCIUM 8.5*  --  8.0*  MG  --   --  2.1  PHOS  --   --  3.8   GFR: Estimated Creatinine Clearance: 104.2 mL/min (A) (by C-G formula based on SCr of 0.56 mg/dL (L)). Recent Labs  Lab 10/14/19 0922 10/14/19 1406 10/15/19 0346 10/15/19 1148  WBC 16.2*  --  14.7* 18.4*  LATICACIDVEN  --  0.7  --   --     Liver Function Tests: Recent Labs  Lab 10/14/19 0922 10/15/19 0346  AST 29   --   ALT 39  --   ALKPHOS 57  --   BILITOT 0.7  --   PROT 6.2*  --   ALBUMIN 2.5* 2.1*   No results for input(s): LIPASE, AMYLASE in the last 168 hours. No results for input(s): AMMONIA in the last 168 hours.  ABG    Component Value Date/Time   TCO2 26 10/14/2019 2339     Coagulation Profile: Recent Labs  Lab 10/14/19 1817  INR 1.3*    Cardiac Enzymes: No results for input(s): CKTOTAL, CKMB, CKMBINDEX, TROPONINI in the last 168 hours.  HbA1C: No results found for: HGBA1C  CBG: No results for input(s): GLUCAP in the last 168 hours.  35 min spent with >50% in counseling and coordination of care.  Kipp Brood, MD [facility] ICU Physician [place]  Pager: [phone] Mobile: [phone] After hours: [phone].  10/16/2019, 7:26 AM

## 2019-10-17 MED ORDER — SODIUM CHLORIDE 0.9% FLUSH: 10.0000 mL | INTRAVENOUS | Status: DC | PRN

## 2019-10-17 MED ORDER — SODIUM CHLORIDE 0.9% FLUSH
10.0000 mL | Freq: Two times a day (BID) | INTRAVENOUS | Status: DC
  Administered 2019-10-17 – 2019-10-18 (×2): 10 mL

## 2019-10-17 NOTE — Progress Notes (Signed)
Patient ID: Ryan Reed, male   DOB: [birth year], 68 y.o.   MRN: [id number] Vital signs are stable Patient notes good sensation in the lower extremities Volitional movement is not present Drains have minimum output We will likely discontinue them tomorrow Consider transfer to rehab if bed available

## 2019-10-17 NOTE — Anesthesia Postprocedure Evaluation (Signed)
Anesthesia Post Note  Patient: Ryan Reed  Procedure(s) Performed: Cervical seven - THORACIC seven LAMINECTOMIES FOR EPIDURAL ABSCESS (N/A Back)     Patient location during evaluation: PACU Anesthesia Type: General Level of consciousness: awake and alert Pain management: pain level controlled Vital Signs Assessment: post-procedure vital signs reviewed and stable Respiratory status: spontaneous breathing, nonlabored ventilation, respiratory function stable and patient connected to nasal cannula oxygen Cardiovascular status: blood pressure returned to baseline and stable Postop Assessment: no apparent nausea or vomiting Anesthetic complications: no    Last Vitals:  Vitals:   10/17/19 1857 10/17/19 1950  BP:  (!) 121/58  Pulse:  81  Resp:  17  Temp: (!) 38.7 C 37 C  SpO2:  100%    Last Pain:  Vitals:   10/17/19 1950  TempSrc: Oral  PainSc:                  Catalina Gravel

## 2019-10-17 NOTE — Progress Notes (Signed)
Peripherally Inserted Central Catheter/Midline Placement  The IV Nurse has discussed with the patient and/or persons authorized to consent for the patient, the purpose of this procedure and the potential benefits and risks involved with this procedure.  The benefits include less needle sticks, lab draws from the catheter, and the patient may be discharged home with the catheter. Risks include, but not limited to, infection, bleeding, blood clot (thrombus formation), and puncture of an artery; nerve damage and irregular heartbeat and possibility to perform a PICC exchange if needed/ordered by physician.  Alternatives to this procedure were also discussed.  Bard Power PICC patient education guide, fact sheet on infection prevention and patient information card has been provided to patient /or left at bedside.    PICC/Midline Placement Documentation  PICC Single Lumen [id number] PICC Right Basilic 43 cm 0 cm (Active)  Indication for Insertion or Continuance of Line Home intravenous therapies (PICC only) 10/17/19 1615  Exposed Catheter (cm) 0 cm 10/17/19 1615  Site Assessment Clean;Dry;Intact 10/17/19 1615  Line Status Flushed;Saline locked;Blood return noted 10/17/19 1615  Dressing Type Transparent 10/17/19 1615  Dressing Status Clean;Dry;Intact 10/17/19 1615  Dressing Intervention New dressing 10/17/19 1615  Dressing Change Due 10/24/19 10/17/19 1615       Deanta Mincey, Nicolette Bang 10/17/2019, 4:17 PM

## 2019-10-18 ENCOUNTER — Inpatient Hospital Stay (HOSPITAL_COMMUNITY): Payer: Medicare Other

## 2019-10-18 ENCOUNTER — Encounter (HOSPITAL_COMMUNITY): Payer: Self-pay | Admitting: Physical Medicine and Rehabilitation

## 2019-10-18 ENCOUNTER — Inpatient Hospital Stay (HOSPITAL_COMMUNITY)
Admission: RE | Admit: 2019-10-18 | Discharge: 2019-11-13 | DRG: 052 | Disposition: A | Discharge status: Home Health | Payer: Medicare Other | Source: Intra-hospital | Attending: Physical Medicine and Rehabilitation | Admitting: Physical Medicine and Rehabilitation

## 2019-10-18 ENCOUNTER — Encounter (HOSPITAL_COMMUNITY): Payer: Self-pay | Admitting: *Deleted

## 2019-10-18 DIAGNOSIS — R109 Unspecified abdominal pain: Secondary | ICD-10-CM | POA: Diagnosis not present

## 2019-10-18 DIAGNOSIS — Z23 Encounter for immunization: Secondary | ICD-10-CM | POA: Diagnosis not present

## 2019-10-18 DIAGNOSIS — G061 Intraspinal abscess and granuloma: Secondary | ICD-10-CM | POA: Diagnosis not present

## 2019-10-18 DIAGNOSIS — L8990 Pressure ulcer of unspecified site, unspecified stage: Secondary | ICD-10-CM | POA: Insufficient documentation

## 2019-10-18 DIAGNOSIS — D62 Acute posthemorrhagic anemia: Secondary | ICD-10-CM | POA: Diagnosis present

## 2019-10-18 DIAGNOSIS — G904 Autonomic dysreflexia: Secondary | ICD-10-CM | POA: Diagnosis not present

## 2019-10-18 DIAGNOSIS — K592 Neurogenic bowel, not elsewhere classified: Secondary | ICD-10-CM | POA: Diagnosis not present

## 2019-10-18 DIAGNOSIS — G062 Extradural and subdural abscess, unspecified: Secondary | ICD-10-CM | POA: Diagnosis present

## 2019-10-18 DIAGNOSIS — N319 Neuromuscular dysfunction of bladder, unspecified: Secondary | ICD-10-CM | POA: Diagnosis not present

## 2019-10-18 DIAGNOSIS — R739 Hyperglycemia, unspecified: Secondary | ICD-10-CM | POA: Diagnosis present

## 2019-10-18 DIAGNOSIS — R252 Cramp and spasm: Secondary | ICD-10-CM | POA: Diagnosis not present

## 2019-10-18 DIAGNOSIS — D649 Anemia, unspecified: Secondary | ICD-10-CM

## 2019-10-18 DIAGNOSIS — I951 Orthostatic hypotension: Secondary | ICD-10-CM | POA: Diagnosis not present

## 2019-10-18 DIAGNOSIS — I480 Paroxysmal atrial fibrillation: Secondary | ICD-10-CM | POA: Diagnosis not present

## 2019-10-18 DIAGNOSIS — K5900 Constipation, unspecified: Secondary | ICD-10-CM

## 2019-10-18 DIAGNOSIS — L89150 Pressure ulcer of sacral region, unstageable: Secondary | ICD-10-CM | POA: Diagnosis not present

## 2019-10-18 DIAGNOSIS — A491 Streptococcal infection, unspecified site: Secondary | ICD-10-CM | POA: Diagnosis present

## 2019-10-18 DIAGNOSIS — Z20828 Contact with and (suspected) exposure to other viral communicable diseases: Secondary | ICD-10-CM | POA: Diagnosis present

## 2019-10-18 DIAGNOSIS — I82463 Acute embolism and thrombosis of calf muscular vein, bilateral: Secondary | ICD-10-CM | POA: Diagnosis not present

## 2019-10-18 DIAGNOSIS — L89312 Pressure ulcer of right buttock, stage 2: Secondary | ICD-10-CM | POA: Diagnosis not present

## 2019-10-18 DIAGNOSIS — B9689 Other specified bacterial agents as the cause of diseases classified elsewhere: Secondary | ICD-10-CM | POA: Diagnosis not present

## 2019-10-18 DIAGNOSIS — M4653 Other infective spondylopathies, cervicothoracic region: Secondary | ICD-10-CM | POA: Diagnosis present

## 2019-10-18 DIAGNOSIS — R339 Retention of urine, unspecified: Secondary | ICD-10-CM | POA: Diagnosis not present

## 2019-10-18 DIAGNOSIS — G8250 Quadriplegia, unspecified: Principal | ICD-10-CM | POA: Diagnosis present

## 2019-10-18 DIAGNOSIS — K529 Noninfective gastroenteritis and colitis, unspecified: Secondary | ICD-10-CM

## 2019-10-18 DIAGNOSIS — G8220 Paraplegia, unspecified: Secondary | ICD-10-CM | POA: Diagnosis not present

## 2019-10-18 DIAGNOSIS — K5901 Slow transit constipation: Secondary | ICD-10-CM | POA: Diagnosis not present

## 2019-10-18 LAB — BPAM RBC
Blood Product Expiration Date: 202012102359
Blood Product Expiration Date: 202012132359
Blood Product Expiration Date: 202012132359
Blood Product Expiration Date: 202012132359
Blood Product Expiration Date: 202012162359
Blood Product Expiration Date: 202012162359
ISSUE DATE / TIME: 202011182057
ISSUE DATE / TIME: 202011232038
ISSUE DATE / TIME: 202011232038
ISSUE DATE / TIME: 202011232038
ISSUE DATE / TIME: 202011232038
Unit Type and Rh: 6200
Unit Type and Rh: 6200
Unit Type and Rh: 6200
Unit Type and Rh: 6200
Unit Type and Rh: 6200
Unit Type and Rh: 6200

## 2019-10-18 LAB — TYPE AND SCREEN
ABO/RH(D): A POS
Antibody Screen: NEGATIVE
Unit division: 0
Unit division: 0
Unit division: 0
Unit division: 0
Unit division: 0
Unit division: 0

## 2019-10-18 MED ORDER — PHENOL 1.4 % MT LIQD: 1.0000 | OROMUCOSAL | Status: DC | PRN

## 2019-10-18 MED ORDER — CYCLOBENZAPRINE HCL 5 MG PO TABS: 5.0000 mg | ORAL_TABLET | Freq: Three times a day (TID) | ORAL | Status: DC | PRN

## 2019-10-18 MED ORDER — PRO-STAT SUGAR FREE PO LIQD
30.0000 mL | Freq: Two times a day (BID) | ORAL | Status: DC
  Administered 2019-10-18 – 2019-11-13 (×48): 30 mL via ORAL
  Filled 2019-10-18 (×52): qty 30

## 2019-10-18 MED ORDER — SENNA 8.6 MG PO TABS
2.0000 | ORAL_TABLET | Freq: Every day | ORAL | Status: DC
  Administered 2019-10-19 – 2019-10-24 (×5): 17.2 mg via ORAL
  Filled 2019-10-18 (×6): qty 2

## 2019-10-18 MED ORDER — POLYETHYLENE GLYCOL 3350 17 G PO PACK
17.0000 g | PACK | Freq: Every day | ORAL | Status: DC | PRN
  Administered 2019-10-24: 17 g via ORAL
  Filled 2019-10-18: qty 1

## 2019-10-18 MED ORDER — SODIUM CHLORIDE 0.9% FLUSH
10.0000 mL | Freq: Two times a day (BID) | INTRAVENOUS | Status: DC
  Administered 2019-10-21 – 2019-10-27 (×6): 10 mL
  Administered 2019-10-28: 15 mL
  Administered 2019-10-30 – 2019-11-03 (×6): 10 mL
  Administered 2019-11-04: 15 mL
  Administered 2019-11-05: 20 mL
  Administered 2019-11-06 – 2019-11-13 (×5): 10 mL

## 2019-10-18 MED ORDER — MENTHOL 3 MG MT LOZG
1.0000 | LOZENGE | OROMUCOSAL | Status: DC | PRN
  Filled 2019-10-18: qty 9

## 2019-10-18 MED ORDER — PROCHLORPERAZINE MALEATE 5 MG PO TABS: 5.0000 mg | ORAL_TABLET | Freq: Four times a day (QID) | ORAL | Status: DC | PRN

## 2019-10-18 MED ORDER — METHOCARBAMOL 1000 MG/10ML IJ SOLN
500.0000 mg | Freq: Four times a day (QID) | INTRAVENOUS | Status: DC | PRN
  Filled 2019-10-18: qty 5

## 2019-10-18 MED ORDER — GUAIFENESIN-DM 100-10 MG/5ML PO SYRP
5.0000 mL | ORAL_SOLUTION | Freq: Four times a day (QID) | ORAL | Status: DC | PRN
  Administered 2019-10-28: 10 mL via ORAL
  Filled 2019-10-18: qty 10

## 2019-10-18 MED ORDER — PROCHLORPERAZINE 25 MG RE SUPP: 12.5000 mg | Freq: Four times a day (QID) | RECTAL | Status: DC | PRN

## 2019-10-18 MED ORDER — PNEUMOCOCCAL VAC POLYVALENT 25 MCG/0.5ML IJ INJ
0.5000 mL | INJECTION | INTRAMUSCULAR | Status: AC
  Administered 2019-10-19: 0.5 mL via INTRAMUSCULAR
  Filled 2019-10-18: qty 0.5

## 2019-10-18 MED ORDER — ADULT MULTIVITAMIN W/MINERALS CH
1.0000 | ORAL_TABLET | Freq: Every day | ORAL | Status: DC
  Administered 2019-10-18 – 2019-11-13 (×27): 1 via ORAL
  Filled 2019-10-18 (×27): qty 1

## 2019-10-18 MED ORDER — METOPROLOL TARTRATE 25 MG PO TABS
25.0000 mg | ORAL_TABLET | Freq: Two times a day (BID) | ORAL | Status: DC
  Administered 2019-10-18 – 2019-11-05 (×37): 25 mg via ORAL
  Filled 2019-10-18 (×37): qty 1

## 2019-10-18 MED ORDER — TRAMADOL HCL 50 MG PO TABS
50.0000 mg | ORAL_TABLET | Freq: Four times a day (QID) | ORAL | Status: DC | PRN
  Administered 2019-10-22: 50 mg via ORAL
  Administered 2019-10-23 – 2019-10-24 (×2): 100 mg via ORAL
  Administered 2019-10-28 – 2019-11-02 (×3): 50 mg via ORAL
  Administered 2019-11-03 – 2019-11-06 (×4): 100 mg via ORAL
  Administered 2019-11-07: 50 mg via ORAL
  Administered 2019-11-08: 100 mg via ORAL
  Administered 2019-11-10 – 2019-11-11 (×2): 50 mg via ORAL
  Filled 2019-10-18 (×2): qty 2
  Filled 2019-10-18: qty 1
  Filled 2019-10-18 (×3): qty 2
  Filled 2019-10-18: qty 1
  Filled 2019-10-18 (×2): qty 2
  Filled 2019-10-18 (×2): qty 1
  Filled 2019-10-18 (×3): qty 2
  Filled 2019-10-18: qty 1

## 2019-10-18 MED ORDER — ONDANSETRON HCL 4 MG PO TABS: 4.0000 mg | ORAL_TABLET | Freq: Four times a day (QID) | ORAL | Status: DC | PRN

## 2019-10-18 MED ORDER — CHLORHEXIDINE GLUCONATE CLOTH 2 % EX PADS
6.0000 | MEDICATED_PAD | Freq: Every day | CUTANEOUS | Status: DC
  Administered 2019-10-19 – 2019-11-03 (×15): 6 via TOPICAL

## 2019-10-18 MED ORDER — DIPHENHYDRAMINE HCL 12.5 MG/5ML PO ELIX: 12.5000 mg | ORAL_SOLUTION | Freq: Four times a day (QID) | ORAL | Status: DC | PRN

## 2019-10-18 MED ORDER — SODIUM CHLORIDE 0.9 % IV SOLN
2.0000 g | Freq: Two times a day (BID) | INTRAVENOUS | Status: DC
  Administered 2019-10-18 – 2019-11-13 (×52): 2 g via INTRAVENOUS
  Filled 2019-10-18 (×4): qty 2
  Filled 2019-10-18: qty 20
  Filled 2019-10-18 (×2): qty 2
  Filled 2019-10-18: qty 20
  Filled 2019-10-18: qty 2
  Filled 2019-10-18: qty 20
  Filled 2019-10-18 (×10): qty 2
  Filled 2019-10-18: qty 20
  Filled 2019-10-18 (×2): qty 2
  Filled 2019-10-18: qty 20
  Filled 2019-10-18 (×2): qty 2
  Filled 2019-10-18: qty 20
  Filled 2019-10-18: qty 2
  Filled 2019-10-18: qty 20
  Filled 2019-10-18: qty 2
  Filled 2019-10-18: qty 20
  Filled 2019-10-18: qty 2
  Filled 2019-10-18: qty 20
  Filled 2019-10-18 (×7): qty 2
  Filled 2019-10-18: qty 20
  Filled 2019-10-18 (×3): qty 2
  Filled 2019-10-18 (×3): qty 20
  Filled 2019-10-18 (×2): qty 2
  Filled 2019-10-18: qty 20
  Filled 2019-10-18 (×3): qty 2
  Filled 2019-10-18: qty 20
  Filled 2019-10-18 (×4): qty 2

## 2019-10-18 MED ORDER — FLEET ENEMA 7-19 GM/118ML RE ENEM
1.0000 | ENEMA | Freq: Once | RECTAL | Status: AC | PRN
  Administered 2019-10-18: 1 via RECTAL
  Filled 2019-10-18: qty 1

## 2019-10-18 MED ORDER — ALUM & MAG HYDROXIDE-SIMETH 200-200-20 MG/5ML PO SUSP: 30.0000 mL | ORAL | Status: DC | PRN

## 2019-10-18 MED ORDER — BISACODYL 10 MG RE SUPP
10.0000 mg | Freq: Every day | RECTAL | Status: DC
  Administered 2019-10-18 – 2019-11-12 (×23): 10 mg via RECTAL
  Filled 2019-10-18 (×25): qty 1

## 2019-10-18 MED ORDER — SODIUM CHLORIDE 0.9% FLUSH
10.0000 mL | INTRAVENOUS | Status: DC | PRN
  Administered 2019-10-20 – 2019-11-08 (×8): 10 mL
  Filled 2019-10-18 (×5): qty 40

## 2019-10-18 MED ORDER — TRAZODONE HCL 50 MG PO TABS
25.0000 mg | ORAL_TABLET | Freq: Every evening | ORAL | Status: DC | PRN
  Administered 2019-10-23 – 2019-11-12 (×6): 50 mg via ORAL
  Filled 2019-10-18 (×6): qty 1

## 2019-10-18 MED ORDER — LIDOCAINE HCL URETHRAL/MUCOSAL 2 % EX GEL
CUTANEOUS | Status: DC | PRN
  Administered 2019-10-23 – 2019-10-24 (×2): 5 via TOPICAL
  Filled 2019-10-18: qty 5

## 2019-10-18 MED ORDER — ONDANSETRON HCL 4 MG/2ML IJ SOLN: 4.0000 mg | Freq: Four times a day (QID) | INTRAMUSCULAR | Status: DC | PRN

## 2019-10-18 MED ORDER — METHOCARBAMOL 500 MG PO TABS
500.0000 mg | ORAL_TABLET | Freq: Four times a day (QID) | ORAL | Status: DC | PRN
  Administered 2019-10-19 – 2019-11-12 (×7): 500 mg via ORAL
  Filled 2019-10-18 (×7): qty 1

## 2019-10-18 MED ORDER — PROCHLORPERAZINE EDISYLATE 10 MG/2ML IJ SOLN: 5.0000 mg | Freq: Four times a day (QID) | INTRAMUSCULAR | Status: DC | PRN

## 2019-10-18 MED ORDER — INFLUENZA VAC A&B SA ADJ QUAD 0.5 ML IM PRSY
0.5000 mL | PREFILLED_SYRINGE | INTRAMUSCULAR | Status: DC
  Filled 2019-10-18: qty 0.5

## 2019-10-18 MED ORDER — ACETAMINOPHEN 325 MG PO TABS
325.0000 mg | ORAL_TABLET | ORAL | Status: DC | PRN
  Administered 2019-10-20 – 2019-10-24 (×4): 650 mg via ORAL
  Filled 2019-10-18 (×5): qty 2

## 2019-10-18 NOTE — Progress Notes (Signed)
Rn performed dig stim after dinner and got large amount of liquid stool. Enema was administered after dig stim and resulted in more liquid stool.

## 2019-10-18 NOTE — Progress Notes (Signed)
Inpatient Rehabilitation-Admissions Coordinator   I have received insurance approval and have medical clearance from attending service for admit to CIR today. I have reviewed insurance benefit letter and consent forms signed. Pt and wife willing to proceed with IP Rehab.   RN and [facility] team updated on plan for admit today.   Please call if questions.   Jhonnie Garner, OTR/L  Rehab Admissions Coordinator  [phone] 10/18/2019 2:03 PM

## 2019-10-18 NOTE — H&P (Signed)
Physical Medicine and Rehabilitation Admission H&P    CC:  Functional deficits due to paraplegia/epiduarl abscess.    HPI:  UNNAMED DAZEY is a 68 year old male in relatively good health except for possibly undiagnosed OSA, normocytic anemia who was developed severe back and chest pain--seen at urgent care on 10/13/19 with recommendations to go to ED but declined. He developed acute onset of paraplegia with sensory loss and was admitted on 10/14/19 for work up. 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  He was taken to the OR emergently for C7-T7 posterior decompressive laminectomy with medial facetectomy and foraminotomy for evacuation of epidural abscess by Dr. Ronnald Ramp.  Postop had acute blood loss anemia requiring 4 units of packed red blood cells as well as A. fib requiring beta-blocker for rate control.  Wound culture shows abundant Gemella morbilliform and he was started on Vanco/cefepime initially--this was narrowed to Rocephin 2 g twice daily on 11/25.   2D echo done showed EF of 60 to 65% with increased LVH--no evidence of endocarditis.  Dr. Graylon Good recommended 8 weeks of IV antibiotic therapy and will follow up for further recommendations.  Patient with paraplegia and sensory deficits from chest down.  Foley in place due to neurogenic bladder and he has not had a bowel movement except for small accident this a.m.  Therapy has been ongoing and working on bed mobility.  CIR recommended due to functional decline   Review of Systems  Constitutional: Negative for chills and fever.  HENT: Negative for hearing loss and tinnitus.   Eyes: Negative for blurred vision and double vision.  Respiratory: Negative for cough and shortness of breath.   Cardiovascular: Negative for chest pain and palpitations.  Gastrointestinal: Positive for abdominal pain and  diarrhea. Negative for heartburn and nausea.  Genitourinary: Negative for dysuria and urgency.  Musculoskeletal: Positive for back pain and myalgias.  Skin: Negative for rash.  Neurological: Positive for speech change, focal weakness and weakness. Negative for dizziness and headaches.  Psychiatric/Behavioral: The patient does not have insomnia.       History reviewed. No pertinent past medical history.    Past Surgical History:  Procedure Laterality Date  . LACERATION REPAIR Left    left thigh  . THORACIC LAMINECTOMY FOR EPIDURAL ABSCESS N/A 10/14/2019   Procedure: Cervical seven - THORACIC seven LAMINECTOMIES FOR EPIDURAL ABSCESS;  Surgeon: [name], MD;  Location: [facility];  Service: Neurosurgery;  Laterality: N/A;     History reviewed. No pertinent family history.    Social History:  Married. Moved from [place] 2 years ago. 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 He does not use tobacco, dip snuff or vapes. He drinks a 2 beers daily. He denies use of illicit drugs.     Allergies: No Known Allergies    Medications Prior to Admission  Medication Sig Dispense Refill  . HYDROcodone-acetaminophen (NORCO/VICODIN) 5-325 MG tablet Take 1 tablet by mouth every 6 (six) hours as needed for moderate pain.    Marland Kitchen tiZANidine (ZANAFLEX) 2 MG tablet Take 1 mg by mouth every 6 (six) hours as needed for muscle spasms.      Drug Regimen Review  Drug regimen was reviewed and remains appropriate with no significant issues identified  Home: Home Living Family/patient expects to be discharged  to:: Private residence Living Arrangements: Spouse/significant other Available Help at Discharge: Family Type of Home: House Home Access: Stairs to enter Technical brewer of Steps: 2 Entrance Stairs-Rails: None Home Layout: One level Bathroom Shower/Tub: Tub/shower unit, Walk-in shower, Curtain(very narrow door into the bathroom)  Biochemist, clinical: Standard Home Equipment: None Additional Comments: works Biomedical scientist, 100 pound dog in the house, 8 chickens, big garden   Functional History: Prior Function Level of Independence: Independent  Functional Status:  Mobility: Bed Mobility Overal bed mobility: Needs Assistance Bed Mobility: Rolling, Supine to Sit Rolling: Max assist Supine to sit: +2 for physical assistance, Max assist, +2 for safety/equipment General bed mobility comments: pt requires total (A) at EOB. pt using bil Ue to attempt to prop into static sitting.  Transfers General transfer comment: squat pivot with pad total +2 to chair . pt could benefit from sliding board or stedy to block bil LE Ambulation/Gait General Gait Details: unable    ADL: ADL Overall ADL's : Needs assistance/impaired Eating/Feeding: Minimal assistance, Sitting Grooming: Oral care, Wash/dry face, Sitting, Minimal assistance Upper Body Bathing: Moderate assistance, Sitting Lower Body Bathing: Total assistance Toilet Transfer: Squat-pivot, Maximal assistance, +2 for physical assistance, +2 for safety/equipment Toilet Transfer Details (indicate cue type and reason): pt unable to sustain bil LE knee extension so pivot to chair with pad to simulate General ADL Comments: pt prop sitting eob this session with decrease trunk control noted. pt will need to contine to work on EOB static sitting to progress to static standing. pt will require bil LE blocking so stedy might be a good resource for attempting   Cognition: Cognition Overall Cognitive Status: Within Functional Limits for tasks assessed Orientation Level: Oriented X4 Cognition Arousal/Alertness: Awake/alert Behavior During Therapy: WFL for tasks assessed/performed Overall Cognitive Status: Within Functional Limits for tasks assessed   Blood pressure 128/63, pulse 80, temperature 98.3 F (36.8 C), temperature source Oral, resp. rate 20, height 6\' 2"  (1.88 m), weight  90.7 kg, SpO2 100 %. Physical Exam  Nursing note and vitals reviewed. Constitutional: He is oriented to person, place, and time. He appears well-developed and well-nourished. No distress.  HENT:  Head: Normocephalic and atraumatic.  Right Ear: External ear normal.  Eyes: Pupils are equal, round, and reactive to light. Conjunctivae are normal.  Neck: Normal range of motion.  Respiratory: Effort normal and breath sounds normal. No stridor. No respiratory distress. He has no wheezes.  GI: Soft. He exhibits no distension. There is no abdominal tenderness.     Musculoskeletal:        General: No deformity.     Comments: 1+ pedal edema bilaterally  Neurological: He is alert and oriented to person, place, and time. No cranial nerve deficit. Coordination normal.  Skin: Skin is warm and dry. No rash noted. He is not diaphoretic. No erythema.  Pressure injury to buttocks and scrotum--blistered area on right buttock and on underside of scrotum.       No results found for this or any previous visit (from the past 48 hour(s)). No results found.     Medical Problem List and Plan: 1.  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  Antithrombotics: -DVT/anticoagulation:  Mechanical: Sequential compression devices, below knee Bilateral lower extremities  -antiplatelet therapy: N/A 3. Pain Management: does not like oxycodone due to SE--will change to ultram prn.  Encouraged patient to take muscle relaxers and Tylenol  as needed 4. Mood: LCSW to follow for evaluation and support  -antipsychotic agents: N/A 5. Neuropsych: This patient is capable of making decisions on his own behalf. 6. Skin/Wound Care: Routine pressure relief measures with local care to sacrum. [place] RN consult as appropriate. 7. Fluids/Electrolytes/Nutrition: Monitor I's/O.  Check lytes in a.m. 8.  Acute  blood loss anemia: Recheck CBC in a.m.  9.  PAF: Monitor heart rate 3 times daily.  Continue metoprolol 25 mg twice daily 10.   Neurogenic bladder: We will keep Foley in place for the next 48 to 72 hours until bowel program adjusted 11.  Neurogenic bowel: Reports p.o. intake has been poor due to change in taste---has been drinking a lot of smoothies.  Discussed dietary needs and changes.  Change senna to 2 every morning with suppositories in evening after supper for PM program.  Start today.  Will check KUB to evaluate stool burden as reporting abdominal pain since yesterday. 12. Leucocytosis: Follow up CBC in am.  13.  Hyperglycemia:  Stress  Induced hyperglycemia v/s IPG--will check Hgb A1C in am.       Bary Leriche, PA-C 10/18/2019

## 2019-10-18 NOTE — Progress Notes (Signed)
Physical Therapy Treatment Patient Details Name: Ryan Reed MRN: [id number] DOB: 10-12-1951 Today's Date: 10/18/2019    History of Present Illness 68 yo male progressive back pain and rapid progression of numbness and paraplegia. MRI L ventral pepidural abscess C7-T1 down to T7 with cord compression. 11/23 underwent emergent laminectomy  PMH None    PT Comments    Pt had BM on arrival (first time in 8 days per pt).  Assisted pt with rolling while nurse tech performed hygiene and linen change.  Pt rolled multiple times for pericare and linen placement.  Pt's bed placed in chair position upon departing (nurse tech and RN remained in room).  Pt with flaccid lower body and requires total assist.  Pt eager to regain movement.  Continue to recommend CIR.   Follow Up Recommendations  CIR;Supervision/Assistance - 24 hour     Equipment Recommendations  Wheelchair (measurements PT);Wheelchair cushion (measurements PT)    Recommendations for Other Services       Precautions / Restrictions Precautions Precautions: Cervical;Back Precaution Comments: 2 JP drains from cervical area    Mobility  Bed Mobility Overal bed mobility: Needs Assistance Bed Mobility: Rolling Rolling: +2 for physical assistance;Total assist         General bed mobility comments: pt attempts to assist with holding bed rails however requires total assist for rolling; placed bed in chair position end of session  Transfers                 General transfer comment: deferred (need +2)  Ambulation/Gait                 Stairs             Wheelchair Mobility    Modified Rankin (Stroke Patients Only)       Balance                                            Cognition Arousal/Alertness: Awake/alert Behavior During Therapy: WFL for tasks assessed/performed Overall Cognitive Status: Within Functional Limits for tasks assessed                                         Exercises General Exercises - Lower Extremity Ankle Circles/Pumps: PROM;Both;10 reps Heel Slides: PROM;Both;10 reps    General Comments        Pertinent Vitals/Pain Pain Assessment: No/denies pain    Home Living                      Prior Function            PT Goals (current goals can now be found in the care plan section) Progress towards PT goals: Progressing toward goals    Frequency    Min 4X/week      PT Plan Current plan remains appropriate    Co-evaluation              AM-PAC PT "6 Clicks" Mobility   Outcome Measure  Help needed turning from your back to your side while in a flat bed without using bedrails?: Total Help needed moving from lying on your back to sitting on the side of a flat bed without using bedrails?: Total Help needed moving to and from a bed to  a chair (including a wheelchair)?: Total Help needed standing up from a chair using your arms (e.g., wheelchair or bedside chair)?: Total Help needed to walk in hospital room?: Total Help needed climbing 3-5 steps with a railing? : Total 6 Click Score: 6    End of Session   Activity Tolerance: Patient tolerated treatment well Patient left: in bed;with bed alarm set;with nursing/sitter in room;with family/visitor present   PT Visit Diagnosis: Muscle weakness (generalized) (M62.81);Other abnormalities of gait and mobility (R26.89);Other symptoms and signs involving the nervous system (R29.898)     Time: [id number] PT Time Calculation (min) (ACUTE ONLY): 41 min  Charges:  $Therapeutic Activity: 23-37 mins                    Carmelia Bake, PT, DPT Acute Rehabilitation Services Office: [phone] Pager: [phone]  Trena Platt 10/18/2019, 1:33 PM

## 2019-10-18 NOTE — Progress Notes (Signed)
Courtney Heys, MD  Physician  Physical Medicine and Rehabilitation  PMR Pre-admission  Signed  Date of Service:  10/16/2019 5:51 PM      Related encounter: ED to Hosp-Admission (Discharged) from 10/14/2019 in [facility]      Signed         PMR Admission Coordinator Pre-Admission Assessment  Patient: Ryan Reed is an 68 y.o., male MRN: [id number] DOB: 1951/02/09 Height: '6\' 2"'  (188 cm) Weight: 90.7 kg                                                                                                                                                  Insurance Information HMO: yes    PPO:      PCP:      IPA:      80/20:      OTHER:  PRIMARY: [employer] Medicare      Policy#: [id number]      Subscriber: Patient CM Name: [name]      Phone#: [phone]     Fax#: [phone] Pre-Cert#: [id number]      Employer:  [employer] provided by [name] on 11/27 for admit to CIR with start date 11/27. Pt is approved for 7 days (11/27-12/3). Clinical updates due to (f): [phone] Benefits:  Phone #: online     Name: [email or web address] Eff. Date: 11/21/2018-11/21/2019     Deduct:does not have ($0)      Out of Pocket Max: $3,600 ($0 met)      Life Max:  CIR: $295/day co-pay for days 1-5, $0/day co-pay for days 6+      SNF: $0/day co-pay for days 1-20, $160/day co-pay for days 21-43, $0/day co-pay for days 44-100; limited to 100 days/cal yr Outpatient: limited by medical necessity     Co-Pay: $30/visit Home Health: 100% coverage; limited by medical necessity      Co-Pay: 0% co-insurance DME: 80% coverage      Co-Pay: 20% co-insurance Providers:  SECONDARY: None      Policy#:       Subscriber:  CM Name:       Phone#:      Fax#:  Pre-Cert#:       Employer:  Benefits:  Phone #:      Name:  Eff. Date:      Deduct:       Out of Pocket Max:      Life Max:  CIR:       SNF:  Outpatient:      Co-Pay:  Home Health:       Co-Pay:  DME:      Co-Pay:   Medicaid Application Date:       Case  Manager:  Disability Application Date:       Case Worker:   The "Data Collection Information Summary" for patients in Inpatient Rehabilitation Facilities  with attached "Privacy Act [place] Records" was provided and verbally reviewed with: Patient  Emergency Contact Information         Contact Information    Name Relation Home Work [place] Spouse [phone]  [phone]     Current Medical History  Patient Admitting Diagnosis:Impaired mobility and ADLs secondary to 7-T1 epidural abscess   History of Present Illness: Pt is a 68 yo Male with no pertinent medical history who was admitted to [facility] with an inability to move his legs and numbness to LLE. Pt was had been having mid back pain and had been having flu-like symptoms for approximately 2 weeks. He was beginning to have some numbness in his arms. 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 The patient was taken emergently to the OR for a C7-T7 posterior decompressive laminectomy, medial facetectomy and foraminotomy, with evacuation of ventral epidural abscess. Suspicion is abscess originated from poor dentition/tooth infection that he did not receive care for. Post op pt was placed on broad spectrum antibiotics which was narrowed to ceftriaxone until there are sensitiveities. Anticipate pt will be antibiotics for 8 weeks. Pt has been evaluated by therapies with recommendation for CIR. Pt is to admit to CIR on 10/18/2019 Glasgow Coma Scale Score: 15  Past Medical History  History reviewed. No pertinent past medical history.  Family History  family history is not on file.  Prior Rehab/Hospitalizations:  Has the patient had prior rehab or hospitalizations prior to admission? No  Has the patient had major surgery during 100 days prior to admission? Yes   Current Medications   Current Facility-Administered Medications:  .  0.9 %  sodium chloride infusion, 250 mL, Intravenous, Continuous, Kipp Brood, MD, Stopped at 10/15/19 0107 .  0.9 %  sodium chloride infusion, , Intravenous, PRN, Kipp Brood, MD, Stopped at 10/16/19 1807 .  acetaminophen (TYLENOL) tablet 650 mg, 650 mg, Oral, Q4H PRN, 650 mg at 10/17/19 1906 **OR** acetaminophen (TYLENOL) suppository 650 mg, 650 mg, Rectal, Q4H PRN, Agarwala, Ravi, MD .  cefTRIAXone (ROCEPHIN) 2 g in sodium chloride 0.9 % 100 mL IVPB, 2 g, Intravenous, Q12H, Carlyle Basques, MD, Last Rate: 200 mL/hr at 10/18/19 1123, 2 g at 10/18/19 1123 .  Chlorhexidine Gluconate Cloth 2 % PADS 6 each, 6 each, Topical, Daily, Kipp Brood, MD, 6 each at 10/18/19 1100 .  influenza vaccine adjuvanted (FLUAD) injection 0.5 mL, 0.5 mL, Intramuscular, Tomorrow-1000, [name], MD .  menthol-cetylpyridinium (CEPACOL) lozenge 3 mg, 1 lozenge, Oral, PRN **OR** phenol (CHLORASEPTIC) mouth spray 1 spray, 1 spray, Mouth/Throat, PRN, Agarwala, Ravi, MD .  methocarbamol (ROBAXIN) tablet 500 mg, 500 mg, Oral, Q6H PRN, 500 mg at 10/18/19 1113 **OR** methocarbamol (ROBAXIN) 500 mg in dextrose 5 % 50 mL IVPB, 500 mg, Intravenous, Q6H PRN, Agarwala, Ravi, MD .  metoprolol tartrate (LOPRESSOR) tablet 25 mg, 25 mg, Oral, BID, Agarwala, Ravi, MD, 25 mg at 10/18/19 1130 .  ondansetron (ZOFRAN) tablet 4 mg, 4 mg, Oral, Q6H PRN **OR** ondansetron (ZOFRAN) injection 4 mg, 4 mg, Intravenous, Q6H PRN, Agarwala, Ravi, MD .  oxyCODONE (Oxy IR/ROXICODONE) immediate release tablet 5 mg, 5 mg, Oral, Q3H PRN, Agarwala, Ravi, MD .  pneumococcal 23 valent vaccine (PNEUMOVAX-23) injection 0.5 mL, 0.5 mL, Intramuscular, Tomorrow-1000, [name], MD .  senna (SENOKOT) tablet 8.6 mg, 1 tablet, Oral, BID, Agarwala, Ravi, MD, 8.6 mg at  10/17/19 2151 .  sodium chloride flush (NS) 0.9 % injection 10-40 mL, 10-40 mL, Intracatheter, Q12H, [name],  MD, 10 mL at 10/18/19 1100 .  sodium chloride flush (NS) 0.9 % injection 10-40 mL, 10-40 mL, Intracatheter, PRN, [name], MD .  sodium chloride flush (NS) 0.9 % injection 3 mL, 3 mL, Intravenous, Q12H, Agarwala, Ravi, MD, 3 mL at 10/18/19 1100 .  sodium chloride flush (NS) 0.9 % injection 3 mL, 3 mL, Intravenous, PRN, Kipp Brood, MD  Patients Current Diet:     Diet Order                  Diet regular Room service appropriate? 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 Pt was able to drive PTA. Pt was independent PTA  Prior Functional Level Prior Function Level of Independence: Independent  Self Care: Did the patient need help bathing, dressing, using the toilet or eating?  Independent  Indoor Mobility: Did the patient need assistance with walking from room to room (with or without device)? Independent  Stairs: Did the patient need assistance with internal or external stairs (with or without device)? Independent  Functional Cognition: Did the patient need help planning regular tasks such as shopping or remembering to take medications? Independent  Home Assistive Devices / Equipment Home Assistive Devices/Equipment: None Home Equipment: None  Prior Device Use: Indicate devices/aids used by the patient prior to current illness, exacerbation or injury? None of the above  Current Functional Level Cognition  Overall Cognitive Status: Within Functional Limits for tasks assessed Orientation Level: Oriented X4    Extremity Assessment (includes Sensation/Coordination)  Upper Extremity Assessment: Defer to  OT evaluation RUE Deficits / Details: reports unable to touch pad to pad all five digits RUE Coordination: decreased fine motor LUE Deficits / Details: reports unable to touch pad to pad all five digits LUE Coordination: decreased fine motor, decreased gross motor  Lower Extremity Assessment: LLE deficits/detail, Generalized weakness, RLE deficits/detail RLE Deficits / Details: trace movement in add and IR RLE Sensation: decreased light touch RLE Coordination: decreased gross motor, decreased fine motor LLE Deficits / Details: no voluntary movement noted, minimal spontaneous movement LLE Sensation: decreased light touch LLE Coordination: decreased fine motor, decreased gross motor    ADLs  Overall ADL's : Needs assistance/impaired Eating/Feeding: Minimal assistance, Sitting Grooming: Oral care, Wash/dry face, Sitting, Minimal assistance Upper Body Bathing: Moderate assistance, Sitting Lower Body Bathing: Total assistance Toilet Transfer: Squat-pivot, Maximal assistance, +2 for physical assistance, +2 for safety/equipment Toilet Transfer Details (indicate cue type and reason): pt unable to sustain bil LE knee extension so pivot to chair with pad to simulate General ADL Comments: pt prop sitting eob this session with decrease trunk control noted. pt will need to contine to work on EOB static sitting to progress to static standing. pt will require bil LE blocking so stedy might be a good resource for attempting     Mobility  Overal bed mobility: Needs Assistance Bed Mobility: Rolling, Supine to Sit Rolling: Max assist Supine to sit: +2 for physical assistance, Max assist, +2 for safety/equipment General bed mobility  comments: pt requires total (A) at EOB. pt using bil Ue to attempt to prop into static sitting.     Transfers  General transfer comment: squat pivot with pad total +2 to chair . pt could benefit from sliding board or stedy to block bil LE    Ambulation / Gait / Stairs  / Wheelchair Mobility  Ambulation/Gait General Gait Details: unable    Posture / Balance Dynamic Sitting Balance Sitting balance - Comments: sat EOB propped up on UE's and with min/mod assist Balance Overall balance assessment: Needs assistance Sitting-balance support: Bilateral upper extremity supported, Feet supported Sitting balance-Leahy Scale: Poor Sitting balance - Comments: sat EOB propped up on UE's and with min/mod assist    Special needs/care consideration BiPAP/CPAP: no CPM: no Continuous Drip IV: ceftriaxone Dialysis: no        Days: no Life Vest: no Oxygen: no Special Bed: no Trach Size: no Wound Vac (area): no      Location: no Skin: surgical incision to the back,  2 posterior hemovac drains (10 Fr.)-Per Dr. Ellene Route, he was planning to pull drains on day of admit to rehab.       Bowel mgmt:incontient, last BM: 10/10/2019 Bladder mgmt: incontinent, 16 Fr. Urethral catheter Diabetic mgmt: no Behavioral consideration : no Chemo/radiation : no     Previous Home Environment (from acute therapy documentation) Living Arrangements: Spouse/significant other Available Help at Discharge: Family Type of Home: House Home Layout: One level Home Access: Stairs to enter Entrance Stairs-Rails: None Entrance Stairs-Number of Steps: 2 Bathroom Shower/Tub: Tub/shower unit, Walk-in shower, Curtain(very narrow door into the bathroom) Biochemist, clinical: [place]: No Additional Comments: works Biomedical scientist, 100 pound dog in the house, 8 chickens, big garden  Discharge [place] for Discharge Living Setting: Patient's home, Lives with (comment)(wife) Type of Home at Discharge: [place]: One level Discharge Home Access: Stairs to enter Entrance Stairs-Rails: None Entrance Stairs-Number of Steps: 2 Discharge Bathroom Shower/Tub: Tub/shower unit, Walk-in shower Discharge Bathroom Toilet: Handicapped height Discharge Bathroom  Accessibility: Yes How Accessible: Accessible via walker Does the patient have any problems obtaining your medications?: No  Social/Family/Support Systems Patient Roles: Spouse, Other (Comment)(full time employee) Contact Information: wife: Sharlot Gowda [phone] Anticipated Caregiver: wife-and has grown son and daughter that live nearby Anticipated Caregiver's Contact Information: see above Ability/Limitations of Caregiver: Min/Mod A  Caregiver Availability: 24/7 Discharge Plan Discussed with Primary Caregiver: Yes Is Caregiver In Agreement with Plan?: Yes Does Caregiver/Family have Issues with Lodging/Transportation while Pt is in Rehab?: No   Goals/Additional Needs Patient/Family Goal for Rehab: PT/OT: Min A; SLP: Independent Expected length of stay: 16-20 days Cultural Considerations: Christian Dietary Needs: regular diet, thin liquids  Equipment Needs: TBD Special Service Needs: SCI team Pt/Family Agrees to Admission and willing to participate: Yes Program Orientation Provided & Reviewed with Pt/Caregiver Including Roles  & Responsibilities: Yes(pt and wife)  Barriers to Discharge: Home environment access/layout, IV antibiotics, Neurogenic Bowel & Bladder  Barriers to Discharge Comments: bowel and bladder issues; wife open to ramp; will need IV antibiotics at DC   Decrease burden of Care through IP rehab admission: NA   Possible need for SNF placement upon discharge:Not anticipated; pt and family understand the goal is to DC home after CIR stay. Pt has good family support and a wife who is willing and able to physically assist. Family is open to environmental accommodations (ramp) as needed.    Patient Condition: This patient's medical and functional status has changed  since the consult dated: 10/16/2019 in which the Rehabilitation Physician determined and documented that the patient's condition is appropriate for intensive rehabilitative care in an inpatient  rehabilitation facility. See "History of Present Illness" (above) for medical update. Functional changes are: none; pt has remained at the same functional level since consult note placed. Patient's medical and functional status update has been discussed with the Rehabilitation physician and patient remains appropriate for inpatient rehabilitation. Will admit to inpatient rehab today.  Preadmission Screen Completed By:  Raechel Ache, OT, 10/18/2019 1:12 PM ______________________________________________________________________   Discussed status with Dr. Dagoberto Ligas on 10/18/2019 at 1:12PM and received approval for admission today.  Admission Coordinator:  Raechel Ache, time 1:12 PM/Date 10/18/2019         Revision History Date/Time User Provider Type Action  10/18/2019 1:18 PM Courtney Heys, MD Physician Sign  10/18/2019 1:12 PM Raechel Ache, OT Rehab Admission Coordinator Share  View Details Report

## 2019-10-18 NOTE — Progress Notes (Signed)
Ryan Ribas, MD  Physician  Physical Medicine and Rehabilitation  Consult Note  Addendum  Date of Service:  10/16/2019 9:47 AM      Related encounter: ED to Hosp-Admission (Discharged) from 10/14/2019 in [facility]      Expand All Collapse All         Physical Medicine and Rehabilitation Consult   Reason for Consult:Functional deficits due to paraplegia/epiduaral abscess.  Referring Physician: Dr. Lynetta Mare   HPI: Ryan Reed is a 68 y.o. male with history of alcohol abuse, normocytic anemia, possible undiagnosed OSA with reports of severe back and chest pain who was seen at urgent care 10/13/19 recommendations to go to ED. 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 He was taken to OR emergently for C7-T7 posterior decompressive laminectomy with medial facetectomy and foraminotomy for evacuation of epidural abscess by Dr. Ronnald Ramp. Post op with ABLA requiring 4 units PRBC as well a A fib treated with BB.  Wound culture with abundant Gemella Morbillorum and currently on Vancomycin/defepime.  2D echo with EF 60-65% with increased LVH.   Dr. Baxter Flattery consulted for input and recommended likely 8 weeks of IV antibiotic therapy pending finalization of cultures. PT evaluation done and patient with limitations due to paraplegia. CIR recommended due to functional decline.     ROS Negative except as indicated in H&P.   History reviewed. No pertinent past medical history.         Past Surgical History:  Procedure Laterality Date   THORACIC LAMINECTOMY FOR EPIDURAL ABSCESS N/A 10/14/2019   Procedure: Cervical seven - THORACIC seven LAMINECTOMIES FOR EPIDURAL ABSCESS;  Surgeon: [name], MD;  Location: [place];  Service: Neurosurgery;  Laterality: N/A;    History reviewed. No pertinent family history.    Social History:  has no history on file for tobacco, alcohol, and drug.    Allergies: No Known Allergies          Medications Prior to Admission  Medication Sig Dispense Refill   HYDROcodone-acetaminophen (NORCO/VICODIN) 5-325 MG tablet Take 1 tablet by mouth every 6 (six) hours as needed for moderate pain.     tiZANidine (ZANAFLEX) 2 MG tablet Take 1 mg by mouth every 6 (six) hours as needed for muscle spasms.      Home: Home Living Family/patient expects to be discharged to:: Private residence Living Arrangements: Spouse/significant other Available Help at Discharge: Family Type of Home: House Home Access: Stairs to enter Technical brewer of Steps: 2 Entrance Stairs-Rails: None Home Layout: One level Bathroom Shower/Tub: Tub/shower unit, Walk-in shower, Curtain(very narrow door into the bathroom) Biochemist, clinical: Standard Home Equipment: None Additional Comments: works Biomedical scientist, 100 pound dog in the house, 8 chickens, big garden  Functional History: Prior Function Level of Independence: Independent Functional Status:  Mobility: Bed Mobility Overal bed mobility: Needs Assistance Bed Mobility: Rolling, Supine to Sit Rolling: Max assist Supine to sit: +2 for physical assistance, Max assist, +2 for safety/equipment General bed mobility comments: pt requires total (A) at EOB. pt using bil Ue to attempt to prop into static sitting.  Transfers General transfer comment: squat pivot with pad total +2 to chair . pt could benefit from sliding board or stedy to block bil LE Ambulation/Gait General Gait Details: unable  ADL: ADL Overall ADL's : Needs assistance/impaired Eating/Feeding: Minimal assistance, Sitting Grooming: Oral care, Wash/dry face, Sitting, Minimal assistance Upper Body Bathing: Moderate assistance, Sitting Lower Body Bathing: Total  assistance Toilet Transfer: Squat-pivot, Maximal assistance, +2 for physical assistance, +2 for safety/equipment Toilet Transfer Details (indicate cue type and reason): pt unable to sustain bil LE knee extension so pivot to chair with pad to simulate General ADL Comments: pt prop sitting eob this session with decrease trunk control noted. pt will need to contine to work on EOB static sitting to progress to static standing. pt will require bil LE blocking so stedy might be a good resource for attempting   Cognition: Cognition Overall Cognitive Status: Within Functional Limits for tasks assessed Orientation Level: Oriented X4 Cognition Arousal/Alertness: Awake/alert Behavior During Therapy: WFL for tasks assessed/performed Overall Cognitive Status: Within Functional Limits for tasks assessed  Blood pressure 129/62, pulse 87, temperature 99.1 F (37.3 C), temperature source Oral, resp. rate 12, height 6\' 2"  (1.88 m), weight 90.7 kg, SpO2 97 %. Physical Exam Gen: no distress, normal appearing HEENT: oral mucosa pink and moist, NCAT Cardio: Reg rate Chest: normal effort, normal rate of breathing Abd: soft, non-distended Ext: no edema Skin: intact Neuro: AOx3. Musculoskeletal: 0/5 strength in b/l lower extremities except for trace toe movement on the left. 5/5 in upper extremities but with severely impaired coordination. Sensation limited throughout bilateral lower extremities.  Psych: pleasant, normal affect  Lab Results Last 24 Hours       Results for orders placed or performed during the hospital encounter of 10/14/19 (from the past 24 hour(s))  CBC     Status: Abnormal   Collection Time: 10/15/19 11:48 AM  Result Value Ref Range   WBC 18.4 (H) 4.0 - 10.5 K/uL   RBC 2.95 (L) 4.22 - 5.81 MIL/uL   Hemoglobin 9.0 (L) 13.0 - 17.0 g/dL   HCT 25.8 (L) 39.0 - 52.0 %   MCV 87.5 80.0 - 100.0 fL   MCH 30.5 26.0 - 34.0 pg   MCHC 34.9 30.0 - 36.0 g/dL   RDW 13.3 11.5 - 15.5 %     Platelets 390 150 - 400 K/uL   nRBC 0.0 0.0 - 0.2 %      Imaging Results (Last 48 hours)  Dg Thoracic Spine W/swimmers  Result Date: 10/14/2019 CLINICAL DATA:  Thoracic laminectomy for epidural abscess EXAM: THORACIC SPINE - 3 VIEWS; DG C-ARM 1-60 MIN COMPARISON:  MRI from earlier in the same day. FLUOROSCOPY TIME:  Fluoroscopy Time:  6 seconds Radiation Exposure Index (if provided by the fluoroscopic device): Not available Number of Acquired Spot Images: 1 FINDINGS: Lateral radiograph of the cervicothoracic junction was performed for localization for thoracic laminectomy. Surgical retractors and instruments are noted posterior to the C7 and T1 vertebral bodies. IMPRESSION: Intraoperative localization at the cervicothoracic junction. Electronically Signed   By: Inez Catalina M.D.   On: 10/14/2019 23:47   Mr Cervical Spine W Wo Contrast  Result Date: 10/14/2019 CLINICAL DATA:  Back pain. Bilateral lower extremity weakness. Febrile. EXAM: MRI CERVICAL SPINE WITHOUT AND WITH CONTRAST TECHNIQUE: Multiplanar and multiecho pulse sequences of the cervical spine, to include the craniocervical junction and cervicothoracic junction, were obtained without and with intravenous contrast. CONTRAST:  58mL GADAVIST GADOBUTROL 1 MMOL/ML IV SOLN COMPARISON:  None. FINDINGS: Alignment: Normal alignment. Image quality degraded by motion. Vertebrae: Negative for fracture or mass. No evidence of discitis. There is edema in the facet and surrounding soft tissues on the left at  C6-7. Posterior rim enhancing fluid collection posterior to the facet measuring approximately 12 mm likely a soft tissue abscess. Cord: Cord evaluation limited by motion. Ventral epidural rim enhancing fluid collection from C7 into the thoracic spine compatible with epidural abscess. This is a large fluid collection with compression of the thoracic cord. Posterior Fossa, vertebral arteries, paraspinal tissues: Posterior paraspinous rim enhancing  fluid collection behind the left C7-T1 facet compatible with septic arthritis an abscess related to the facet joint. There is posterior lateral epidural thickening on the left at this level with a 4 x 10 mm abscess in the lateral epidural space. Disc levels: C2-3: Bilateral facet degeneration causing foraminal stenosis bilaterally. C3-4: Bilateral facet degeneration causing foraminal encroachment bilaterally. Small central disc protrusion. C4-5: Disc degeneration and spondylosis. Left facet degeneration. Bilateral foraminal stenosis and mild spinal stenosis C5-6: Disc degeneration and spondylosis. Mild spinal stenosis and bilateral facets foraminal stenosis due to spurring C6-7: Disc degeneration and spondylosis. Mild spinal stenosis and moderate foraminal stenosis bilaterally C7-T1: Left facet joint effusion with enhancement of the surrounding soft tissues and a soft tissue abscess posterior to the left facet joint. Lateral epidural thickening with 4 x 10 mm lateral epidural abscess. IMPRESSION: 1. Very large ventral epidural abscess from C7 into the thoracic spine causing cord compression. 2. Left facet joint septic arthritis at C7-T1 with surrounding soft tissue abscess. 3. Left lateral epidural abscess at C7-T1. 4. 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 5. These results were called by telephone at the time of interpretation on 10/14/2019 at 5:38 pm to ED provider , who verbally acknowledged these results. Electronically Signed   By: Franchot Gallo M.D.   On: 10/14/2019 17:39   Mr Thoracic Spine W Wo Contrast  Result Date: 10/14/2019 CLINICAL DATA:  Back pain.  Bilateral leg weakness.  Febrile. EXAM: MRI THORACIC WITHOUT AND WITH CONTRAST TECHNIQUE: Multiplanar and multiecho pulse sequences of the thoracic spine were obtained without and with intravenous contrast. CONTRAST:  25mL GADAVIST GADOBUTROL 1 MMOL/ML IV SOLN COMPARISON:  None. FINDINGS: MRI THORACIC SPINE FINDINGS Alignment:   Normal alignment. Vertebrae: Negative for fracture or mass.  No evidence of discitis. Cord: Marked cord compression due to a large ventral epidural fluid collection extending from C7-T7. The fluid collection has peripheral enhancement and is most compatible with epidural abscess which appears be arising from the left C7-T1 facet joint which appears infected. The fluid collection occupies approximately 2/3 of the spinal canal diameter with posterior compression of the cord. Paraspinal and other soft tissues: No pleural effusion. C7-T1 facet joint on the left shows abnormal enhancement and a fluid collection extending into the posterior soft tissues compatible with abscess. Remaining thoracic spine soft tissues normal. Disc levels: Disc space narrowing and disc degeneration throughout the upper and midthoracic spine. No focal disc protrusion. IMPRESSION: Very large ventral epidural abscess extending from C7-T1. There is marked cord compression. The abscess occupies approximately 2/3 of the anterior spinal canal. The abscess appears be arising from septic arthritis involving the left C7-T1 facet joint. No evidence of discitis. These results were called by telephone at the time of interpretation on 10/14/2019 at 5:52 pm to ED provider , who verbally acknowledged these results. Electronically Signed   By: Franchot Gallo M.D.   On: 10/14/2019 17:54   Mr Lumbar Spine W Wo Contrast (assess For Abscess, Cord Compression)  Result Date: 10/14/2019 CLINICAL DATA:  Back pain.  Bilateral leg weakness.  Febrile. EXAM: MRI LUMBAR SPINE WITHOUT AND WITH CONTRAST TECHNIQUE:  Multiplanar and multiecho pulse sequences of the lumbar spine were obtained without and with intravenous contrast. CONTRAST:  67mL GADAVIST GADOBUTROL 1 MMOL/ML IV SOLN COMPARISON:  None. FINDINGS: Segmentation:  Normal Alignment:  Mild retrolisthesis L2-3 and L3-4 Vertebrae: Mild compression fracture superior endplate of L4 with mild edema and bone marrow  enhancement extending to the right. This may be a recent fracture. No other fracture or mass lesion. Conus medullaris and cauda equina: Conus extends to the L1 level. Conus and cauda equina appear normal. Paraspinal and other soft tissues: Negative for paraspinous mass or adenopathy. No soft tissue abscess or fluid collection. Disc levels: L1-2: Mild degenerative change.  Negative for stenosis L2-3: Disc bulging and moderate facet degeneration causing mild spinal stenosis L3-4: Disc degeneration with disc bulging and endplate spurring. Left foraminal and extraforaminal disc protrusion causing impingement of the left L4 nerve root in the subarticular zone. Moderate facet degeneration and moderate spinal stenosis. L4-5: Asymmetric disc degeneration and spurring on the left. Moderate to severe subarticular and foraminal stenosis on the left due to spurring. Mild spinal stenosis L5-S1: Small central annular tear without significant disc protrusion or stenosis. IMPRESSION: 1. Negative for infection in the lumbar spine. 2. 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 Mild fracture superior endplate of L4 with bone marrow enhancement on the right suggestive of a recent fracture. Electronically Signed   By: Franchot Gallo M.D.   On: 10/14/2019 17:46   Dg Chest Port 1 View  Result Date: 10/14/2019 CLINICAL DATA:  Weakness EXAM: PORTABLE CHEST 1 VIEW COMPARISON:  None. FINDINGS: The heart size and mediastinal contours are within normal limits. Both lungs are clear. The visualized skeletal structures are unremarkable. IMPRESSION: No active disease. Electronically Signed   By: Davina Poke M.D.   On: 10/14/2019 11:19   Dg C-arm 1-60 Min  Result Date: 10/14/2019 CLINICAL DATA:  Thoracic laminectomy for epidural abscess EXAM: THORACIC SPINE - 3 VIEWS; DG C-ARM 1-60 MIN COMPARISON:  MRI from earlier in the same day. FLUOROSCOPY  TIME:  Fluoroscopy Time:  6 seconds Radiation Exposure Index (if provided by the fluoroscopic device): Not available Number of Acquired Spot Images: 1 FINDINGS: Lateral radiograph of the cervicothoracic junction was performed for localization for thoracic laminectomy. Surgical retractors and instruments are noted posterior to the C7 and T1 vertebral bodies. IMPRESSION: Intraoperative localization at the cervicothoracic junction. Electronically Signed   By: Inez Catalina M.D.   On: 10/14/2019 23:47   [place] Ekg Site Rite  Result Date: 10/16/2019 If Site Rite image not attached, placement could not be confirmed due to current cardiac rhythm.     Assessment/Plan: Diagnosis: Impaired mobility and ADLs secondary to 7-T1 epidural abscess.  1. Does the need for close, 24 hr/day medical supervision in concert with the patient's rehab needs make it unreasonable for this patient to be served in a less intensive setting? Yes 2. Co-Morbidities requiring supervision/potential complications: alcohol abuse, normocytic anemia 3. Due to bladder management, bowel management, safety, skin/wound care, disease management, medication administration, pain management and patient education, does the patient require 24 hr/day rehab nursing? Yes 4. Does the patient require coordinated care of a physician, rehab nurse, therapy disciplines of PT, OT to address physical and functional deficits in the context of the above medical diagnosis(es)? Yes Addressing deficits in the following areas: balance, endurance, locomotion, strength, transferring, bowel/bladder control, bathing, dressing, feeding, grooming, toileting, cognition and psychosocial support 5.  Can the patient actively participate in an intensive therapy program of at least 3 hrs of therapy per day at least 5 days per week? Yes 6. The potential for patient to make measurable gains while on inpatient rehab is good 7. Anticipated functional outcomes upon discharge from  inpatient rehab are min assist  with PT, min assist with OT, independent with SLP. 8. Estimated rehab length of stay to reach the above functional goals is: 10-14 days 9. Anticipated discharge destination: Home 10. Overall Rehab/Functional Prognosis: good  RECOMMENDATIONS: This patient's condition is appropriate for continued rehabilitative care in the following setting: CIR Patient has agreed to participate in recommended program. Yes Note that insurance prior authorization may be required for reimbursement for recommended care.  Comment: Mr. Artrip would be an excellent inpatient rehabilitation candidate and would be able to tolerate 3 hours of therapy. He would benefit from Melatonin added at night to help him fall asleep.   Bary Leriche, PA-C 10/16/2019   I have personally performed a face to face diagnostic evaluation, including, but not limited to relevant history and physical exam findings, of this patient and developed relevant assessment and plan.  Additionally, I have reviewed and concur with the physician assistant's documentation above.  Leeroy Cha, MD    Revision History Date/Time User Provider Type Action  10/16/2019 12:35 PM Ranell Patrick, Clide Deutscher, MD Physician Addend  10/16/2019 12:34 PM Ranell Patrick, Clide Deutscher, MD Physician Sign  10/16/2019 10:24 AM Love, Ivan Anchors, PA-C Physician Assistant Pend  View Details Report     Routing History

## 2019-10-18 NOTE — Care Management Important Message (Signed)
Important Message  Patient Details  Name: Ryan Reed MRN: [id number] Date of Birth: 1951/01/26   Medicare Important Message Given:  Yes     Shelda Altes 10/18/2019, 2:24 PM

## 2019-10-18 NOTE — Progress Notes (Signed)
Patient ID: Ryan Reed, male   DOB: 12-06-1950, 68 y.o.   MRN: [id number] Admit to unit, reviewed medications, rehab schedule and plan of care with pt and wife. Stated an understanding of information reviewed. Margarito Liner

## 2019-10-18 NOTE — Plan of Care (Signed)
Patient stable, discussed POC with patient and spouse, agreeable with plan, denies question/concerns at this time.  

## 2019-10-18 NOTE — Evaluation (Addendum)
Occupational Therapy Assessment and Plan  Patient Details  Name: Ryan Reed MRN: [id number] Date of Birth: 1951/07/02  OT Diagnosis: muscle weakness (generalized) and paraplegia Rehab Potential: Rehab Potential (ACUTE ONLY): Good ELOS: 4 weeks   Today's Date: 10/19/2019 OT Individual Time: 0802-0900 OT Individual Time Calculation (min): 58 min     Problem List:  Patient Active Problem List   Diagnosis Date Noted  . Quadriplegia ([place]) 10/19/2019  . 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 Epidural abscess 10/14/2019    Past Medical History: No past medical history on file. Past Surgical History:  Past Surgical History:  Procedure Laterality Date  . LACERATION REPAIR Left    left thigh  . THORACIC LAMINECTOMY FOR EPIDURAL ABSCESS N/A 10/14/2019   Procedure: Cervical seven - THORACIC seven LAMINECTOMIES FOR EPIDURAL ABSCESS;  Surgeon: [name], MD;  Location: [place];  Service: Neurosurgery;  Laterality: N/A;    Assessment & Plan Clinical Impression: Patient is a 68 y.o. year old male with recent admission to the hospital on in relatively good health except for possibly undiagnosed OSA, normocytic anemia who was developed severe back and chest pain--seen at urgent care on 10/13/19 with recommendations to go to ED but declined. He developed acute onset of paraplegia with sensory loss and was admitted on 10/14/19 for work up. 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  He was taken to the OR emergently for C7-T7 posterior decompressive laminectomy with medial facetectomy and foraminotomy for evacuation of epidural abscess by Dr. Ronnald Ramp.  Postop had acute blood loss anemia requiring 4 units of packed red blood cells as well as A. fib requiring beta-blocker for rate control.  Wound culture shows  abundant Gemella morbilliform and he was started on Vanco/cefepime initially--this was narrowed to Rocephin 2 g twice daily on 11/25.   2D echo done showed EF of 60 to 65% with increased LVH--no evidence of endocarditis.  Dr. Graylon Good recommended 8 weeks of IV antibiotic therapy and will follow up for further recommendations.  Patient with paraplegia and sensory deficits from chest down.  Foley in place due to neurogenic bladder and he has not had a bowel movement except for small accident this a.m.  Patient transferred to CIR on 10/18/2019 .    Patient currently requires total with basic self-care skills secondary to muscle weakness and muscle paralysis, decreased cardiorespiratoy endurance and decreased sitting balance, decreased standing balance, decreased postural control, decreased balance strategies and difficulty maintaining precautions.  Prior to hospitalization, patient could complete BALD with independent .  Patient will benefit from skilled intervention to decrease level of assist with basic self-care skills and increase independence with basic self-care skills prior to discharge home with care partner.  Anticipate patient will require minimal physical assistance and follow up home health.  OT - End of Session Endurance Deficit: Yes Endurance Deficit Description: Rest breaks within BADL tasks OT Assessment Rehab Potential (ACUTE ONLY): Good OT Patient demonstrates impairments in the following area(s): Balance;Endurance;Motor;Sensory OT Basic ADL's Functional Problem(s): Grooming;Bathing;Dressing;Toileting OT Transfers Functional Problem(s): Toilet;Tub/Shower OT Additional Impairment(s): Fuctional Use of Upper Extremity OT Plan OT Intensity: Minimum of 1-2 x/day, 45 to 90 minutes OT Frequency: 5 out of 7 days OT Duration/Estimated Length of Stay: 4 weeks OT Treatment/Interventions: Balance/vestibular training;Community reintegration;Discharge planning;Disease  mangement/prevention;DME/adaptive equipment instruction;Functional electrical stimulation;Functional mobility training;Pain management;Neuromuscular re-education;Patient/family education;Psychosocial  support;Self Care/advanced ADL retraining;Skin care/wound managment;Splinting/orthotics;Therapeutic Activities;Therapeutic Exercise;UE/LE Strength taining/ROM;UE/LE Coordination activities;Wheelchair propulsion/positioning OT Self Feeding Anticipated Outcome(s): Mod I OT Basic Self-Care Anticipated Outcome(s): Min A OT Toileting Anticipated Outcome(s): Min A OT Bathroom Transfers Anticipated Outcome(s): Min A toilet, Mod  A tub/shower OT Recommendation Recommendations for Other Services: Therapeutic Recreation consult Therapeutic Recreation Interventions: Outing/community reintergration Patient destination: Home Follow Up Recommendations: Home health OT Equipment Recommended: To be determined   Skilled Therapeutic Intervention Pt greeted semi-reclined in bed and agreeable to OT eval and treat. OT eval completed addressing rehab process, OT purpose, POC, ELOS, and goals. Reviewed back precautions with pt. Pt completed bed level bathing/dressing with pt able to wash upperbody and peri area with HOB elevating. Rolling L and R with max A overall. Pt with no activation in LEs, but able to control trunk and UEs. Total A for LB bathing/dressing from bed level. Worked on log roll, then sidelying to sitting with total A for LB, but pt able to push trunk up with Max A. Once sitting, pt with descent trunk control requiring Mod/max A for sitting balance with UB support, and more max/total A for short time without UB support. Pt tolerated sitting EOB for 5 minutes. Max/total A to return to bed with pt able to assist with managing upper body. Bed placed in trendelenburg for total A to scoot up in bed. Pt left semi-reclined in bed with nursing present and needs met.    OT Evaluation Precautions/Restrictions   Precautions Precautions: Cervical;Back Restrictions Weight Bearing Restrictions: No Pain  denies pain Home Living/Prior Functioning Home Living Available Help at Discharge: Family Type of Home: House Home Access: Stairs to enter Technical brewer of Steps: 2 Entrance Stairs-Rails: None Home Layout: One level Bathroom Shower/Tub: Tub/shower unit, Walk-in shower, Curtain(very narrow door into the bathroom) Biochemist, clinical: Standard Additional Comments: works Biomedical scientist, 100 pound dog in the house, 8 chickens, big garden  Lives With: Spouse Prior Function Level of Independence: Independent with basic ADLs, Independent with homemaking with ambulation  Able to Take Stairs?: Yes Driving: Yes Comments: Active and independent PTA, has children and grandchildren in the area ADL ADL Eating: Set up Grooming: Setup Upper Body Bathing: Minimal assistance, Moderate assistance Lower Body Bathing: Maximal assistance, Dependent Upper Body Dressing: Minimal assistance, Moderate assistance Lower Body Dressing: Dependent Toileting: Dependent Toilet Transfer: Unable to assess Tub/Shower Transfer: Unable to assess Vision Baseline Vision/History: Wears glasses Wears Glasses: Reading only Vision Assessment?: No apparent visual deficits Perception  Perception: Within Functional Limits Praxis Praxis: Intact Cognition Overall Cognitive Status: Within Functional Limits for tasks assessed Arousal/Alertness: Awake/alert Orientation Level: Person;Place;Situation Person: Oriented Place: Oriented Situation: Oriented Year: 2020 Month: November Day of Week: Correct Memory: Appears intact Immediate Memory Recall: Sock;Bed;Blue Memory Recall Sock: Without Cue Memory Recall Blue: Without Cue Memory Recall Bed: Without Cue Awareness: Appears intact Problem Solving: Appears intact Safety/Judgment: Appears intact Sensation Sensation Light Touch: Impaired Detail Light Touch Impaired  Details: Impaired RUE;Impaired LUE;Impaired RLE;Impaired LLE(R>than L in UE) Additional Comments: mild decreased appreciation to light touch in BLE L>R Coordination Gross Motor Movements are Fluid and Coordinated: No Fine Motor Movements are Fluid and Coordinated: No Finger Nose Finger Test: [facility] Heel Shin Test: unable to perform Motor  Motor Motor: Paraplegia Motor - Skilled Clinical Observations: BLE paraplegia Mobility  Bed Mobility Bed Mobility: Rolling Right;Rolling Left;Sit to Supine;Supine to Sit Rolling Right: Maximal Assistance - Patient 25-49% Rolling Left: Maximal Assistance - Patient 25-49% Supine to Sit: Total Assistance - Patient < 25% Sit  to Supine: Total Assistance - Patient < 25%  Trunk/Postural Assessment  Cervical Assessment Cervical Assessment: Exceptions to WFL(forward head, unable to sustain cervical extension) Thoracic Assessment Thoracic Assessment: Exceptions to WFL(rounded shoulders, unable to sustain trunk extension to neutral for >30 sec.) Lumbar Assessment Lumbar Assessment: Exceptions to WFL(decreased lordosis) Postural Control Postural Control: Deficits on evaluation(poor righting reactions in all directions)  Balance Balance Balance Assessed: Yes Static Sitting Balance Static Sitting - Balance Support: Bilateral upper extremity supported;Left upper extremity supported;Feet supported Static Sitting - Level of Assistance: 2: Max assist Dynamic Sitting Balance Dynamic Sitting - Balance Support: Bilateral upper extremity supported Dynamic Sitting - Level of Assistance: 2: Max assist;1: +1 Total assist Static Standing Balance Static Standing - Level of Assistance: Not tested (comment) Extremity/Trunk Assessment RUE Assessment General Strength Comments: 4-/5 general strength in R UE, 4-/5 grip strengthn. Notes numbness in mild R hand more than L LUE Assessment LUE Assessment: Within Functional Limits General Strength Comments: 4/5 overall- able to  use UE functionally. Pt is L handed     Refer to Care Plan for Long Term Goals  Recommendations for other services: Therapeutic Recreation  Outing/community reintegration   Discharge Criteria: Patient will be discharged from OT if patient refuses treatment 3 consecutive times without medical reason, if treatment goals not met, if there is a change in medical status, if patient makes no progress towards goals or if patient is discharged from hospital.  The above assessment, treatment plan, treatment alternatives and goals were discussed and mutually agreed upon: by patient  Valma Cava 10/19/2019, 2:26 PM

## 2019-10-18 NOTE — TOC Transition Note (Signed)
Transition of Care [facility]) - CM/SW Discharge Note   Patient Details  Name: Ryan Reed MRN: [id number] Date of Birth: Dec 25, 1950  [facility]) CM/SW Contact:  Pollie Friar, RN Phone Number: 10/18/2019, 2:00 PM   Clinical Narrative:    Pt is discharging to CIR today. CM signing off.    Final next level of care: OP Rehab Barriers to Discharge: No Barriers Identified   Patient Goals and CMS Choice        Discharge Placement                       Discharge Plan and Services                                     Social Determinants of Health (SDOH) Interventions     Readmission Risk Interventions No flowsheet data found.

## 2019-10-18 NOTE — Discharge Summary (Signed)
Physician Discharge Summary  Patient ID: AARSH BORTON MRN: [id number] DOB/AGE: [birth year] 68 y.o.  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  Infection with Gemella Morbillorum Principal Problem:   Epidural abscess Active Problems:   Paraplegia ([place])   Gemella infection   Discharged Condition: fair  Hospital Course: Patient was admitted from the emergency room after he was noted he developed a rapidly progressive paraplegia in the lower extremities.  There is found that he had an epidural abscess in the region of the thoracic spine he underwent a C7-T7 thoracic laminectomy.  Pus was drained.  He has been started on antibiotics.  He is recovering but lower extremity function is still absent.  He does have sensation.  He is transferred to the rehab service for further inpatient comprehensive rehab  Consults: rehabilitation medicine  Significant Diagnostic Studies: Culture  Treatments: surgery: C7-T7 laminectomy and evacuation of abscess  Discharge Exam: Blood pressure 128/63, pulse 80, temperature 98.3 F (36.8 C), temperature source Oral, resp. rate 20, height 6\' 2"  (1.88 m), weight 90.7 kg, SpO2 100 %. Incision is clean and dry.  No function in the lower extremities.  Good sensation in lower extremities.  No motor function.  Disposition: Discharge disposition: [place] Not Defined       Discharge Instructions    Call MD for:  redness, tenderness, or signs of infection (pain, swelling, redness, odor or green/yellow discharge around incision site)   Complete by: As directed    Call MD for:  severe uncontrolled pain   Complete by: As directed    Call MD for:  temperature >100.4   Complete by: As directed    Diet - low sodium heart healthy   Complete by: As directed     Increase activity slowly   Complete by: As directed      Allergies as of 10/18/2019   No Known Allergies     Medication List    TAKE these medications   HYDROcodone-acetaminophen 5-325 MG tablet Commonly known as: NORCO/VICODIN Take 1 tablet by mouth every 6 (six) hours as needed for moderate pain.   tiZANidine 2 MG tablet Commonly known as: ZANAFLEX Take 1 mg by mouth every 6 (six) hours as needed for muscle spasms.      Follow-up Information    Carlyle Basques, MD Follow up on 11/13/2019.   Specialty: Infectious Diseases Why: 2:15 pm appointment. Can convert this to a video visit via your MyChart account for safety.  Contact information: [address] [place] [id number] [phone]           Signed: Earleen Newport 10/18/2019, 1:26 PM

## 2019-10-18 NOTE — H&P (Signed)
Physical Medicine and Rehabilitation Admission H&P     CC:  Functional deficits due to paraplegia/epiduarl abscess.      HPI:  Ryan Reed is a 68 year old male in relatively good health except for possibly undiagnosed OSA, normocytic anemia who was developed severe back and chest pain--seen at urgent care on 10/13/19 with recommendations to go to ED but declined. He developed acute onset of paraplegia with sensory loss and was admitted on 10/14/19 for work up. 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  He was taken to the OR emergently for C7-T7 posterior decompressive laminectomy with medial facetectomy and foraminotomy for evacuation of epidural abscess by Dr. Ronnald Ramp.  Postop had acute blood loss anemia requiring 4 units of packed red blood cells as well as A. fib requiring beta-blocker for rate control.  Wound culture shows abundant Gemella morbilliform and he was started on Vanco/cefepime initially--this was narrowed to Rocephin 2 g twice daily on 11/25.   2D echo done showed EF of 60 to 65% with increased LVH--no evidence of endocarditis.  Dr. Graylon Good recommended 8 weeks of IV antibiotic therapy and will follow up for further recommendations.  Patient with paraplegia and sensory deficits from chest down.  Foley in place due to neurogenic bladder and he has not had a bowel movement except for small accident this a.m.  Therapy has been ongoing and working on bed mobility.  CIR recommended due to functional decline     Review of Systems  Constitutional: Negative for chills and fever.  HENT: Negative for hearing loss and tinnitus.   Eyes: Negative for blurred vision and double vision.  Respiratory: Negative for cough and shortness of breath.   Cardiovascular: Negative for chest pain and palpitations.  Gastrointestinal: Positive for abdominal pain  and diarrhea. Negative for heartburn and nausea.  Genitourinary: Negative for dysuria and urgency.  Musculoskeletal: Positive for back pain and myalgias.  Skin: Negative for rash.  Neurological: Positive for speech change, focal weakness and weakness. Negative for dizziness and headaches.  Psychiatric/Behavioral: The patient does not have insomnia.         History reviewed. No pertinent past medical history.      Past Surgical History:  Procedure Laterality Date  . LACERATION REPAIR Left      left thigh  . THORACIC LAMINECTOMY FOR EPIDURAL ABSCESS N/A 10/14/2019    Procedure: Cervical seven - THORACIC seven LAMINECTOMIES FOR EPIDURAL ABSCESS;  Surgeon: [name], MD;  Location: [place];  Service: Neurosurgery;  Laterality: N/A;        History reviewed. No pertinent family history.      Social History:  Married. Moved from [place] 2 years ago. 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 He does not use tobacco, dip snuff or vapes. He drinks a 2 beers daily. He denies use of illicit drugs.       Allergies: No Known Allergies            Medications Prior to Admission  Medication Sig Dispense Refill  . HYDROcodone-acetaminophen (NORCO/VICODIN) 5-325 MG tablet Take 1 tablet by mouth every 6 (six) hours as needed for moderate pain.      Marland Kitchen tiZANidine (ZANAFLEX) 2 MG tablet Take 1 mg by mouth every 6 (six) hours as needed for muscle  spasms.          Drug Regimen Review  Drug regimen was reviewed and remains appropriate with no significant issues identified   Home: Home Living Family/patient expects to be discharged to:: Private residence Living Arrangements: Spouse/significant other Available Help at Discharge: Family Type of Home: House Home Access: Stairs to enter Technical brewer of Steps: 2 Entrance Stairs-Rails: None Home Layout: One level Bathroom Shower/Tub: Tub/shower unit, Walk-in shower,  Curtain(very narrow door into the bathroom) Biochemist, clinical: Standard Home Equipment: None Additional Comments: works Biomedical scientist, 100 pound dog in the house, 8 chickens, big garden   Functional History: Prior Function Level of Independence: Independent   Functional Status:  Mobility: Bed Mobility Overal bed mobility: Needs Assistance Bed Mobility: Rolling, Supine to Sit Rolling: Max assist Supine to sit: +2 for physical assistance, Max assist, +2 for safety/equipment General bed mobility comments: pt requires total (A) at EOB. pt using bil Ue to attempt to prop into static sitting.  Transfers General transfer comment: squat pivot with pad total +2 to chair . pt could benefit from sliding board or stedy to block bil LE Ambulation/Gait General Gait Details: unable   ADL: ADL Overall ADL's : Needs assistance/impaired Eating/Feeding: Minimal assistance, Sitting Grooming: Oral care, Wash/dry face, Sitting, Minimal assistance Upper Body Bathing: Moderate assistance, Sitting Lower Body Bathing: Total assistance Toilet Transfer: Squat-pivot, Maximal assistance, +2 for physical assistance, +2 for safety/equipment Toilet Transfer Details (indicate cue type and reason): pt unable to sustain bil LE knee extension so pivot to chair with pad to simulate General ADL Comments: pt prop sitting eob this session with decrease trunk control noted. pt will need to contine to work on EOB static sitting to progress to static standing. pt will require bil LE blocking so stedy might be a good resource for attempting    Cognition: Cognition Overall Cognitive Status: Within Functional Limits for tasks assessed Orientation Level: Oriented X4 Cognition Arousal/Alertness: Awake/alert Behavior During Therapy: WFL for tasks assessed/performed Overall Cognitive Status: Within Functional Limits for tasks assessed     Blood pressure 128/63, pulse 80, temperature 98.3 F (36.8 C), temperature source Oral,  resp. rate 20, height 6\' 2"  (1.88 m), weight 90.7 kg, SpO2 100 %. Physical Exam  Nursing note and vitals reviewed. Constitutional: He is oriented to person, place, and time. He appears well-developed and well-nourished. No distress.  HENT:  Head: Normocephalic and atraumatic.  Right Ear: External ear normal.  Eyes: Pupils are equal, round, and reactive to light. Conjunctivae are normal.  Neck: Normal range of motion.  Respiratory: Effort normal and breath sounds normal. No stridor. No respiratory distress. He has no wheezes.  GI: Soft. He exhibits no distension. There is no abdominal tenderness.     Musculoskeletal:        General: No deformity. d    Comments: 1+ pedal edema bilaterally  Neurological: He is alert and oriented to person, place, and time. No cranial nerve deficit. Intact sensation in UE. Impaired sensation otherwise below chest. UE motor 4-5/5. LE 0/5 except for perhaps some trace movement in toes. DTR's absent in LE still. No resting tone.  Skin: Skin is warm and dry. No rash noted. He is not diaphoretic. No erythema.  Pressure injury to buttocks and scrotum--blistered area on right buttock and on underside of scrotum.          Lab Results Last 48 Hours  No results found for this or any previous visit (from the past 48 hour(s)).   Imaging Results (  Last 48 hours)  No results found.           Medical Problem List and Plan: 1.  Paraplegia and functional deficits secondary to C7-T1 epidural abscess with associated paraplegia             -patient may not yet shower             -ELOS/Goals: min assist PT/OT 16-20 days 2.  Antithrombotics: -DVT/anticoagulation:  Mechanical: Sequential compression devices, below knee Bilateral lower extremities             -antiplatelet therapy: N/A 3. Pain Management: does not like oxycodone due to SE--will change to ultram prn.  Encouraged patient to take muscle relaxers and Tylenol as needed 4. Mood: LCSW to follow for evaluation  and support             -antipsychotic agents: N/A 5. Neuropsych: This patient is capable of making decisions on his own behalf. 6. Skin/Wound Care: Routine pressure relief measures with local care to sacrum. [place] RN consult as appropriate. 7. Fluids/Electrolytes/Nutrition: Monitor I's/O.  Check lytes in a.m. 8.  Acute blood loss anemia: Recheck CBC in a.m.  9.  PAF: Monitor heart rate 3 times daily.  Continue metoprolol 25 mg twice daily 10.   Neurogenic bladder: We will keep Foley in place for the next 48 to 72 hours until bowel program adjusted 11.  Neurogenic bowel: Reports p.o. intake has been poor due to change in taste---has been drinking a lot of smoothies.  Discussed dietary needs and changes.  Change senna to 2 every morning with suppositories in evening after supper for PM program.  Start today.  Will check KUB to evaluate stool burden as reporting abdominal pain since yesterday. 12. Leucocytosis: Follow up CBC in am.  13.  Hyperglycemia:  Stress  Induced hyperglycemia v/s IPG--will check Hgb A1C in am.          Bary Leriche, PA-C 10/18/2019   The patient's status has not changed from the original H&P.  Any changes in documentation from the acute care chart have been noted above.  Meredith Staggers, MD, [facility]

## 2019-10-19 ENCOUNTER — Inpatient Hospital Stay (HOSPITAL_COMMUNITY): Payer: Medicare Other | Admitting: Physical Therapy

## 2019-10-19 ENCOUNTER — Inpatient Hospital Stay (HOSPITAL_COMMUNITY): Payer: Medicare Other | Admitting: Occupational Therapy

## 2019-10-19 ENCOUNTER — Inpatient Hospital Stay (HOSPITAL_COMMUNITY): Payer: Medicare Other

## 2019-10-19 DIAGNOSIS — G8250 Quadriplegia, unspecified: Secondary | ICD-10-CM

## 2019-10-19 LAB — COMPREHENSIVE METABOLIC PANEL
ALT: 182 U/L — ABNORMAL HIGH (ref 0–44)
AST: 138 U/L — ABNORMAL HIGH (ref 15–41)
Albumin: 1.6 g/dL — ABNORMAL LOW (ref 3.5–5.0)
Alkaline Phosphatase: 159 U/L — ABNORMAL HIGH (ref 38–126)
Anion gap: 12 (ref 5–15)
BUN: 13 mg/dL (ref 8–23)
CO2: 24 mmol/L (ref 22–32)
Calcium: 7.8 mg/dL — ABNORMAL LOW (ref 8.9–10.3)
Chloride: 99 mmol/L (ref 98–111)
Creatinine, Ser: 0.56 mg/dL — ABNORMAL LOW (ref 0.61–1.24)
GFR calc Af Amer: >60 mL/min (ref >60)
GFR calc non Af Amer: >60 mL/min (ref >60)
Glucose, Bld: 102 mg/dL — ABNORMAL HIGH (ref 70–99)
Potassium: 3.7 mmol/L (ref 3.5–5.1)
Sodium: 135 mmol/L (ref 135–145)
Total Bilirubin: 0.4 mg/dL (ref 0.3–1.2)
Total Protein: 5 g/dL — ABNORMAL LOW (ref 6.5–8.1)

## 2019-10-19 LAB — CULTURE, BLOOD (ROUTINE X 2)
Culture: NO GROWTH
Culture: NO GROWTH
Special Requests: ADEQUATE
Special Requests: ADEQUATE

## 2019-10-19 LAB — GLUCOSE, CAPILLARY
Glucose-Capillary: 88 mg/dL (ref 70–99)
Glucose-Capillary: 112 mg/dL — ABNORMAL HIGH (ref 70–99)

## 2019-10-19 LAB — CBC WITH DIFFERENTIAL/PLATELET
Abs Immature Granulocytes: 0.05 10*3/uL (ref 0.00–0.07)
Basophils Absolute: 0 10*3/uL (ref 0.0–0.1)
Basophils Relative: 0 %
Eosinophils Absolute: 0 10*3/uL (ref 0.0–0.5)
Eosinophils Relative: 1 %
HCT: 24.3 % — ABNORMAL LOW (ref 39.0–52.0)
Hemoglobin: 7.8 g/dL — ABNORMAL LOW (ref 13.0–17.0)
Immature Granulocytes: 1 %
Lymphocytes Relative: 13 %
Lymphs Abs: 1.1 10*3/uL (ref 0.7–4.0)
MCH: 29.9 pg (ref 26.0–34.0)
MCHC: 32.1 g/dL (ref 30.0–36.0)
MCV: 93.1 fL (ref 80.0–100.0)
Monocytes Absolute: 0.7 10*3/uL (ref 0.1–1.0)
Monocytes Relative: 8 %
Neutro Abs: 6.8 10*3/uL (ref 1.7–7.7)
Neutrophils Relative %: 77 %
Platelets: 406 10*3/uL — ABNORMAL HIGH (ref 150–400)
RBC: 2.61 MIL/uL — ABNORMAL LOW (ref 4.22–5.81)
RDW: 12.5 % (ref 11.5–15.5)
WBC: 8.7 10*3/uL (ref 4.0–10.5)
nRBC: 0 % (ref 0.0–0.2)

## 2019-10-19 LAB — AEROBIC/ANAEROBIC CULTURE W GRAM STAIN (SURGICAL/DEEP WOUND)

## 2019-10-19 LAB — HEMOGLOBIN A1C
Hgb A1c MFr Bld: 5.6 % (ref 4.8–5.6)
Mean Plasma Glucose: 114.02 mg/dL

## 2019-10-19 MED ORDER — SODIUM CHLORIDE 0.9 % IV SOLN
INTRAVENOUS | Status: DC | PRN
  Administered 2019-10-19 – 2019-11-11 (×5): 250 mL via INTRAVENOUS

## 2019-10-19 MED ORDER — APIXABAN 5 MG PO TABS
5.0000 mg | ORAL_TABLET | Freq: Two times a day (BID) | ORAL | Status: DC
  Administered 2019-10-26 – 2019-11-13 (×36): 5 mg via ORAL
  Filled 2019-10-19 (×36): qty 1

## 2019-10-19 MED ORDER — APIXABAN 5 MG PO TABS: 10.0000 mg | ORAL_TABLET | Freq: Two times a day (BID) | ORAL | Status: DC

## 2019-10-19 MED ORDER — APIXABAN 5 MG PO TABS
10.0000 mg | ORAL_TABLET | Freq: Two times a day (BID) | ORAL | Status: AC
  Administered 2019-10-19 – 2019-10-26 (×14): 10 mg via ORAL
  Filled 2019-10-19 (×14): qty 2

## 2019-10-19 NOTE — Progress Notes (Signed)
VASCULAR LAB PRELIMINARY  PRELIMINARY  PRELIMINARY  PRELIMINARY  Bilateral lower extremity venous duplex completed.    Preliminary report:  See CV proc for preliminary results.  Salvadore Oxford, RN critical results.  Mal Asher, RVT 10/19/2019, 4:36 PM

## 2019-10-19 NOTE — Progress Notes (Signed)
Occupational Therapy Session Note  Patient Details  Name: Ryan Reed MRN: [id number] Date of Birth: 12-25-1950  Today's Date: 10/19/2019 OT Individual Time: 1300-1400 OT Individual Time Calculation (min): 60 min    Short Term Goals: Week 1:  OT Short Term Goal 1 (Week 1): Pt will maintain sitting balance at EOB for 5 minutes with max A of 1 OT Short Term Goal 2 (Week 1): Pt will complete slideboard transfer with max A +2 OT Short Term Goal 3 (Week 1): Pt will complete UB bathing sittting in wc at the sink with min A  Skilled Therapeutic Interventions/Progress Updates:   Pt received in bed with spouse in the room. Pt agreeable to exercises. He began with UE AROM and some resisted exercises.  PROM to LEs then pt had an incontinent episode of bowel. Needed +2 A from nurse tech to help with cleansing pt. Pt did use arms to help roll in bed with min A.  Helped to position legs with rolling.  After clean up, pt worked on more PROM exercises for LEs and some active Assist. Pt did have some muscle tension he could activate in B hip abductors with knee flexed.  Pt resting in bed with spouse in the room.    Therapy Documentation Precautions:  Precautions Precautions: Cervical, Back Restrictions Weight Bearing Restrictions: No     Vital Signs : Therapy Vitals Temp: 99.4 F (37.4 C) Pulse Rate: 78 Resp: 20 BP: (!) 111/54 Patient Position (if appropriate): Lying Oxygen Therapy SpO2: 100 % O2 Device: Room Air Pain: Pain Assessment Pain Score: 0-No pain  Therapy/Group: Individual Therapy  [place] 10/19/2019, 3:18 PM

## 2019-10-19 NOTE — Progress Notes (Signed)
Pt has (+) gastroc DVT- will start Eliquis 10 mg BID x 7 days then 5 mg BID per recommendations.

## 2019-10-19 NOTE — Progress Notes (Signed)
[facility] PHYSICAL MEDICINE & REHABILITATION PROGRESS NOTE   Subjective/Complaints:  Pt reports doing well- slept OK- pain doing OK- waiting for OT_ has been seen by PT already.  ROS- denies SOB, CP, N/V/D, HA.  Objective:   Dg Abd 1 View  Result Date: 10/18/2019 CLINICAL DATA:  Constipation EXAM: ABDOMEN - 1 VIEW COMPARISON:  None. FINDINGS: There is gaseous distention of the colon. The stool burden is average. The bowel gas pattern is nonobstructive. There are no radiopaque kidney stones. IMPRESSION: 1. Gaseous distention of the colon. 2. Average stool burden. Electronically Signed   By: Constance Holster M.D.   On: 10/18/2019 20:53   Recent Labs    10/19/19 0356  WBC 8.7  HGB 7.8*  HCT 24.3*  PLT 406*   Recent Labs    10/19/19 0356  NA 135  K 3.7  CL 99  CO2 24  GLUCOSE 102*  BUN 13  CREATININE 0.56*  CALCIUM 7.8*    Intake/Output Summary (Last 24 hours) at 10/19/2019 1356 Last data filed at 10/19/2019 0837 Gross per 24 hour  Intake 574 ml  Output 901 ml  Net -327 ml     Physical Exam: Vital Signs Blood pressure (!) 109/59, pulse 78, temperature 99.4 F (37.4 C), temperature source Oral, resp. rate 18, SpO2 99 %.  Physical Exam Nursing noteand vitalsreviewed. Constitutional: awake, alert, appropriate, lying supine on bed; NAD  HENT:  Head:Normocephalicand atraumatic.  Right Ear: External earnormal.  Eyes:Pupils are equal, round, and reactive to light.Conjunctivaeare normal.  Neck:Normal range of motion.  Respiratory:Effort normaland breath sounds normal. Nostridor. Norespiratory distress. He hasno wheezes.  [id number]. He exhibitsno distension. There isno abdominal tenderness.   Musculoskeletal:  General: No deformity. d Comments: 1+ pedal edema bilaterally Neurological: He is alertand oriented to person, place, and time. Nocranial nerve deficit. Intact sensation in UE. Impaired sensation otherwise below chest. Biceps  5/5, triceps 5/5, WE 5/5; grip 4/5; finger abd 4/5 B/L. LE 0/5 except for perhaps some trace movement in toes. DTR's absent in LE still. No resting tone.  Skin: Skin iswarmand dry.No rashnoted. He is not diaphoretic. Noerythema. Pressure injury to buttocks and scrotum--blistered area on right buttock and on underside of scrotum.   Assessment/Plan: 1. Functional deficits secondary to C7 quadriplegia/ ASIA B with neurogenic bowel and bladder which require 3+ hours per day of interdisciplinary therapy in a comprehensive inpatient rehab setting.  Physiatrist is providing close team supervision and 24 hour management of active medical problems listed below.  Physiatrist and rehab team continue to assess barriers to discharge/monitor patient progress toward functional and medical goals  Care Tool:  Bathing              Bathing assist       Upper Body Dressing/Undressing Upper body dressing        Upper body assist      Lower Body Dressing/Undressing Lower body dressing      What is the patient wearing?: Incontinence brief     Lower body assist Assist for lower body dressing: Maximal Assistance - Patient 25 - 49%     Toileting Toileting    Toileting assist Assist for toileting: Total Assistance - Patient < 25%     Transfers Chair/bed transfer  Transfers assist           Locomotion Ambulation   Ambulation assist              Walk 10 feet activity   Assist  Walk 50 feet activity   Assist           Walk 150 feet activity   Assist           Walk 10 feet on uneven surface  activity   Assist           Wheelchair     Assist               Wheelchair 50 feet with 2 turns activity    Assist            Wheelchair 150 feet activity     Assist          Blood pressure (!) 109/59, pulse 78, temperature 99.4 F (37.4 C), temperature source Oral, resp. rate 18, SpO2 99 %.  Medical  Problem List and Plan: 1.C7 Quadriplegia ASIA B and functional deficitssecondary to C7-T1 epidural abscess with associated paraplegia -patient maynot yetshower -ELOS/Goals: min assist PT/OT 16-20 days 2. Antithrombotics: -DVT/anticoagulation:Mechanical:Sequential compression devices, below kneeBilateral lower extremities 11/28- will try and get NSU to allow Lovenox due to  high risk of DVT in SCI patients.  -antiplatelet therapy: N/A 3. Pain Management:does not like oxycodone due to SE--will change to ultram prn.Encouragedpatient to take muscle relaxers and Tylenol as needed 4. Mood:LCSW to follow for evaluation and support -antipsychotic agents: N/A 5. Neuropsych: This patientiscapable of making decisions onhisown behalf. 6. Skin/Wound Care:Routine pressure relief measureswith local care to sacrum. [place] RN consult as appropriate. 7. Fluids/Electrolytes/Nutrition:Monitor I's/O. Check lytesin a.m. 8.Acute blood loss anemia: Recheck CBC in a.m.  11/28- Hb 7.8- might cause more problems with orthostatic hypotension- will need abd binder, TEDs, and possibly Midodrine. Will monitor.  9.PAF: Monitor heart rate 3 times daily. Continue metoprolol 25 mg twice daily 10. Neurogenic bladder: We will keep Foley in place for the next 48 to 72 hours until bowel program adjusted 11.Neurogenic bowel: Reports p.o. intake has been poor due to change in taste---has been drinking a lot of smoothies. Discussed dietary needs and changes. Change senna to 2 every morning with suppositories in evening after supper for PM program. Start today. Will check KUB to evaluatestool burdenas reporting abdominal pain since yesterday.  11/28- multiple BMs today- is cleaning self out- needs Bowel program this evening. 12. Leucocytosis in setting of Epidural abscess- on IV ABX x 8 weeks: Follow up CBC in am.  11/28- resolved leukocytosis- will  monitor labs at least weekly- 13. Hyperglycemia: Stress Induced hyperglycemia v/s IPG--will check Hgb A1C in am.        LOS: 1 days A FACE TO FACE EVALUATION WAS PERFORMED  Easter [place] 10/19/2019, 1:56 PM

## 2019-10-19 NOTE — Evaluation (Signed)
Physical Therapy Assessment and Plan  Patient Details  Name: Ryan Reed MRN: [id number] Date of Birth: [birth year]  PT Diagnosis: Abnormal posture, Abnormality of gait, Hypotonia, Paraplegia and Pain in Back Rehab Potential: Fair ELOS: 4 weeks   Today's Date: 10/19/2019 PT Individual Time: 1100-1200 PT Individual Time Calculation (min): 60 min    Problem List:  Patient Active Problem List   Diagnosis Date Noted  . 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 Epidural abscess 10/14/2019  . Paraplegia ([place]) 10/14/2019    Past Medical History: No past medical history on file. Past Surgical History:  Past Surgical History:  Procedure Laterality Date  . LACERATION REPAIR Left    left thigh  . THORACIC LAMINECTOMY FOR EPIDURAL ABSCESS N/A 10/14/2019   Procedure: Cervical seven - THORACIC seven LAMINECTOMIES FOR EPIDURAL ABSCESS;  Surgeon: [name], MD;  Location: [facility];  Service: Neurosurgery;  Laterality: N/A;    Assessment & Plan Clinical Impression: Patient is a 68 year old male in relatively good health except for possibly undiagnosed OSA, normocytic anemia who was developed severe back and chest pain--seen at urgent care on 10/13/19 with recommendations to go to ED but declined. He developed acute onset of paraplegia with sensory loss and was admitted on 10/14/19 for work up. 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 He was taken to the OR emergently for C7-T7 posterior decompressive laminectomy with medial facetectomy and foraminotomy for evacuation of epidural abscess by Dr. Ronnald Ramp. Postop had acute blood loss anemia requiring 4 units of packed red blood cells as well as A. fib requiring beta-blocker for rate control. Wound culture shows abundant Gemella morbilliform and he was started on  Vanco/cefepime initially--this was narrowed to Rocephin 2 g twice daily on11/25.2D echo done showed EF of 60 to 65% with increased LVH--no evidence of endocarditis. Dr. Graylon Good recommended 8 weeks of IV antibiotic therapy and will follow up for further recommendations. Patient with paraplegia and sensory deficits from chest down. Foley in place due to neurogenic bladder   Patient transferred to CIR on 10/18/2019 .   Patient currently requires total with mobility secondary to muscle weakness, muscle joint tightness and muscle paralysis, decreased cardiorespiratoy endurance, abnormal tone and decreased sitting balance, decreased standing balance, decreased postural control and decreased balance strategies.  Prior to hospitalization, patient was independent  with mobility and lived with   in a House home.  Home access is 2Stairs to enter.  Patient will benefit from skilled PT intervention to maximize safe functional mobility, minimize fall risk and decrease caregiver burden for planned discharge home with 24 hour assist.  Anticipate patient will benefit from follow up [facility] at discharge.  PT - End of Session Activity Tolerance: Tolerates < 10 min activity, no significant change in vital signs Endurance Deficit: Yes PT Assessment Rehab Potential (ACUTE/IP ONLY): Fair PT Barriers to Discharge: [place] home environment;Decreased caregiver support;Home environment access/layout;Neurogenic Bowel & Bladder PT Patient demonstrates impairments in the following area(s): Balance;Edema;Endurance;Motor;Pain;Sensory;Skin Integrity PT Transfers Functional Problem(s): Bed Mobility;Bed to Chair;Car;Furniture;Floor PT Locomotion Functional Problem(s): Ambulation;Wheelchair Mobility;Stairs PT Plan PT Intensity: Minimum of 1-2 x/day ,45 to 90 minutes PT Frequency: 5 out of 7 days PT Duration Estimated Length of Stay: 4 weeks PT Treatment/Interventions: Ambulation/gait training;Discharge planning;Functional  mobility training;Psychosocial support;Therapeutic Activities;Visual/perceptual remediation/compensation;Wheelchair propulsion/positioning;Therapeutic Exercise;Skin care/wound management;Neuromuscular re-education;Disease management/prevention;Balance/vestibular training;DME/adaptive equipment instruction;Pain management;Splinting/orthotics;UE/LE Strength  taining/ROM;UE/LE Coordination activities;Stair training;Patient/family education;Functional electrical stimulation;Community reintegration PT Transfers Anticipated Outcome(s): Min assist with LRAD PT Locomotion Anticipated Outcome(s): mod I at [facility] mobility PT Recommendation Recommendations for Other Services: Therapeutic Recreation consult;Neuropsych consult Follow Up Recommendations: Home health PT Patient destination: Home Equipment Recommended: Rolling walker with 5" wheels;Wheelchair (measurements);Wheelchair cushion (measurements);Sliding board;To be determined  Skilled Therapeutic Intervention Pt received supine in bed and agreeable to PT. Supine>sit transfer with total A as listed below. PT instructed patient in PT Evaluation and initiated treatment intervention; see below for results. PT educated patient in [place], rehab potential, rehab goals, and discharge recommendations. Pt unable to boost to standing in stedy due to LE strength and poor stedy placement for bed set up. Reciprocal scooting on bed with max-total A. Pt instructed in sitting balance with BUE supported on deflated bed x 79mn with moderate cues for neutral balance position. Unable to transfer at this time with only +1 assistance. Sit>supine completed with total A and left supine in bed with call bell in reach and all needs met.      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 [employer] 8 chickens, big garden Prior Function Level of Independence: Independent with basic ADLs  Able to Take Stairs?: Yes Driving: Yes Vision/Perception  Perception Perception: Within Functional Limits Praxis Praxis: Intact  Cognition Overall Cognitive Status: Within Functional Limits for tasks assessed Orientation Level: Oriented X4 Safety/Judgment: Appears intact Sensation Sensation Light Touch: Impaired by gross assessment Additional Comments: mild decreased appreciation to light touch in BLE L>R Coordination Gross Motor Movements are Fluid and Coordinated: No Fine Motor Movements are Fluid and Coordinated: No Finger Nose Finger Test: WZambarano Memorial HospitalHeel Shin Test: unable to perform Motor  Motor Motor: Paraplegia Motor - Skilled Clinical Observations: BLE paraplegia  Mobility Bed Mobility Bed Mobility: Rolling Right;Rolling Left;Sit to Supine;Supine to Sit Rolling Right: Maximal Assistance - Patient 25-49% Rolling Left: Maximal Assistance - Patient 25-49% Supine to Sit: Total Assistance - Patient < 25% Sit to Supine: Total Assistance - Patient < 25% Transfers Transfers: (unable to transfer.) Locomotion  Gait Ambulation: No Gait Gait: No Stairs / Additional Locomotion Stairs: No Wheelchair Mobility Wheelchair Mobility: No  Trunk/Postural Assessment  Cervical Assessment Cervical Assessment: Exceptions to WFL(forward head, unable to sustain cervical extension) Thoracic Assessment Thoracic Assessment: Exceptions to WFL(rounded shoulders, unable to sustain trunk extension to neutral for >30 sec.) Lumbar Assessment Lumbar Assessment: Exceptions to WFL(decreased lordosis) Postural  Control Postural Control: Deficits on evaluation(poor righting reactions in all directions)  Balance Balance Balance Assessed: Yes Static Sitting Balance Static Sitting - Balance Support: Bilateral upper extremity supported;Left upper extremity supported;Feet supported Static Sitting - Level of Assistance: 2: Max assist Dynamic Sitting Balance Dynamic Sitting - Balance Support: Bilateral upper extremity supported Dynamic Sitting - Level of Assistance: 2: Max assist;1: +1 Total assist Static Standing Balance Static Standing - Level of Assistance: Not tested (comment) Extremity Assessment      RLE Assessment RLE Assessment: Exceptions to WPres[facility] CenterGeneral Strength Comments: 0/5 proximal to distal LLE Assessment LLE Assessment: Exceptions to WWest Plains Ambulatory Surgery CenterGeneral Strength Comments: 0/5 proximal to distal    Refer to  Care Plan for Long Term Goals  Recommendations for other services: Neuropsych and Therapeutic Recreation  Stress management  Discharge Criteria: Patient will be discharged from PT if patient refuses treatment 3 consecutive times without medical reason, if treatment goals not met, if there is a change in medical status, if patient makes no progress towards goals or if patient is discharged from hospital.  The above assessment, treatment plan, treatment alternatives and goals were discussed and mutually agreed upon: by patient  Lorie Phenix 10/19/2019, 1:07 PM

## 2019-10-20 DIAGNOSIS — G8250 Quadriplegia, unspecified: Secondary | ICD-10-CM | POA: Diagnosis not present

## 2019-10-20 LAB — GLUCOSE, CAPILLARY
Glucose-Capillary: 101 mg/dL — ABNORMAL HIGH (ref 70–99)
Glucose-Capillary: 108 mg/dL — ABNORMAL HIGH (ref 70–99)
Glucose-Capillary: 108 mg/dL — ABNORMAL HIGH (ref 70–99)
Glucose-Capillary: 115 mg/dL — ABNORMAL HIGH (ref 70–99)
Glucose-Capillary: 124 mg/dL — ABNORMAL HIGH (ref 70–99)
Glucose-Capillary: 132 mg/dL — ABNORMAL HIGH (ref 70–99)

## 2019-10-20 NOTE — Progress Notes (Signed)
Assumed care from [place], [place]. Pt is in bed with low air loss mattress. Bed in locked low position and side rails up. Breathing easy, nonlabored, no signs of distress. Will review orders and implement.

## 2019-10-20 NOTE — IPOC Note (Signed)
Overall Plan of Care [facility]) Patient Details Name: Ryan Reed MRN: [id number] DOB: Mar 15, 1951  Admitting Diagnosis: Quadriplegia [facility])  Hospital Problems: Principal Problem:   Quadriplegia ([place]) Active Problems:   Epidural abscess   Gemella infection   Bacterial spinal epidural abscess     Functional Problem List: Nursing Bladder, Bowel, Edema, Endurance, Pain, Medication Management, Safety, Skin Integrity  PT Balance, Edema, Endurance, Motor, Pain, Sensory, Skin Integrity  OT Balance, Endurance, Motor, Sensory  SLP    TR         Basic ADL's: OT Grooming, Bathing, Dressing, Toileting     Advanced  ADL's: OT       Transfers: PT Bed Mobility, Bed to Chair, Car, Furniture, Futures trader, Metallurgist: PT Ambulation, Emergency planning/management officer, Stairs     Additional Impairments: OT Fuctional Use of Upper Extremity  SLP        TR      Anticipated Outcomes Item Anticipated Outcome  Self Feeding Mod I  Swallowing      Basic self-care  Min A  Toileting  Min A   Bathroom Transfers Min A toilet, Mod  A tub/shower  Bowel/Bladder  manage with min assist  Transfers  Min assist with LRAD  Locomotion  mod I at [facility] mobility  Communication     Cognition     Pain  Pain at or below level 4  Safety/Judgment  maintain safety with cues/reminders   Therapy Plan: PT Intensity: Minimum of 1-2 x/day ,45 to 90 minutes PT Frequency: 5 out of 7 days PT Duration Estimated Length of Stay: 4 weeks OT Intensity: Minimum of 1-2 x/day, 45 to 90 minutes OT Frequency: 5 out of 7 days OT Duration/Estimated Length of Stay: 4 weeks     Due to the current state of emergency, patients may not be receiving their 3-hours of Medicare-mandated therapy.   Team Interventions: Nursing Interventions Patient/Family Education, Bowel Management, Pain Management, Skin Care/Wound Management, Bladder Management, Disease Management/Prevention, Medication Management, Discharge  Planning  PT interventions Ambulation/gait training, Discharge planning, Functional mobility training, Psychosocial support, Therapeutic Activities, Visual/perceptual remediation/compensation, Wheelchair propulsion/positioning, Therapeutic Exercise, Skin care/wound management, Neuromuscular re-education, Disease management/prevention, Training and development officer, DME/adaptive equipment instruction, Pain management, Splinting/orthotics, UE/LE Strength taining/ROM, UE/LE Coordination activities, Stair training, Patient/family education, Functional electrical stimulation, Community reintegration  OT Interventions Training and development officer, Academic librarian, Discharge planning, Disease mangement/prevention, Engineer, drilling, Functional electrical stimulation, Functional mobility training, Pain management, Neuromuscular re-education, Patient/family education, Psychosocial support, Self Care/advanced ADL retraining, Skin care/wound managment, Splinting/orthotics, Therapeutic Activities, Therapeutic Exercise, UE/LE Strength taining/ROM, UE/LE Coordination activities, Wheelchair propulsion/positioning  SLP Interventions    TR Interventions    SW/CM Interventions     Barriers to Discharge MD  Medical stability, Home enviroment access/loayout, Incontinence, Neurogenic bowel and bladder, Wound care, Lack of/limited family support, Weight, Medication compliance, Behavior and new C7 quedriplegia  Nursing      PT Inaccessible home environment, Decreased caregiver support, Home environment access/layout, Neurogenic Bowel & Bladder    OT      SLP      SW       Team Discharge Planning: Destination: PT-Home ,OT- Home , SLP-  Projected Follow-up: PT-Home health PT, OT-  Home health OT, SLP-  Projected Equipment Needs: PT-Rolling walker with 5" wheels, Wheelchair (measurements), Wheelchair cushion (measurements), Sliding board, To be determined, OT- To be determined, SLP-  Equipment  Details: PT- , OT-  Patient/family involved in discharge planning: PT- Patient,  OT-Patient, SLP-  MD ELOS: 4 weeks Medical Rehab Prognosis:  Good Assessment: Pt is a 68 yr old male with epidural abscess due to dental with new C7 ASIA B quadriplegia, neurogenic bowel and bladder, as well as on IV ABX x 8 weeks due epidural abscess- pt also just started bowel program, working on it and will remove Foley on Monday.   Goals min assist in 4 weeks due to C7 tetraplegia.    See Team Conference Notes for weekly updates to the plan of care

## 2019-10-20 NOTE — Progress Notes (Signed)
Bowel program and dig stim performed after supper as ordered. Patient had large BM. Will continue to monitor

## 2019-10-20 NOTE — Progress Notes (Signed)
[facility] PHYSICAL MEDICINE & REHABILITATION PROGRESS NOTE   Subjective/Complaints:  Pt reports low energy today- also, was dx'd with gastroc DVT yesterday and I started Apixiban-  Pt also reports had large BM with bowel program last night and also had BM ~ 4am as well when woke up.  So still getting cleaned out.   ROS- denies SOB, CP, N/V/D, HA.  Objective:   Dg Abd 1 View  Result Date: 10/18/2019 CLINICAL DATA:  Constipation EXAM: ABDOMEN - 1 VIEW COMPARISON:  None. FINDINGS: There is gaseous distention of the colon. The stool burden is average. The bowel gas pattern is nonobstructive. There are no radiopaque kidney stones. IMPRESSION: 1. Gaseous distention of the colon. 2. Average stool burden. Electronically Signed   By: Constance Holster M.D.   On: 10/18/2019 20:53   Vas [place] Lower Extremity Venous (dvt)  Result Date: 10/20/2019  Lower Venous Study Indications: Paraplegia.  Comparison Study: No prior study on file for comparison Performing Technologist: Sharion Dove RVS  Examination Guidelines: A complete evaluation includes B-mode imaging, spectral Doppler, color Doppler, and power Doppler as needed of all accessible portions of each vessel. Bilateral testing is considered an integral part of a complete examination. Limited examinations for reoccurring indications may be performed as noted.  +---------+---------------+---------+-----------+----------+--------------+ RIGHT    CompressibilityPhasicitySpontaneityPropertiesThrombus Aging +---------+---------------+---------+-----------+----------+--------------+ CFV      Full           Yes      Yes                                 +---------+---------------+---------+-----------+----------+--------------+ SFJ      Full                                                        +---------+---------------+---------+-----------+----------+--------------+ FV Prox  Full                                                         +---------+---------------+---------+-----------+----------+--------------+ FV Mid   Full                                                        +---------+---------------+---------+-----------+----------+--------------+ FV DistalFull                                                        +---------+---------------+---------+-----------+----------+--------------+ PFV      Full                                                        +---------+---------------+---------+-----------+----------+--------------+ POP      Full  Yes      Yes                                 +---------+---------------+---------+-----------+----------+--------------+ PTV      Full                                                        +---------+---------------+---------+-----------+----------+--------------+ PERO     Full                                                        +---------+---------------+---------+-----------+----------+--------------+ Soleal   Full                                                        +---------+---------------+---------+-----------+----------+--------------+ Gastroc  None                                         Acute          +---------+---------------+---------+-----------+----------+--------------+   +---------+---------------+---------+-----------+----------+--------------+ LEFT     CompressibilityPhasicitySpontaneityPropertiesThrombus Aging +---------+---------------+---------+-----------+----------+--------------+ CFV      Full                                                        +---------+---------------+---------+-----------+----------+--------------+ SFJ      Full                                                        +---------+---------------+---------+-----------+----------+--------------+ FV Prox  Full                                                         +---------+---------------+---------+-----------+----------+--------------+ FV Mid   Full                                                        +---------+---------------+---------+-----------+----------+--------------+ FV DistalFull                                                        +---------+---------------+---------+-----------+----------+--------------+ PFV  Full                                                        +---------+---------------+---------+-----------+----------+--------------+ POP      Full                                                        +---------+---------------+---------+-----------+----------+--------------+ PTV      Full                                                        +---------+---------------+---------+-----------+----------+--------------+ PERO     None                                         Acute          +---------+---------------+---------+-----------+----------+--------------+ Gastroc  None                                         Acute          +---------+---------------+---------+-----------+----------+--------------+     Summary: Right: Findings consistent with acute deep vein thrombosis involving the right gastrocnemius veins. Left: Findings consistent with acute deep vein thrombosis involving the left gastrocnemius veins, and left peroneal veins.  *See table(s) above for measurements and observations. Electronically signed by Curt Jews MD on 10/20/2019 at 7:48:11 AM.    Final    Recent Labs    10/19/19 0356  WBC 8.7  HGB 7.8*  HCT 24.3*  PLT 406*   Recent Labs    10/19/19 0356  NA 135  K 3.7  CL 99  CO2 24  GLUCOSE 102*  BUN 13  CREATININE 0.56*  CALCIUM 7.8*    Intake/Output Summary (Last 24 hours) at 10/20/2019 1249 Last data filed at 10/20/2019 0700 Gross per 24 hour  Intake 531.88 ml  Output 2151 ml  Net -1619.12 ml     Physical Exam: Vital Signs Blood pressure 117/63,  pulse 72, temperature 99.5 F (37.5 C), temperature source Oral, resp. rate 18, SpO2 100 %.  Physical Exam Nursing noteand vitalsreviewed. Constitutional: awake, alert, appropriate, lying supine on bed- lower energy today than normally;; NAD  HENT:  Head:Normocephalicand atraumatic.  Right Ear: External earnormal.  Eyes:conjugate gaze Neck:Normal range of motion. CV: RRR  Respiratory:CTA B/L  NX:[id number]. He exhibitsno distension. There isno abdominal tenderness.   Musculoskeletal:  General: No deformity. d Comments: 1+ pedal edema bilaterally Neurological: He is alertand oriented to person, place, and time. Nocranial nerve deficit. Intact sensation in UE. Impaired sensation otherwise below chest. Biceps 5/5, triceps 5/5, WE 5/5; grip 4/5; finger abd 4/5 B/L. LE 0/5 except for perhaps some trace movement in toes. DTR's absent in LE still. No resting tone.  Skin: Skin iswarmand dry.No rashnoted. He is not diaphoretic. Noerythema. Pressure injury to buttocks and  scrotum--blistered area on right buttock and on underside of scrotum.   Assessment/Plan: 1. Functional deficits secondary to C7 quadriplegia/ ASIA B with neurogenic bowel and bladder which require 3+ hours per day of interdisciplinary therapy in a comprehensive inpatient rehab setting.  Physiatrist is providing close team supervision and 24 hour management of active medical problems listed below.  Physiatrist and rehab team continue to assess barriers to discharge/monitor patient progress toward functional and medical goals  Care Tool:  Bathing    Body parts bathed by patient: Right arm, Left arm, Chest, Abdomen, Front perineal area, Face   Body parts bathed by helper: Buttocks, Right upper leg, Left upper leg, Right lower leg, Left lower leg     Bathing assist Assist Level: 2 Helpers     Upper Body Dressing/Undressing Upper body dressing   What is the patient wearing?: Hospital gown only     Upper body assist Assist Level: Maximal Assistance - Patient 25 - 49%    Lower Body Dressing/Undressing Lower body dressing      What is the patient wearing?: Incontinence brief     Lower body assist Assist for lower body dressing: 2 Helpers     Toileting Toileting Toileting Activity did not occur (Clothing management and hygiene only): N/A (no void or bm)  Toileting assist Assist for toileting: Moderate Assistance - Patient 50 - 74%     Transfers Chair/bed transfer  Transfers assist  Chair/bed transfer activity did not occur: Safety/medical concerns        Locomotion Ambulation   Ambulation assist   Ambulation activity did not occur: Safety/medical concerns          Walk 10 feet activity   Assist  Walk 10 feet activity did not occur: Safety/medical concerns        Walk 50 feet activity   Assist Walk 50 feet with 2 turns activity did not occur: Safety/medical concerns         Walk 150 feet activity   Assist Walk 150 feet activity did not occur: Safety/medical concerns         Walk 10 feet on uneven surface  activity   Assist Walk 10 feet on uneven surfaces activity did not occur: Safety/medical concerns         Wheelchair     Assist Will patient use wheelchair at discharge?: Yes Type of Wheelchair: Manual Wheelchair activity did not occur: Safety/medical concerns         Wheelchair 50 feet with 2 turns activity    Assist    Wheelchair 50 feet with 2 turns activity did not occur: Safety/medical concerns       Wheelchair 150 feet activity     Assist  Wheelchair 150 feet activity did not occur: Safety/medical concerns       Blood pressure 117/63, pulse 72, temperature 99.5 F (37.5 C), temperature source Oral, resp. rate 18, SpO2 100 %.  Medical Problem List and Plan: 1.C7 Quadriplegia ASIA B and functional deficitssecondary to C7-T1 epidural abscess with associated paraplegia -patient  maynot yetshower -ELOS/Goals: min assist PT/OT 16-20 days 2. Antithrombotics: -DVT/anticoagulation:Mechanical:Sequential compression devices, below kneeBilateral lower extremities 11/28- will try and get NSU to allow Lovenox due to  high risk of DVT in SCI patients.  11/29- has B/L gastroc DVTs- started Apixiban 10 mg BID x 1 week then 5 mg BID for DVTs.  -antiplatelet therapy: N/A 3. Pain Management:does not like oxycodone due to SE--will change to ultram prn.Encouragedpatient to take muscle relaxers and Tylenol  as needed 4. Mood:LCSW to follow for evaluation and support -antipsychotic agents: N/A 5. Neuropsych: This patientiscapable of making decisions onhisown behalf. 6. Skin/Wound Care:Routine pressure relief measureswith local care to sacrum. [place] RN consult as appropriate. 7. Fluids/Electrolytes/Nutrition:Monitor I's/O. Check lytesin a.m. 8.Acute blood loss anemia: Recheck CBC in a.m.  11/28- Hb 7.8- might cause more problems with orthostatic hypotension- will need abd binder, TEDs, and possibly Midodrine. Will monitor.   11/29- no TEDs for now. No SCDs 9.PAF: Monitor heart rate 3 times daily. Continue metoprolol 25 mg twice daily 10. Neurogenic bladder: We will keep Foley in place for the next 48 to 72 hours until bowel program adjusted 11.Neurogenic bowel: Reports p.o. intake has been poor due to change in taste---has been drinking a lot of smoothies. Discussed dietary needs and changes. Change senna to 2 every morning with suppositories in evening after supper for PM program. Start today. Will check KUB to evaluatestool burdenas reporting abdominal pain since yesterday.  11/28- multiple BMs today- is cleaning self out- needs Bowel program this evening.  11/29- 2 BMs last night/Overnight-  12. Leucocytosis in setting of Epidural abscess- on IV ABX x 8 weeks: Follow up CBC in am.  11/28- resolved leukocytosis- will  monitor labs at least weekly- 13. Hyperglycemia: Stress Induced hyperglycemia v/s IPG--will check Hgb A1C in am.  11/29- A1c 5.6         LOS: 2 days A FACE TO FACE EVALUATION WAS PERFORMED  Ryan Reed 10/20/2019, 12:49 PM

## 2019-10-21 ENCOUNTER — Inpatient Hospital Stay (HOSPITAL_COMMUNITY): Payer: Medicare Other | Admitting: Occupational Therapy

## 2019-10-21 ENCOUNTER — Inpatient Hospital Stay (HOSPITAL_COMMUNITY): Payer: Medicare Other | Admitting: Physical Therapy

## 2019-10-21 LAB — CBC WITH DIFFERENTIAL/PLATELET
Abs Immature Granulocytes: 0.06 10*3/uL (ref 0.00–0.07)
Basophils Absolute: 0 10*3/uL (ref 0.0–0.1)
Basophils Relative: 0 %
Eosinophils Absolute: 0.1 10*3/uL (ref 0.0–0.5)
Eosinophils Relative: 1 %
HCT: 24.3 % — ABNORMAL LOW (ref 39.0–52.0)
Hemoglobin: 7.9 g/dL — ABNORMAL LOW (ref 13.0–17.0)
Immature Granulocytes: 1 %
Lymphocytes Relative: 13 %
Lymphs Abs: 1.1 10*3/uL (ref 0.7–4.0)
MCH: 30.3 pg (ref 26.0–34.0)
MCHC: 32.5 g/dL (ref 30.0–36.0)
MCV: 93.1 fL (ref 80.0–100.0)
Monocytes Absolute: 0.5 10*3/uL (ref 0.1–1.0)
Monocytes Relative: 6 %
Neutro Abs: 6.8 10*3/uL (ref 1.7–7.7)
Neutrophils Relative %: 79 %
Platelets: 412 10*3/uL — ABNORMAL HIGH (ref 150–400)
RBC: 2.61 MIL/uL — ABNORMAL LOW (ref 4.22–5.81)
RDW: 12.4 % (ref 11.5–15.5)
WBC: 8.6 10*3/uL (ref 4.0–10.5)
nRBC: 0 % (ref 0.0–0.2)

## 2019-10-21 LAB — BASIC METABOLIC PANEL
Anion gap: 7 (ref 5–15)
BUN: 11 mg/dL (ref 8–23)
CO2: 25 mmol/L (ref 22–32)
Calcium: 7.8 mg/dL — ABNORMAL LOW (ref 8.9–10.3)
Chloride: 101 mmol/L (ref 98–111)
Creatinine, Ser: 0.52 mg/dL — ABNORMAL LOW (ref 0.61–1.24)
GFR calc Af Amer: >60 mL/min (ref >60)
GFR calc non Af Amer: >60 mL/min (ref >60)
Glucose, Bld: 139 mg/dL — ABNORMAL HIGH (ref 70–99)
Potassium: 3.6 mmol/L (ref 3.5–5.1)
Sodium: 133 mmol/L — ABNORMAL LOW (ref 135–145)

## 2019-10-21 LAB — C-REACTIVE PROTEIN: CRP: 11.6 mg/dL — ABNORMAL HIGH (ref <1.0)

## 2019-10-21 LAB — SEDIMENTATION RATE: Sed Rate: 82 mm/hr — ABNORMAL HIGH (ref 0–16)

## 2019-10-21 LAB — GLUCOSE, CAPILLARY: Glucose-Capillary: 117 mg/dL — ABNORMAL HIGH (ref 70–99)

## 2019-10-21 NOTE — Progress Notes (Signed)
Inpatient Rehabilitation  Patient information reviewed and entered into eRehab system by Sabrina Arriaga M. Chizaram Latino, M.A., CCC/SLP, PPS Coordinator.  Information including medical coding, functional ability and quality indicators will be reviewed and updated through discharge.    

## 2019-10-21 NOTE — Progress Notes (Signed)
[facility] for Infectious Disease  Date of Admission:  10/18/2019      Total days of antibiotics 8   Day 6 BID Ceftriaxone           ASSESSMENT: Ryan Reed is a 68 y.o. male with C7-T7 epidural abscess due to Gemella POD7 following decompression by Dr. Ronnald Ramp. PICC line is in place and patient currently in rehab undergoing therapy. No standard testing available for susceptibilities with this organism but high degree of sensitivity to penicillins and ceftraixone; will plan on continuing Ceftraixone BID to facilitate rehabilitation (no continuous IV hook up) x 8 weeks. TTE with normal EF 65%, no structural defects of valves and no evidence of large vegetations or surrogate findings that would be suspicious for endocarditis. Will discuss with Dr. Baxter Flattery to forego TEE in hopes to prevent further vocal cord dysfunction as he already has an indication for prolonged antibiotic treatment.  Continue safety labs and will have him see ID team about 6 weeks post op considering he will require Inpatient Rehab stay of ~4-5 weeks.   Fevers - temperatures 100.5 x 2 recently. ?related to DVT. No new findings that indicate alternate bacterial infection at this time. WBC normalized. Back pain is not worsened. No skin breakdown and only medical device in place is PICC line. Would continue to follow   Would have him follow up with dentist outpatient for routine cleaning and if any further acute needs arise considering his likely primary source of infection was odontogenic.   DVT - on apixaban for treatment per primary team.    PLAN: 1. OPAT as detailed below.  2. Please call ID team back should his fevers persist or condition change   OPAT ORDERS:  Diagnosis: C7-T7 Epidural Abscess with cord compression   Culture Result: Gemella morbillorum   No Known Allergies  Discharge antibiotics: Ceftriaxone 2 gm IV BID    Duration: 8 weeks   End Date: January 18th 2021  [facility] Care  and Maintenance Per Protocol _x_ Please pull PIC at completion of IV antibiotics __ Please leave PIC in place until doctor has seen patient or been notified  Labs weekly while on IV antibiotics: _x_ CBC with differential _x_ CMP _x_ CRP _x_ ESR   Fax weekly labs to [phone]  Clinic Follow Up Appt:  January 5th  @ 2:30 pm with Dr. Baxter Flattery - can arrange for MyChart Video Visit for safety.    Principal Problem:   Quadriplegia ([place]) Active Problems:   Epidural abscess   Gemella infection   Bacterial spinal epidural abscess   . apixaban  10 mg Oral BID   Followed by  . [START ON 10/26/2019] apixaban  5 mg Oral BID  . bisacodyl  10 mg Rectal QPC supper  . Chlorhexidine Gluconate Cloth  6 each Topical Daily  . feeding supplement (PRO-STAT SUGAR FREE 64)  30 mL Oral BID  . influenza vaccine adjuvanted  0.5 mL Intramuscular Tomorrow-1000  . metoprolol tartrate  25 mg Oral BID  . multivitamin with minerals  1 tablet Oral Daily  . senna  2 tablet Oral Daily  . sodium chloride flush  10-40 mL Intracatheter Q12H    SUBJECTIVE: Doing well. Wife is at the bedside. Estimates that he will be in [facility] for 4-5 weeks.  Having some low grade fevers. No new complaints / concerns otherwise.   Review of Systems: Review of Systems  Constitutional: Positive for fever. Negative for chills,  malaise/fatigue and weight loss.  HENT: Negative for sore throat.   Respiratory: Negative for cough and sputum production.   Cardiovascular: Negative for chest pain and leg swelling.  Gastrointestinal: Negative for abdominal pain, diarrhea and vomiting.  Genitourinary: Negative for dysuria and flank pain.  Musculoskeletal: Negative for joint pain, myalgias and neck pain.  Skin: Negative for rash.  Neurological: Negative for dizziness, tingling and headaches.  Psychiatric/Behavioral: Negative for depression and substance abuse. The patient is not nervous/anxious and does not have insomnia.     No  Known Allergies  OBJECTIVE: Vitals:   10/20/19 1500 10/20/19 1917 10/21/19 0534 10/21/19 0935  BP: (!) 107/54 (!) 102/58 108/61 105/63  Pulse: 73 73 72 67  Resp: _0 Temp: (!) 100.5 F (38.1 C) 98.3 F (36.8 C) 99.2 F (37.3 C)   TempSrc:   Oral   SpO2: 99% 100% 99% 100%   There is no height or weight on file to calculate BMI.  Physical Exam HENT:     Mouth/Throat:     Mouth: No oral lesions.     Dentition: Normal dentition. No dental caries.  Eyes:     General: No scleral icterus. Cardiovascular:     Rate and Rhythm: Normal rate and regular rhythm.     Heart sounds: Normal heart sounds.  Pulmonary:     Effort: Pulmonary effort is normal.     Breath sounds: Normal breath sounds.  Abdominal:     General: There is no distension.     Palpations: Abdomen is soft.     Tenderness: There is no abdominal tenderness.  Lymphadenopathy:     Cervical: No cervical adenopathy.  Skin:    General: Skin is warm and dry.     Capillary Refill: Capillary refill takes less than 2 seconds.     Findings: No rash.  Neurological:     Mental Status: He is alert and oriented to person, place, and time.     Sensory: Sensation is intact.     Motor: Weakness present.     Lab Results Lab Results  Component Value Date   WBC 8.6 10/21/2019   HGB 7.9 (L) 10/21/2019   HCT 24.3 (L) 10/21/2019   MCV 93.1 10/21/2019   PLT 412 (H) 10/21/2019    Lab Results  Component Value Date   CREATININE 0.52 (L) 10/21/2019   BUN 11 10/21/2019   NA 133 (L) 10/21/2019   K 3.6 10/21/2019   CL 101 10/21/2019   CO2 25 10/21/2019    Lab Results  Component Value Date   ALT 182 (H) 10/19/2019   AST 138 (H) 10/19/2019   ALKPHOS 159 (H) 10/19/2019   BILITOT 0.4 10/19/2019     Microbiology: Recent Results (from the past 240 hour(s))  Urine culture     Status: None   Collection Time: 10/14/19 10:20 AM   Specimen: In/Out Cath Urine  Result Value Ref Range Status   Specimen Description IN/OUT  CATH URINE  Final   Special Requests NONE  Final   Culture   Final    NO GROWTH Performed at [facility], [address], [place], [place] [address]    Report Status 10/15/2019 FINAL  Final  SARS CORONAVIRUS 2 (TAT 6-24 HRS) Nasopharyngeal Nasopharyngeal Swab     Status: None   Collection Time: 10/14/19 11:04 AM   Specimen: Nasopharyngeal Swab  Result Value Ref Range Status   SARS Coronavirus 2 NEGATIVE NEGATIVE Final    Comment: (  NOTE) SARS-CoV-2 target nucleic acids are NOT DETECTED. The SARS-CoV-2 RNA is generally detectable in upper and lower respiratory specimens during the acute phase of infection. Negative results do not preclude SARS-CoV-2 infection, do not rule out co-infections with other pathogens, and should not be used as the sole basis for treatment or other patient management decisions. Negative results must be combined with clinical observations, patient history, and epidemiological information. The expected result is Negative. Fact Sheet for Patients: SugarRoll.be Fact Sheet for Healthcare Providers: https://www.woods-mathews.com/ This test is not yet approved or cleared by the [place] FDA and  has been authorized for detection and/or diagnosis of SARS-CoV-2 by FDA under an Emergency Use Authorization (EUA). This EUA will remain  in effect (meaning this test can be used) for the duration of the COVID-19 declaration under Section 56 4(b)(1) of the Act, 21 U.S.C. section 360bbb-3(b)(1), unless the authorization is terminated or revoked sooner. Performed at [facility], [place] [address] [place], [place] [address]   Blood culture (routine x 2)     Status: None   Collection Time: 10/14/19  2:06 PM   Specimen: BLOOD  Result Value Ref Range Status   Specimen Description BLOOD RIGHT ANTECUBITAL  Final   Special Requests   Final    BOTTLES DRAWN AEROBIC AND ANAEROBIC Blood Culture adequate volume   Culture    Final    NO GROWTH 5 DAYS Performed at [facility], [place] [address] [place], [place] [address]    Report Status 10/19/2019 FINAL  Final  Blood culture (routine x 2)     Status: None   Collection Time: 10/14/19  2:10 PM   Specimen: BLOOD RIGHT WRIST  Result Value Ref Range Status   Specimen Description BLOOD RIGHT WRIST  Final   Special Requests   Final    BOTTLES DRAWN AEROBIC AND ANAEROBIC Blood Culture adequate volume   Culture   Final    NO GROWTH 5 DAYS Performed at [facility], [place] [address] [place], [place] [address]    Report Status 10/19/2019 FINAL  Final  Aerobic/Anaerobic Culture (surgical/deep wound)     Status: None   Collection Time: 10/14/19  8:19 PM   Specimen: Abscess  Result Value Ref Range Status   Specimen Description ABSCESS  Final   Special Requests CERVICO THORAIC EPIDURAL PT ON VANC  Final   Gram Stain   Final    FEW WBC PRESENT, PREDOMINANTLY PMN MODERATE GRAM POSITIVE COCCI    Culture   Final    ABUNDANT GEMELLA MORBILLORUM Standardized susceptibility testing for this organism is not available. NO ANAEROBES ISOLATED Performed at [facility], [place] [address] [place], [place] [address]    Report Status 10/19/2019 FINAL  Final  MRSA PCR Screening     Status: None   Collection Time: 10/15/19 12:58 AM   Specimen: Nasopharyngeal  Result Value Ref Range Status   MRSA by PCR NEGATIVE NEGATIVE Final    Comment:        The GeneXpert MRSA Assay (FDA approved for NASAL specimens only), is one component of a comprehensive MRSA colonization surveillance program. It is not intended to diagnose MRSA infection nor to guide or monitor treatment for MRSA infections. Performed at [facility], [place] [address], [place], [place] [address]      Janene Madeira, MSN, NP-C [facility] [email or web address] Pager: [phone] Office: [phone] [place]:  [phone]

## 2019-10-21 NOTE — Progress Notes (Signed)
PHARMACY CONSULT NOTE FOR:  OUTPATIENT  PARENTERAL ANTIBIOTIC THERAPY (OPAT)  Indication:  C7-T7 epidural abscess with cord compression Regimen: Ceftriaxone 2 gm IV Q 12 hours  End date: 12/09/2019  IV antibiotic discharge orders are pended. To discharging provider:  please sign these orders via discharge navigator,  Select New Orders & click on the button choice - Manage This Unsigned Work.     Thank you for allowing pharmacy to be a part of this patient's care.  Jimmy Footman, PharmD, BCPS, [facility] Clinical Pharmacist Phone: [phone] 10/21/2019, 2:29 PM

## 2019-10-21 NOTE — Progress Notes (Signed)
Physical Therapy Session Note  Patient Details  Name: Ryan Reed MRN: [id number] Date of Birth: [birth year]  Today's Date: 10/21/2019 PT Individual Time: 1420-1530 PT Individual Time Calculation (min): 70 min   Short Term Goals: Week 1:  PT Short Term Goal 1 (Week 1): Pt will transfer to and from [facility] with max assist +2 PT Short Term Goal 2 (Week 1): Pt will propell WC x 165ft with min assist PT Short Term Goal 3 (Week 1): Pt will sit EOB with mod assist up to 5 min  Skilled Therapeutic Interventions/Progress Updates:    Pt received supine in bed, agreeable to PT session. No complaints of pain. Rolling L/R with min A and skilled cueing for use of bedrail for dependent placement of maxi sky sling. Supine BP 117/61. Maxi sky transfer bed to w/c. Seated BP 109/74 with use of TEDs. Pt has no instances of dizziness or feeling lightheaded during session. Dependent transport via w/c to therapy gym for time conservation. Maxi sky transfer w/c to mat table. Session focus on sitting balance EOM. Pt requires mod to max A overall to maintain sitting balance progressing to min A during session. Focus on leaning anteriorly with use of head to initiate movement. Pt requires min to mod A to lean anteriorly with mod to max A to maintain upright sitting. Pt reports having some lateral control over trunk movements but little to no anterior/posterior control. Pt relies on BUE support on mat table to maintain sitting balance, is able to place hands in lap and maintain sitting balance with min to mod A overall. [place] transfer back to w/c then back to bed. Rolling L/R with min A for sling removal. Pt left supine in bed with needs in reach, wife present at end of session.  Therapy Documentation Precautions:  Precautions Precautions: Cervical, Back Restrictions Weight Bearing Restrictions: No    Therapy/Group: Individual Therapy   Excell Seltzer, PT, DPT  10/21/2019, 3:35 PM

## 2019-10-21 NOTE — Progress Notes (Signed)
Occupational Therapy Session Note  Patient Details  Name: Ryan Reed MRN: [id number] Date of Birth: Sep 14, 1951  Today's Date: 10/21/2019 OT Individual Time: [id number] OT Individual Time Calculation (min): 45 min   Short Term Goals: Week 1:  OT Short Term Goal 1 (Week 1): Pt will maintain sitting balance at EOB for 5 minutes with max A of 1 OT Short Term Goal 2 (Week 1): Pt will complete slideboard transfer with max A +2 OT Short Term Goal 3 (Week 1): Pt will complete UB bathing sittting in wc at the sink with min A  Skilled Therapeutic Interventions/Progress Updates:    Pt greeted in bed, reporting a little back pain but that it was manageable enough for tx without medicinal interventions. Started with checking his brief with pt having a small incontinent BM. 2 assist for logrolling Lt>Rt for hygiene and brief change. Pt able to assist with his UB but needed Total A for LE mgt. Then instructed pt on reacher use for threading LEs into his pants with HOB elevated and each LE positioned in modified figure 4. Pt able to pull pants up from knees to thighs while adhering to back precautions. Rolling Rt>Lt completed with Mod A for LE mgt. Pt able to pull his pants up 90% of the way with assist for maintaining sidelying position. +2 for supine<sit with bed deflated using logroll technique for precaution adherence. Worked on sitting balance EOB while completing UB self care. Note pt initially with heavy B UE reliance on bedrails for trunk control. Worked on pt removing 1 or both UEs from bed supports while applying deoderent and donning overhead shirt. 2 assist present for safety, positioned in front and behind pt, Mod A of 2 for balance overall. Pt with 1 forward LOB needing Max A to recover. He then completed small weight shifts with bilateral hand supports and steady assist of 2 to work on trunk control as well. +2 for returning to bed. He was able to fully boost himself up with bed placed in  trendelenburg position with use of headboard. Pt was repositioned for comfort and left with all needs within reach. Tx focus placed on trunk control, adaptive self care skills, and precaution adherence during functional activity.   Therapy Documentation Precautions:  Precautions Precautions: Cervical, Back Restrictions Weight Bearing Restrictions: No Vital Signs: Therapy Vitals Pulse Rate: 67 BP: 105/63 Oxygen Therapy SpO2: 100 % Pain: Pain Assessment Pain Scale: 0-10 Pain Score: 0-No pain ADL: ADL Eating: Set up Grooming: Setup Upper Body Bathing: Minimal assistance, Moderate assistance Lower Body Bathing: Maximal assistance, Dependent Upper Body Dressing: Minimal assistance, Moderate assistance Lower Body Dressing: Dependent Toileting: Dependent Toilet Transfer: Unable to assess Tub/Shower Transfer: Unable to assess      Therapy/Group: Individual Therapy  Sahmya Arai A Meng [place] 10/21/2019, 12:36 PM

## 2019-10-21 NOTE — Plan of Care (Signed)
  Problem: Education: Goal: Ability to verbalize activity precautions or restrictions will improve Outcome: Progressing Goal: Knowledge of the prescribed therapeutic regimen will improve Outcome: Progressing Goal: Understanding of discharge needs will improve Outcome: Progressing   Problem: Activity: Goal: Ability to avoid complications of mobility impairment will improve Outcome: Progressing Goal: Ability to tolerate increased activity will improve Outcome: Progressing Goal: Will remain free from falls Outcome: Progressing   Problem: Bowel/Gastric: Goal: Gastrointestinal status for postoperative course will improve Outcome: Progressing   Problem: Clinical Measurements: Goal: Ability to maintain clinical measurements within normal limits will improve Outcome: Progressing Goal: Postoperative complications will be avoided or minimized Outcome: Progressing Goal: Diagnostic test results will improve Outcome: Progressing   Problem: Pain Management: Goal: Pain level will decrease Outcome: Progressing   Problem: Skin Integrity: Goal: Will show signs of wound healing Outcome: Progressing   Problem: Health Behavior/Discharge Planning: Goal: Identification of resources available to assist in meeting health care needs will improve Outcome: Progressing   Problem: Bladder/Genitourinary: Goal: Urinary functional status for postoperative course will improve Outcome: Progressing   Problem: Consults Goal: RH SPINAL CORD INJURY PATIENT EDUCATION Description:  See Patient Education module for education specifics.  Outcome: Progressing   Problem: Consults Goal: RH SPINAL CORD INJURY PATIENT EDUCATION Description:  See Patient Education module for education specifics.  Outcome: Progressing   Problem: SCI BOWEL ELIMINATION Goal: RH STG SCI MANAGE BOWEL PROGRAM W/ASSIST OR AS APPROPRIATE Description: STG SCI Manage bowel program w/assist or as appropriate. Mod Outcome: Progressing   Problem: SCI BLADDER ELIMINATION Goal: RH STG SCI MANAGE BLADDER PROGRAM W/ASSISTANCE Description: Patient will manage bladder with mod assist Outcome: Progressing   Problem: RH SKIN INTEGRITY Goal: RH STG MAINTAIN SKIN INTEGRITY WITH ASSISTANCE Description: STG Maintain Skin Integrity With Assistance. Mod Outcome: Progressing Goal: RH STG ABLE TO PERFORM INCISION/WOUND CARE W/ASSISTANCE Description: STG Able To Perform Incision/Wound Care With Assistance. Max Outcome: Progressing

## 2019-10-21 NOTE — Progress Notes (Signed)
Social Work Assessment and Plan   Patient Details  Name: Ryan Reed MRN: [id number] Date of Birth: [birth year]  Today's Date: 10/21/2019  Problem List:  Patient Active Problem List   Diagnosis Date Noted  . Quadriplegia ([place]) 10/19/2019  . 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 Epidural abscess 10/14/2019   Past Medical History: No past medical history on file. Past Surgical History:  Past Surgical History:  Procedure Laterality Date  . LACERATION REPAIR Left    left thigh  . THORACIC LAMINECTOMY FOR EPIDURAL ABSCESS N/A 10/14/2019   Procedure: Cervical seven - THORACIC seven LAMINECTOMIES FOR EPIDURAL ABSCESS;  Surgeon: [name], MD;  Location: [facility];  Service: Neurosurgery;  Laterality: N/A;   Social History:  reports that he quit smoking about 30 years ago. His smoking use included cigarettes. He does not have any smokeless tobacco history on file. He reports current alcohol use of about 12.0 - 14.0 standard drinks of alcohol per week. He reports current drug use.  Family / Support Systems Marital Status: Married Patient Roles: Spouse, Parent Spouse/Significant Other: wife, Daryus Lapan @ [phone] Children: son, Thurmond Butts and daughter-in-law, Ander Purpura (local);  daughter, Judson Roch (local) and son in [place] Anticipated Caregiver: wife-and has grown son and daughter that live nearby Ability/Limitations of Caregiver: Min/Mod A  Caregiver Availability: 24/7 Family Dynamics: Pt and wife very supportive of one another.  Pt tearful at times during interview and wife quick to console/ support.  She is very committed to providing any needed assistance for pt at home.  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 Legal History/Current Legal Issues: None Guardian/Conservator: None - per MD, pt is capable of making decisions on his own behalf.   Abuse/Neglect Abuse/Neglect Assessment Can Be Completed: Yes Physical Abuse: Denies Verbal Abuse: Denies Sexual Abuse: Denies Exploitation of patient/patient's resources: Denies Self-Neglect: Denies  Emotional Status Pt's affect, behavior and adjustment status: Pt lying in bed and reports very fatigued from therapies.  Wife at bedside.  Both involved in assessment interview and very pleasant and hopeful for CIR tx.  Pt does become a little tearful when he talks about the mouth pain he was having "I sould have done more".  Pt realistic about the level of his debility and the long term recovery expected.  Very open to offer of neuropsychology consult.- will refer. Recent Psychosocial Issues: None Psychiatric History: None Substance Abuse History: None  Patient / Family Perceptions, Expectations & Goals Pt/Family understanding of illness & functional limitations: Pt and wife with good understanding of infection, surgery performed and resulting functional limitations/ need for CIR. Premorbid pt/family roles/activities: Pt was completely independent and working f/t Anticipated changes in roles/activities/participation: will require 24/7 physical assistance - wife to be primary caregiver. Pt/family expectations/goals: Pt and wife are realistic that he will need physical assistance for long term.  Note that their son and dtr-in-law are encouraging them to consider moving in with them.  Community Resources [employer]: None Premorbid Home Care/DME Agencies: None Transportation available at discharge: yes Resource referrals recommended: Neuropsychology  Discharge Planning Living Arrangements: Spouse/significant other Support Systems: Spouse/significant other, Children Type of Residence: Private residence Insurance Resources: Medicare(UHC  Medicare) Museum/gallery curator Resources: Fish farm manager, Employment Financial Screen Referred: No Living Expenses: Medical laboratory scientific officer Management: Patient, Spouse Does the patient have  any problems obtaining your medications?: No Home Management: pt and wife share Patient/Family Preliminary Plans: Pt to return home with wife, however, they are considering going to son's home which may prove for w/c accessible. Social Work Anticipated Follow Up Needs: HH/OP Expected length of stay: 4 weeks  Clinical Impression Very pleasant gentleman here for quadriplegia following surgery for epidural abscess likely r/t recent dental abscess.  Wife at bedside and very supportive.  Local, adult children also very involved.  Pt admits frustration and concerns of long term disability - will refer for neuropsychology for additional emotional support.   Luanna Weesner 10/21/2019, 3:16 PM

## 2019-10-21 NOTE — Progress Notes (Signed)
Occupational Therapy Session Note  Patient Details  Name: Ryan Reed MRN: [id number] Date of Birth: 1950/12/10  Today's Date: 10/21/2019 OT Individual Time: [id number] OT Individual Time Calculation (min): 100 min    Short Term Goals: Week 1:  OT Short Term Goal 1 (Week 1): Pt will maintain sitting balance at EOB for 5 minutes with max A of 1 OT Short Term Goal 2 (Week 1): Pt will complete slideboard transfer with max A +2 OT Short Term Goal 3 (Week 1): Pt will complete UB bathing sittting in wc at the sink with min A  Skilled Therapeutic Interventions/Progress Updates:    Pt seen for OT Sesssion focusing on ADL re-training, functional mobility and OOB tolerance.  Pt awake in supine upon arrival, denied pain and agreeable to tx session.  Transfers: Rolling R/L in bed, min A for management of LEs and VCs for technique using hospital bed functions. Bed<> recliner with use of MaxiSky  ADL re-training: Positioning in supported semi long sitting/circle sitting position. Pt able to manage LEs in order to reach to don/doff L sock and to thread B LEs into pants as part of LB dressing. Rolled with min A, able to reach behind to pull pants up. TED hose donned total A.  Pt with incontinent BM during session, returned to bed and total A +2 for hygiene and donning new brief.   Education: Provided throughout session regarding role of OT, POC, SCI, decreased sensation and functional implications, pressure relief, bowel/bladder program, continuum of care, and d/c planning.   Transitioned to recliner, BP as follows. Pt tolerated ~20 minutes up in recliner before having incontinent BM and needing to return to bed for hygiene.  BP sitting upright in recliner (no TEDS): 95/59 Reclined in recliner with LEs elevated and TED hose: 105/63  Transferred back to bed. Pt began shivering, curled up in blankets. Vitals assessed, 121/78. RN made aware. Pt left in supine with all needs in reach.   Therapy  Documentation Precautions:  Precautions Precautions: Cervical, Back Restrictions Weight Bearing Restrictions: No   Therapy/Group: Individual Therapy  Marlet Korte L 10/21/2019, 7:06 AM

## 2019-10-22 ENCOUNTER — Inpatient Hospital Stay (HOSPITAL_COMMUNITY): Payer: Medicare Other

## 2019-10-22 ENCOUNTER — Encounter (HOSPITAL_COMMUNITY): Payer: Medicare Other | Admitting: Psychology

## 2019-10-22 ENCOUNTER — Inpatient Hospital Stay (HOSPITAL_COMMUNITY): Payer: Medicare Other | Admitting: Physical Therapy

## 2019-10-22 ENCOUNTER — Inpatient Hospital Stay (HOSPITAL_COMMUNITY): Payer: Medicare Other | Admitting: Occupational Therapy

## 2019-10-22 DIAGNOSIS — G8250 Quadriplegia, unspecified: Secondary | ICD-10-CM | POA: Diagnosis not present

## 2019-10-22 DIAGNOSIS — G8220 Paraplegia, unspecified: Secondary | ICD-10-CM

## 2019-10-22 DIAGNOSIS — B9689 Other specified bacterial agents as the cause of diseases classified elsewhere: Secondary | ICD-10-CM | POA: Diagnosis not present

## 2019-10-22 DIAGNOSIS — G061 Intraspinal abscess and granuloma: Secondary | ICD-10-CM

## 2019-10-22 NOTE — Plan of Care (Signed)
Problem: Education: Goal: Ability to verbalize activity precautions or restrictions will improve 10/22/2019 1006 by Romana Juniper, LPN Outcome: Progressing 10/22/2019 1006 by Romana Juniper, LPN Outcome: Progressing Goal: Knowledge of the prescribed therapeutic regimen will improve 10/22/2019 1006 by Romana Juniper, LPN Outcome: Progressing 10/22/2019 1006 by Romana Juniper, LPN Outcome: Progressing Goal: Understanding of discharge needs will improve 10/22/2019 1006 by Romana Juniper, LPN Outcome: Progressing 10/22/2019 1006 by Romana Juniper, LPN Outcome: Progressing   Problem: Activity: Goal: Ability to avoid complications of mobility impairment will improve 10/22/2019 1006 by Romana Juniper, LPN Outcome: Progressing 10/22/2019 1006 by Romana Juniper, LPN Outcome: Progressing Goal: Ability to tolerate increased activity will improve 10/22/2019 1006 by Romana Juniper, LPN Outcome: Progressing 10/22/2019 1006 by Romana Juniper, LPN Outcome: Progressing Goal: Will remain free from falls 10/22/2019 1006 by Romana Juniper, LPN Outcome: Progressing 10/22/2019 1006 by Romana Juniper, LPN Outcome: Progressing   Problem: Bowel/Gastric: Goal: Gastrointestinal status for postoperative course will improve 10/22/2019 1006 by Romana Juniper, LPN Outcome: Progressing 10/22/2019 1006 by Romana Juniper, LPN Outcome: Progressing   Problem: Clinical Measurements: Goal: Ability to maintain clinical measurements within normal limits will improve 10/22/2019 1006 by Romana Juniper, LPN Outcome: Progressing 10/22/2019 1006 by Romana Juniper, LPN Outcome: Progressing Goal: Postoperative complications will be avoided or minimized 10/22/2019 1006 by Romana Juniper, LPN Outcome: Progressing 10/22/2019 1006 by Romana Juniper, LPN Outcome: Progressing Goal: Diagnostic test results will improve 10/22/2019 1006 by Romana Juniper, LPN Outcome:  Progressing 10/22/2019 1006 by Romana Juniper, LPN Outcome: Progressing   Problem: Pain Management: Goal: Pain level will decrease 10/22/2019 1006 by Romana Juniper, LPN Outcome: Progressing 10/22/2019 1006 by Romana Juniper, LPN Outcome: Progressing   Problem: Skin Integrity: Goal: Will show signs of wound healing 10/22/2019 1006 by Romana Juniper, LPN Outcome: Progressing 10/22/2019 1006 by Romana Juniper, LPN Outcome: Progressing   Problem: Health Behavior/Discharge Planning: Goal: Identification of resources available to assist in meeting health care needs will improve 10/22/2019 1006 by Romana Juniper, LPN Outcome: Progressing 10/22/2019 1006 by Romana Juniper, LPN Outcome: Progressing   Problem: Bladder/Genitourinary: Goal: Urinary functional status for postoperative course will improve 10/22/2019 1006 by Romana Juniper, LPN Outcome: Progressing 10/22/2019 1006 by Romana Juniper, LPN Outcome: Progressing   Problem: Consults Goal: RH SPINAL CORD INJURY PATIENT EDUCATION Description:  See Patient Education module for education specifics.  10/22/2019 1006 by Romana Juniper, LPN Outcome: Progressing 10/22/2019 1006 by Romana Juniper, LPN Outcome: Progressing   Problem: Consults Goal: RH SPINAL CORD INJURY PATIENT EDUCATION Description:  See Patient Education module for education specifics.  10/22/2019 1006 by Romana Juniper, LPN Outcome: Progressing 10/22/2019 1006 by Romana Juniper, LPN Outcome: Progressing   Problem: SCI BOWEL ELIMINATION Goal: RH STG SCI MANAGE BOWEL PROGRAM W/ASSIST OR AS APPROPRIATE Description: STG SCI Manage bowel program w/assist or as appropriate. Mod 10/22/2019 1006 by Romana Juniper, LPN Outcome: Progressing 10/22/2019 1006 by Romana Juniper, LPN Outcome: Progressing   Problem: SCI BLADDER ELIMINATION Goal: RH STG SCI MANAGE BLADDER PROGRAM W/ASSISTANCE Description: Patient will manage bladder with  mod assist 10/22/2019 1006 by Romana Juniper, LPN Outcome: Progressing 10/22/2019 1006 by Romana Juniper, LPN Outcome: Progressing   Problem: RH SKIN INTEGRITY Goal: RH STG MAINTAIN SKIN INTEGRITY WITH ASSISTANCE Description: STG Maintain Skin Integrity With Assistance. Mod 10/22/2019 1006 by Romana Juniper, LPN  Outcome: Progressing 10/22/2019 1006 by Romana Juniper, LPN Outcome: Progressing Goal: RH STG ABLE TO PERFORM INCISION/WOUND CARE W/ASSISTANCE Description: STG Able To Perform Incision/Wound Care With Assistance. Max 10/22/2019 1006 by Romana Juniper, LPN Outcome: Progressing 10/22/2019 1006 by Romana Juniper, LPN Outcome: Progressing

## 2019-10-22 NOTE — Progress Notes (Signed)
Physical Therapy Session Note  Patient Details  Name: Ryan Reed MRN: [id number] Date of Birth: [birth year]  Today's Date: 10/22/2019 PT Individual Time: [id number] PT Individual Time Calculation (min): 72 min   Short Term Goals: Week 1:  PT Short Term Goal 1 (Week 1): Pt will transfer to and from [facility] with max assist +2 PT Short Term Goal 2 (Week 1): Pt will propell WC x 147ft with min assist PT Short Term Goal 3 (Week 1): Pt will sit EOB with mod assist up to 5 min  Skilled Therapeutic Interventions/Progress Updates:   Pt presents up in w/c and reports some mild lightheadedness occasionally. Discussed options with ACE wraps or abdominal binder if BP values drop with more OOB activity. Pt currently just using knee-high tedhose. Total assist for transport to/from w/c for energy conservation. Demonstrated slideboard transfer technique and educated on importance of weightshift and head/hips relationship. Pt requires max assist for anterior weightshift and +2 assist for placement of slideboard. Total +2 slideboard transfer w/c <> mat with focus on technique, postural control, and head/hips relationship. Reciprocal scooting on mat with max +2 assist for balance and weightshift. NMR to focus on trunk/postural control re-training, core activation, trunk shortening and elongation, and overall upright tolerance and balance with and without UE support. Pt requires min to total assist for balance due to poor trunk control and balance reactions. Pt is able to maintain static sitting balance with bilateral hands on lap about 30 secs with CGA a few times during session. Propped on elbow (with 2 pillows) x 5-6 reps each side to break down bed mobility task or for preparation for slideboard transfer techniques with mod to max assist and total assist to recover if LOB occurs. Education provided throughout session and pt very receptive. Used maxi sky lift to transfer back to bed at end of session due to type of air  bed and pt level of fatigue. Rolled with min assist using rails (PT assisted with leg) for sling removal. Discussed asking about order for bilateral PRAFO boots for contracture prevention and improved alignment of foot as well as recommendation for wearing shoes during the day. Pt in agreement and verbalized understanding.   Therapy Documentation Precautions:  Precautions Precautions: Cervical, Back Restrictions Weight Bearing Restrictions: No Vital Signs: Therapy Vitals BP: (!) 88/56; HR = 66 bpm Patient Position (if appropriate): Sitting  BP: 99/62 mmHg seated; HR = 65 bpm BP: 102/77 mmHg seated EOM after balance retraining  Only reports mildly symptomatic initially.  Pain: Reports back pain 3/10 - declines need for pain medication    Therapy/Group: Individual Therapy  Canary Brim Ivory Broad, PT, DPT, CBIS  10/22/2019, 12:01 PM

## 2019-10-22 NOTE — Progress Notes (Signed)
Dig stimulation performed, no results as of yet. Will inform night shift to complete bowel program.  Ryan Reed

## 2019-10-22 NOTE — Consult Note (Signed)
Neuropsychological Consultation   Patient:   Ryan Reed   DOB:   04/19/1951  MR Number:  [id number]  Location:  [place] [id number] [place] [address] Dept: [place]: [phone]           Date of Service:   10/22/2019  Start Time:   2 PM End Time:   3 PM  Provider/Observer:  Ilean Skill, Psy.D.       Clinical Neuropsychologist       Billing Code/Service: [id number]  Chief Complaint:    Ryan Reed is a 68 year old male who has been in relatively good health with the exception of possible undiagnosed obstructive sleep apnea.  The patient developed severe back and chest pain.  Initially seen in urgent care 10/13/2019 with recommendations to go to the ED but did not 3. he developed acute onset of paraplegia with sensory loss and was admitted on 10/14/2019 for work-up.  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  Patient taken the OR emergently for decompressive laminectomy by Dr. Ronnald Ramp.  Patient is recommended for 8 weeks of IV antibiotic therapy.  Patient with paraplegia and sensory deficits from the chest down.  Patient reports now that he has some sensation in his legs but nearly no motor movement from his chest down bilaterally.  Reason for Service:  Patient was referred for neuropsychological consultation due to coping and adjustment issues due to loss of function/paraplegia.  Below is the HPI for the current admission.  Ryan Reed is a 68 year old male in relatively good health except for possibly undiagnosed OSA, normocytic anemia who was developed severe back and chest pain--seen at urgent care on 10/13/19 with recommendations to go to ED but declined. He developed acute onset of paraplegia with sensory loss and  was admitted on 10/14/19 for work up. 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 He was taken to the OR emergently for C7-T7 posterior decompressive laminectomy with medial facetectomy and foraminotomy for evacuation of epidural abscess by Dr. Ronnald Ramp. Postop had acute blood loss anemia requiring 4 units of packed red blood cells as well as A. fib requiring beta-blocker for rate control. Wound culture shows abundant Gemella morbilliform and he was started on Vanco/cefepime initially--this was narrowed to Rocephin 2 g twice daily on11/25.2D echo done showed EF of 60 to 65% with increased LVH--no evidence of endocarditis. Dr. Graylon Good recommended 8 weeks of IV antibiotic therapy and will follow up for further recommendations. Patient with paraplegia and sensory deficits from chest down. Foley in place due to neurogenic bladder and he has not had a bowel movement except for small accident this a.m. Therapy has been ongoing and working on bed mobility. CIR recommended due to functional decline  Current Status:  While the patient was tearful at times but overall he was in good spirits reported no significant symptoms of depression or anxiety.  The patient reports good understanding of purposeful rehabilitative efforts and reports good motivation to continue functioning.  Cognition and mental status were quite good.  Behavioral Observation: Ryan Reed  presents as a 68 y.o.-year-old Right Caucasian Male who appeared his stated age. his dress was  Appropriate and he was Well Groomed and his manners were Appropriate to the situation.  his participation was indicative of Appropriate and Attentive behaviors.  There were any physical disabilities noted.  he displayed an appropriate level of cooperation and motivation.     Interactions:    Active Appropriate and  Attentive  Attention:   within normal limits and attention span and concentration were age appropriate  Memory:   within normal limits; recent and remote memory intact  Visuo-spatial:  not examined  Speech (Volume):  normal  Speech:   normal; normal  Thought Process:  Coherent and Relevant  Though Content:  WNL; not suicidal and not homicidal  Orientation:   person, place, time/date and situation  Judgment:   Good  Planning:   Good  Affect:    Appropriate  Mood:    Dysphoric  Insight:   Good  Intelligence:   normal  Medical History:  No past medical history on file.  Psychiatric History:  No prior psychiatric history.  Family Med/Psych History:  Family History  Problem Relation Age of Onset  . Heart disease Father   . Cardiomyopathy Sister    Impression/DX:  Ryan Reed is a 68 year old male who has been in relatively good health with the exception of possible undiagnosed obstructive sleep apnea.  The patient developed severe back and chest pain.  Initially seen in urgent care 10/13/2019 with recommendations to go to the ED but did not 3. he developed acute onset of paraplegia with sensory loss and was admitted on 10/14/2019 for work-up.  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  Patient taken the OR emergently for decompressive laminectomy by Dr. Ronnald Ramp.  Patient is recommended for 8 weeks of IV antibiotic therapy.  Patient with paraplegia and sensory deficits from the chest down.  Patient reports now that he has some sensation in his legs but nearly no motor movement from his chest down bilaterally.  While the patient was tearful at times but overall he was in good spirits reported no significant symptoms of depression or anxiety.  The patient reports good understanding of purposeful rehabilitative efforts and reports good  motivation to continue functioning.  Cognition and mental status were quite good.  Disposition/Plan:  Worked on cognitive deficits with issues related to his paraplegia.  Patient remains quite hopeful that he will continue to gain the therapeutic efforts.  I will follow-up with the patient next week.  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

## 2019-10-22 NOTE — Plan of Care (Signed)
  Problem: Education: Goal: Ability to verbalize activity precautions or restrictions will improve Outcome: Progressing Goal: Knowledge of the prescribed therapeutic regimen will improve Outcome: Progressing Goal: Understanding of discharge needs will improve Outcome: Progressing   Problem: Activity: Goal: Ability to avoid complications of mobility impairment will improve Outcome: Progressing Goal: Ability to tolerate increased activity will improve Outcome: Progressing Goal: Will remain free from falls Outcome: Progressing   Problem: Bowel/Gastric: Goal: Gastrointestinal status for postoperative course will improve Outcome: Progressing   Problem: Clinical Measurements: Goal: Ability to maintain clinical measurements within normal limits will improve Outcome: Progressing Goal: Postoperative complications will be avoided or minimized Outcome: Progressing Goal: Diagnostic test results will improve Outcome: Progressing   Problem: Pain Management: Goal: Pain level will decrease Outcome: Progressing   Problem: Skin Integrity: Goal: Will show signs of wound healing Outcome: Progressing   Problem: Health Behavior/Discharge Planning: Goal: Identification of resources available to assist in meeting health care needs will improve Outcome: Progressing   Problem: Bladder/Genitourinary: Goal: Urinary functional status for postoperative course will improve Outcome: Progressing   Problem: Consults Goal: RH SPINAL CORD INJURY PATIENT EDUCATION Description:  See Patient Education module for education specifics.  Outcome: Progressing   Problem: Consults Goal: RH SPINAL CORD INJURY PATIENT EDUCATION Description:  See Patient Education module for education specifics.  Outcome: Progressing   Problem: SCI BOWEL ELIMINATION Goal: RH STG SCI MANAGE BOWEL PROGRAM W/ASSIST OR AS APPROPRIATE Description: STG SCI Manage bowel program w/assist or as appropriate. Mod Outcome: Progressing   Problem: SCI BLADDER ELIMINATION Goal: RH STG SCI MANAGE BLADDER PROGRAM W/ASSISTANCE Description: Patient will manage bladder with mod assist Outcome: Progressing   Problem: RH SKIN INTEGRITY Goal: RH STG MAINTAIN SKIN INTEGRITY WITH ASSISTANCE Description: STG Maintain Skin Integrity With Assistance. Mod Outcome: Progressing Goal: RH STG ABLE TO PERFORM INCISION/WOUND CARE W/ASSISTANCE Description: STG Able To Perform Incision/Wound Care With Assistance. Max Outcome: Progressing

## 2019-10-22 NOTE — Progress Notes (Signed)
Dig stimulation performed and tolerated well, no results currently, will monitor closely

## 2019-10-22 NOTE — Patient Care Conference (Addendum)
Inpatient RehabilitationTeam Conference and Plan of Care Update Date: 10/22/2019   Time: 11:00 AM   Patient Name: Ryan Reed      Medical Record Number: [id number]  Date of Birth: May 08, 1951 Sex: Male         Room/Bed: 4W14C/4W14C-01 Payor Info: Payor: [place] / Plan: [facility] MEDICARE / Product Type: *No Product type* /    Admit Date/Time:  10/18/2019  5:03 PM  Primary Diagnosis:  Quadriplegia [facility])  Patient Active Problem List   Diagnosis Date Noted  . Quadriplegia ([place]) 10/19/2019  . 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 Epidural abscess 10/14/2019    Expected Discharge Date: Expected Discharge Date: (Estimated LOS 4 weeks)  Team Members Present: Physician leading conference: Dr. Courtney Heys Social Worker Present: Ovidio Kin, LCSW Nurse Present: Other (comment)(Blair Montanti, LPN) Case Manager: Karene Fry, RN PT Present: Excell Seltzer, PT OT Present: Amy Rounds, OT SLP Present: Jettie Booze, CF-SLP PPS Coordinator present : Gunnar Fusi, SLP     Current Status/Progress Goal Weekly Team Focus  Bowel/Bladder   Incontinent of bowel. LBM 1130. Foley catheter in place  Continent of Bowel and normal bowel pattern  assess and monitor q shift   Swallow/Nutrition/ Hydration             ADL's   Maxi-sky transfers, mod A LB bathing/dressing from bed level, UB dressing with steadying assist in unsupported sitting. min-mod A sitting balance  Min A overall  OOB tolerance, functional mobility, ADL re-training, SCI education, sitting balance/endurance   Mobility   min A rolling, mod to max A sitting balance, transfer via maxisky  min A transfers, mod A bed mobility, S at w/c level  OOB tolerance, transfers, sitting balance   Communication             Safety/Cognition/ Behavioral Observations            Pain   lower back pain. Robaxin, ultram and tylenol PRN  2/10  assess and monitor q shift. Give PRN  medications as ordered   Skin   Pressure injury to buttocks and scrotum, blistered area on right buttock  improvement of pressure injury. No futher skin breakdown free of infection  assess and monitor q shift.    Rehab Goals Patient on target to meet rehab goals: Yes *See Care Plan and progress notes for long and short-term goals.     Barriers to Discharge  Current Status/Progress Possible Resolutions Date Resolved   Nursing                  PT  Inaccessible home environment;Decreased caregiver support;Home environment access/layout;Neurogenic Bowel & Bladder                 OT                  SLP                SW                Discharge Planning/Teaching Needs:  Pt to d/c home with wife who can provide 24/7 assistance.  Local son and daughter-in-law also to assist.  Teaching needs TBD   Team Discussion: Epidural abscess from mouth infection, neurogenic B/B, B DVT, on eliquis, pressure injury on buttocks, Hgb 7.9, AFib, IV abx X 8 weeks.  RN - back pain, no meds needed, DC catheter tomorrow. OT maxisky transfers, mod A LB B/D.  PT  min bed, transfers maxisky, ? Need air matress vs reg bed?   Revisions to Treatment Plan: N/A     Medical Summary Current Status: little spot on R buttock that's erythematous; taking foley out tomorrow; min assist in bed; w/c supervision; Weekly Focus/Goal: get foley out  Barriers to Discharge: Incontinence;Neurogenic Bowel & Bladder;Weight;Home enviroment access/layout;Medical stability;IV antibiotics;Wound care;Decreased family/caregiver support  Barriers to Discharge Comments: n/a Possible Resolutions to Barriers: 4 weeks   Continued Need for Acute Rehabilitation Level of Care: The patient requires daily medical management by a physician with specialized training in physical medicine and rehabilitation for the following reasons: Direction of a multidisciplinary physical rehabilitation program to maximize functional independence : Yes Medical  management of patient stability for increased activity during participation in an intensive rehabilitation regime.: Yes Analysis of laboratory values and/or radiology reports with any subsequent need for medication adjustment and/or medical intervention. : Yes   I attest that I was present, lead the team conference, and concur with the assessment and plan of the team.   Retta Diones 10/22/2019, 2:20 PM  Team conference was held via web/ teleconference due to [place] - 19

## 2019-10-22 NOTE — Progress Notes (Signed)
[place] PHYSICAL MEDICINE & REHABILITATION PROGRESS NOTE   Subjective/Complaints:  Pt reports feeling better today- Didn't receive suppository last night or any dig stim- and the timing has been back and forth when it's done overall.  Also feeling muscles in back and abdomen better- a little sore.  Also notes, muscles jerk in LEs. Still has foley- wasn't removed.  ROS- denies SOB, CP, N/V/D, HA.  Objective:   No results found. Recent Labs    10/21/19 0317  WBC 8.6  HGB 7.9*  HCT 24.3*  PLT 412*   Recent Labs    10/21/19 0317  NA 133*  K 3.6  CL 101  CO2 25  GLUCOSE 139*  BUN 11  CREATININE 0.52*  CALCIUM 7.8*    Intake/Output Summary (Last 24 hours) at 10/22/2019 0949 Last data filed at 10/22/2019 0804 Gross per 24 hour  Intake 240 ml  Output 1770 ml  Net -1530 ml     Physical Exam: Vital Signs Blood pressure 115/63, pulse 71, temperature 98.6 F (37 C), resp. rate 17, SpO2 98 %.  Physical Exam Nursing noteand vitalsreviewed. Constitutional: awake, alert, appropriate, lying supine on bed- nursing in room to discuss bowel program; NAD  HENT:  Head:Normocephalicand atraumatic.  Right Ear: External earnormal.  Eyes:conjugate gaze Neck:Normal range of motion. CV: RRR  Respiratory:CTA B/L  VO:JJKK. He exhibitsno distension. There isno abdominal tenderness.  Musculoskeletal:  General: No deformity. d Comments: 1+ pedal edema bilaterally Neurological: He is alertand oriented to person, place, and time. Nocranial nerve deficit. Intact sensation in UE. Impaired sensation otherwise below chest. Biceps 5/5, triceps 5/5, WE 5/5; grip 4/5; finger abd 4/5 B/L. LE 0/5 except for perhaps some trace movement in toes. DTR's absent in LE still. Saw a few muscles spasms in LEs without volitional movement Skin: Skin iswarmand dry.No rashnoted. He is not diaphoretic.; still has foley in place Noerythema. Pressure injury to buttocks and  scrotum--blistered area on right buttock and on underside of scrotum.   Assessment/Plan: 1. Functional deficits secondary to C7 quadriplegia/ ASIA B with neurogenic bowel and bladder which require 3+ hours per day of interdisciplinary therapy in a comprehensive inpatient rehab setting.  Physiatrist is providing close team supervision and 24 hour management of active medical problems listed below.  Physiatrist and rehab team continue to assess barriers to discharge/monitor patient progress toward functional and medical goals  Care Tool:  Bathing    Body parts bathed by patient: Right arm, Left arm, Chest, Abdomen, Front perineal area, Face   Body parts bathed by helper: Buttocks, Right upper leg, Left upper leg, Right lower leg, Left lower leg     Bathing assist Assist Level: 2 Helpers     Upper Body Dressing/Undressing Upper body dressing   What is the patient wearing?: Pull over shirt    Upper body assist Assist Level: 2 Helpers(EOB)    Lower Body Dressing/Undressing Lower body dressing      What is the patient wearing?: Incontinence brief     Lower body assist Assist for lower body dressing: Dependent - Patient 0%     Toileting Toileting Toileting Activity did not occur (Clothing management and hygiene only): N/A (no void or bm)  Toileting assist Assist for toileting: 2 Helpers     Transfers Chair/bed transfer  Transfers assist  Chair/bed transfer activity did not occur: Safety/medical concerns  Chair/bed transfer assist level: Dependent - mechanical lift(maxi sky)     Locomotion Ambulation   Ambulation assist   Ambulation activity did  not occur: Safety/medical concerns          Walk 10 feet activity   Assist  Walk 10 feet activity did not occur: Safety/medical concerns        Walk 50 feet activity   Assist Walk 50 feet with 2 turns activity did not occur: Safety/medical concerns         Walk 150 feet activity   Assist Walk 150  feet activity did not occur: Safety/medical concerns         Walk 10 feet on uneven surface  activity   Assist Walk 10 feet on uneven surfaces activity did not occur: Safety/medical concerns         Wheelchair     Assist Will patient use wheelchair at discharge?: Yes Type of Wheelchair: Manual Wheelchair activity did not occur: Safety/medical concerns         Wheelchair 50 feet with 2 turns activity    Assist    Wheelchair 50 feet with 2 turns activity did not occur: Safety/medical concerns       Wheelchair 150 feet activity     Assist  Wheelchair 150 feet activity did not occur: Safety/medical concerns       Blood pressure 115/63, pulse 71, temperature 98.6 F (37 C), resp. rate 17, SpO2 98 %.  Medical Problem List and Plan: 1.C7 Quadriplegia ASIA B and functional deficitssecondary to C7-T1 epidural abscess with associated paraplegia -patient maynot yetshower -ELOS/Goals: min assist PT/OT 16-20 days 2. Antithrombotics: -DVT/anticoagulation:Mechanical:Sequential compression devices, below kneeBilateral lower extremities 11/28- will try and get NSU to allow Lovenox due to  high risk of DVT in SCI patients.  11/29- has B/L gastroc DVTs- started Apixiban 10 mg BID x 1 week then 5 mg BID for DVTs.  -antiplatelet therapy: N/A 3. Pain Management:does not like oxycodone due to SE--will change to ultram prn.Encouragedpatient to take muscle relaxers and Tylenol as needed 4. Mood:LCSW to follow for evaluation and support -antipsychotic agents: N/A 5. Neuropsych: This patientiscapable of making decisions onhisown behalf. 6. Skin/Wound Care:Routine pressure relief measureswith local care to sacrum. Sullivan RN consult as appropriate. 7. Fluids/Electrolytes/Nutrition:Monitor I's/O. Check lytesin a.m. 8.Acute blood loss anemia: Recheck CBC in a.m.  11/28- Hb 7.8- might cause more problems with  orthostatic hypotension- will need abd binder, TEDs, and possibly Midodrine. Will monitor.   11/29- no TEDs for now. No SCDs 9.PAF: Monitor heart rate 3 times daily. Continue metoprolol 25 mg twice daily 10. Neurogenic bladder: We will keep Foley in place for the next 48 to 72 hours until bowel program adjusted  12/1- will remove Foley in AM and start in/out caths- add lidocaine as well 11.Neurogenic bowel: Reports p.o. intake has been poor due to change in taste---has been drinking a lot of smoothies. Discussed dietary needs and changes. Change senna to 2 every morning with suppositories in evening after supper for PM program. Start today. Will check KUB to evaluatestool burdenas reporting abdominal pain since yesterday.  11/28- multiple BMs today- is cleaning self out- needs Bowel program this evening.  11/29- 2 BMs last night/Overnight-   12/1- didn't get bowel program last night- went over with nursing time for bowel program- evening after dinner. Between day and evening shift, needs to be done. 12. Leucocytosis in setting of Epidural abscess- on IV ABX x 8 weeks: Follow up CBC in am.  11/28- resolved leukocytosis- will monitor labs at least weekly-  121- ESR 82; CRP 11.6- will monitor weekly for now.  13. Hyperglycemia: Stress Induced  hyperglycemia v/s IPG--will check Hgb A1C in am.  11/29- A1c 5.6         LOS: 4 days A FACE TO FACE EVALUATION WAS PERFORMED  Jerrica Thorman 10/22/2019, 9:49 AM

## 2019-10-22 NOTE — Progress Notes (Signed)
Physical Therapy Session Note  Patient Details  Name: STEPFON MEYEROWITZ MRN: [id number] Date of Birth: 1950/12/12  Today's Date: 10/22/2019 PT Individual Time: [id number] PT Individual Time Calculation (min): 60 min   Short Term Goals: Week 1:  PT Short Term Goal 1 (Week 1): Pt will transfer to and from [facility] with max assist +2 PT Short Term Goal 2 (Week 1): Pt will propell WC x 123ft with min assist PT Short Term Goal 3 (Week 1): Pt will sit EOB with mod assist up to 5 min  Skilled Therapeutic Interventions/Progress Updates:    Pt received supine in bed, agreeable to PT session. No complaints of pain, does report soreness in upper back region at surgical site as well as abdominal soreness from previous date's sessions. Rolling L/R with min A for LE management with use of bedrail to don shorts with max A and to place maxi sky sling. Maxisky transfer bed to w/c with assist x 2 for safety and line management. Seated BP initially 99/47 with use of B TEDs. Manual w/c propulsion x 150 ft with use of BUE and Supervision with cueing for technique. Pt is able to lock w/c brakes with min cueing, dependent for management of B ELR. Seated BP following w/c propulsion 103/59. Pt with lightheadedness upon initially transferring into seated position that subsides with seated reat break. Seated anterior leans x 5 reps with min to mod A to maintain upright sitting balance with reliance on 1-2 UE for support. Pt agreeable to stay seated in w/c at end of session. Pt left seated in w/c in room with needs in reach at end of session.  Therapy Documentation Precautions:  Precautions Precautions: Cervical, Back Restrictions Weight Bearing Restrictions: No    Therapy/Group: Individual Therapy   Excell Seltzer, PT, DPT  10/22/2019, 12:35 PM

## 2019-10-22 NOTE — Progress Notes (Signed)
Occupational Therapy Session Note  Patient Details  Name: Ryan Reed MRN: [id number] Date of Birth: 05-22-1951  Today's Date: 10/22/2019 OT Individual Time: 1303-1400 OT Individual Time Calculation (min): 57 min    Short Term Goals: Week 1:  OT Short Term Goal 1 (Week 1): Pt will maintain sitting balance at EOB for 5 minutes with max A of 1 OT Short Term Goal 2 (Week 1): Pt will complete slideboard transfer with max A +2 OT Short Term Goal 3 (Week 1): Pt will complete UB bathing sittting in wc at the sink with min A  Skilled Therapeutic Interventions/Progress Updates:    Upon entering the room, pt supine in bed with family member present. Pt reports fatigue from prior therapy sessions and agreeable to UB self care tasks. Pt washing UB while HOB elevated with set up A and min A for balance as pt with LOB L <> R. Pt rolling L <> R with mod A to wash back and pull shirt down trunk. Pt was able to thread B UEs into shirt and partially over neck before needing assistance. Pt needing max A for anterior weight shift to attempt long sitting this session. OT stretching B LEs for circle sitting but not fully able to get into this position during this session. Pt remained in bed at end of session with call bell and all needed items within reach.   Therapy Documentation Precautions:  Precautions Precautions: Cervical, Back Restrictions Weight Bearing Restrictions: No General:   Vital Signs: Therapy Vitals Temp: 98.8 F (37.1 C) Temp Source: Oral Pulse Rate: 82 Resp: 17 BP: 124/61 Patient Position (if appropriate): Lying Oxygen Therapy SpO2: 100 % O2 Device: Room Air Pain:   ADL: ADL Eating: Set up Grooming: Setup Upper Body Bathing: Minimal assistance, Moderate assistance Lower Body Bathing: Maximal assistance, Dependent Upper Body Dressing: Minimal assistance, Moderate assistance Lower Body Dressing: Dependent Toileting: Dependent Toilet Transfer: Unable to  assess Tub/Shower Transfer: Unable to assess   Therapy/Group: Individual Therapy  Gypsy Decant 10/22/2019, 4:57 PM

## 2019-10-23 ENCOUNTER — Inpatient Hospital Stay (HOSPITAL_COMMUNITY): Payer: Medicare Other | Admitting: Occupational Therapy

## 2019-10-23 ENCOUNTER — Inpatient Hospital Stay (HOSPITAL_COMMUNITY): Payer: Medicare Other | Admitting: Physical Therapy

## 2019-10-23 ENCOUNTER — Inpatient Hospital Stay (HOSPITAL_COMMUNITY): Payer: Medicare Other

## 2019-10-23 DIAGNOSIS — G8250 Quadriplegia, unspecified: Secondary | ICD-10-CM | POA: Diagnosis not present

## 2019-10-23 NOTE — Progress Notes (Signed)
Physical Therapy Session Note  Patient Details  Name: Ryan Reed MRN: [id number] Date of Birth: Jul 14, 1951  Today's Date: 10/23/2019 PT Individual Time: 1300-1355 PT Individual Time Calculation (min): 55 min   Short Term Goals: Week 1:  PT Short Term Goal 1 (Week 1): Pt will transfer to and from [facility] with max assist +2 PT Short Term Goal 2 (Week 1): Pt will propell WC x 128f with min assist PT Short Term Goal 3 (Week 1): Pt will sit EOB with mod assist up to 5 min  Skilled Therapeutic Interventions/Progress Updates: Pt presented in bed with wife [name] present agreeable to therapy. Session focused on monitoring BP and use of tilt table. Pt stating mild HA but does not want intervention. BP checked initially 103/56 (71) pulse 79. Pt explained use of tilt table and benefits with pt verbalizing understanding. Pt performed rolling L/R with minA for placement of sling. Transferred to tilt table via Maxi Sky and BP assessed once at 30 degrees and then as follows: Pt stating no symptoms of orthostatic hypotension nor increased pain during session.    30 degrees 97/66 (76) pulse 85 4 min 30 degrees 103/66 (79) pulse 81 6 min 40 degrees 102/57 (72) pulse 80 10 min 40 degrees 100/62 (75) pulse 78 12 min 40 degrees 106/59 (73) pulse 76 15 min 45 degrees 103/61 (75) pulse 77 17 min 50 degrees 102/61 (71) pulse 71 21 min 60 degrees 89/60 (68) pulse 80 asymptomatic   Pt stating with increased degrees was able to feel pressure of wt bearing but no pain. Pt returned [facility] bed [facility] performed rolling L/R in same manner to remove sling. PTA placed pillows under LEs and left with call bell within reach and needs met.      Therapy Documentation Precautions:  Precautions Precautions: Cervical, Back Restrictions Weight Bearing Restrictions: No General:   Vital Signs: Therapy Vitals Temp: 98.5 F (36.9 C) Pulse Rate: 72 Resp: 16 BP: (!) 109/58 Patient Position (if  appropriate): Lying Oxygen Therapy SpO2: 100 % O2 Device: Room Air    Therapy/Group: Individual Therapy  Tymothy Cass  Sarath Privott, PTA  10/23/2019, 3:47 PM

## 2019-10-23 NOTE — Progress Notes (Signed)
Patient still unable to have stool after dig stimulation and repositioning , denies discomfort ,

## 2019-10-23 NOTE — Progress Notes (Signed)
Per dayshift nurse, performed dig stim on pt. Pt only had smear. Will continue to monitor.

## 2019-10-23 NOTE — Progress Notes (Signed)
[facility] PHYSICAL MEDICINE & REHABILITATION PROGRESS NOTE   Subjective/Complaints:  Pt reports foley to come out after therapy today- explained will be cathing in/out likely, but if voids, great.  Didn't have results with full bowel program done last night- usually, in his life, goes 1-2x/day, doesn't usually miss- will give it until tomorrow, and then clean him out if needed.  Since very sore on backside when has Stage II, will maintain on low air loss bed until Monday then reassess because it makes bed mobility much harder.  ROS- denies SOB, CP, N/V/D, HA.  Objective:   No results found. Recent Labs    10/21/19 0317  WBC 8.6  HGB 7.9*  HCT 24.3*  PLT 412*   Recent Labs    10/21/19 0317  NA 133*  K 3.6  CL 101  CO2 25  GLUCOSE 139*  BUN 11  CREATININE 0.52*  CALCIUM 7.8*    Intake/Output Summary (Last 24 hours) at 10/23/2019 0935 Last data filed at 10/23/2019 0510 Gross per 24 hour  Intake 962 ml  Output 2900 ml  Net -1938 ml     Physical Exam: Vital Signs Blood pressure 104/60, pulse 72, temperature 98.6 F (37 C), resp. rate 17, SpO2 98 %.  Physical Exam Nursing noteand vitalsreviewed. Constitutional: awake, alert, appropriate,sititng up in manual w/c, nurse at bedside; bed is low pressure air loss bedNAD  HENT:  Head:Normocephalicand atraumatic.  Right Ear: External earnormal.  Eyes:conjugate gaze Neck:Normal range of motion. CV: RRR  Respiratory:CTA B/L  PP:JKDT. He exhibitsno distension. There isno abdominal tenderness. Still today (+)BS Musculoskeletal:  General: No deformity. d Comments: 1+ pedal edema bilaterally Neurological: He is alertand oriented to person, place, and time. Nocranial nerve deficit. Intact sensation in UE. Impaired sensation otherwise below chest. Biceps 5/5, triceps 5/5, WE 5/5; grip 4/5; finger abd 4/5 B/L. LE 0/5 except for perhaps some trace movement in toes. DTR's absent in LE still. Saw a few  muscles spasms in LEs without volitional movement Skin: Skin iswarmand dry.No rashnoted. He is not diaphoretic.; still has foley in place Noerythema. Pressure injury to buttocks and scrotum--blistered area on right buttock and on underside of scrotum.   Assessment/Plan: 1. Functional deficits secondary to C7 quadriplegia/ ASIA B with neurogenic bowel and bladder which require 3+ hours per day of interdisciplinary therapy in a comprehensive inpatient rehab setting.  Physiatrist is providing close team supervision and 24 hour management of active medical problems listed below.  Physiatrist and rehab team continue to assess barriers to discharge/monitor patient progress toward functional and medical goals  Care Tool:  Bathing    Body parts bathed by patient: Right arm, Left arm, Chest, Face, Abdomen(ub only)   Body parts bathed by helper: Buttocks, Right upper leg, Left upper leg, Right lower leg, Left lower leg     Bathing assist Assist Level: Set up assist     Upper Body Dressing/Undressing Upper body dressing   What is the patient wearing?: Pull over shirt    Upper body assist Assist Level: Moderate Assistance - Patient 50 - 74%    Lower Body Dressing/Undressing Lower body dressing      What is the patient wearing?: Incontinence brief     Lower body assist Assist for lower body dressing: Dependent - Patient 0%     Toileting Toileting Toileting Activity did not occur (Clothing management and hygiene only): N/A (no void or bm)  Toileting assist Assist for toileting: 2 Helpers     Transfers Chair/bed transfer  Transfers assist  Chair/bed transfer activity did not occur: Safety/medical concerns  Chair/bed transfer assist level: Dependent - mechanical lift     Locomotion Ambulation   Ambulation assist   Ambulation activity did not occur: Safety/medical concerns          Walk 10 feet activity   Assist  Walk 10 feet activity did not occur:  Safety/medical concerns        Walk 50 feet activity   Assist Walk 50 feet with 2 turns activity did not occur: Safety/medical concerns         Walk 150 feet activity   Assist Walk 150 feet activity did not occur: Safety/medical concerns         Walk 10 feet on uneven surface  activity   Assist Walk 10 feet on uneven surfaces activity did not occur: Safety/medical concerns         Wheelchair     Assist Will patient use wheelchair at discharge?: Yes Type of Wheelchair: Manual Wheelchair activity did not occur: Safety/medical concerns  Wheelchair assist level: Supervision/Verbal cueing Max wheelchair distance: 150'    Wheelchair 50 feet with 2 turns activity    Assist    Wheelchair 50 feet with 2 turns activity did not occur: Safety/medical concerns   Assist Level: Supervision/Verbal cueing   Wheelchair 150 feet activity     Assist  Wheelchair 150 feet activity did not occur: Safety/medical concerns   Assist Level: Supervision/Verbal cueing   Blood pressure 104/60, pulse 72, temperature 98.6 F (37 C), resp. rate 17, SpO2 98 %.  Medical Problem List and Plan: 1.C7 Quadriplegia ASIA B and functional deficitssecondary to C7-T1 epidural abscess with associated paraplegia -patient maynot yetshower -ELOS/Goals: min assist PT/OT 28 days  12/2- will maintain on low air loss bed until Monday then reassess Stage II pressur eulcer he came with 2. Antithrombotics: -DVT/anticoagulation:Mechanical:Sequential compression devices, below kneeBilateral lower extremities 11/28- will try and get NSU to allow Lovenox due to  high risk of DVT in SCI patients.  11/29- has B/L gastroc DVTs- started Apixiban 10 mg BID x 1 week then 5 mg BID for DVTs.  -antiplatelet therapy: N/A 3. Pain Management:does not like oxycodone due to SE--will change to ultram prn.Encouragedpatient to take muscle relaxers and Tylenol as  needed 4. Mood:LCSW to follow for evaluation and support -antipsychotic agents: N/A 5. Neuropsych: This patientiscapable of making decisions onhisown behalf. 6. Skin/Wound Care:Routine pressure relief measureswith local care to sacrum. [place] RN consult as appropriate.  12/2- will con't low air loss bed unti Monday and then reassess- came with this Stage II to rehab 7. Fluids/Electrolytes/Nutrition:Monitor I's/O. Check lytesin a.m. 8.Acute blood loss anemia: Recheck CBC in a.m.  11/28- Hb 7.8- might cause more problems with orthostatic hypotension- will need abd binder, TEDs, and possibly Midodrine. Will monitor.   11/29- no TEDs for now. No SCDs 9.PAF: Monitor heart rate 3 times daily. Continue metoprolol 25 mg twice daily 10. Neurogenic bladder: We will keep Foley in place for the next 48 to 72 hours until bowel program adjusted  12/1- will remove Foley in AM and start in/out caths- add lidocaine as well 11.Neurogenic bowel: Reports p.o. intake has been poor due to change in taste---has been drinking a lot of smoothies. Discussed dietary needs and changes. Change senna to 2 every morning with suppositories in evening after supper for PM program. Start today. Will check KUB to evaluatestool burdenas reporting abdominal pain since yesterday.  11/28- multiple BMs today- is cleaning self out-  needs Bowel program this evening.  11/29- 2 BMs last night/Overnight-   12/1- didn't get bowel program last night- went over with nursing time for bowel program- evening after dinner. Between day and evening shift, needs to be done.  12/2- got bowel porgram- no results- will monitor closely; also, will get foley out today and start in/out cath program 12. Leucocytosis in setting of Epidural abscess- on IV ABX x 8 weeks: Follow up CBC in am.  11/28- resolved leukocytosis- will monitor labs at least weekly-  121- ESR 82; CRP 11.6- will monitor weekly for now.  13.  Hyperglycemia: Stress Induced hyperglycemia v/s IPG--will check Hgb A1C in am.  11/29- A1c 5.6         LOS: 5 days A FACE TO FACE EVALUATION WAS PERFORMED  Ryan Reed 10/23/2019, 9:35 AM

## 2019-10-23 NOTE — Progress Notes (Signed)
Physical Therapy Session Note  Patient Details  Name: Ryan Reed MRN: [id number] Date of Birth: 03/20/1951  Today's Date: 10/23/2019 PT Individual Time: 0903-1000 1135-1200 PT Individual Time Calculation (min): 57 min 25 min   Short Term Goals: Week 1:  PT Short Term Goal 1 (Week 1): Pt will transfer to and from WC with max assist +2 PT Short Term Goal 2 (Week 1): Pt will propell WC x 100ft with min assist PT Short Term Goal 3 (Week 1): Pt will sit EOB with mod assist up to 5 min  Skilled Therapeutic Interventions/Progress Updates:  Session 1.  Pt received sitting in WC and agreeable to PT. PT assessed BP in sitting, 91/61. PT wrapped BLE with Ace wrap for BP support. Re-assessed 101/61. Pt reports mild improvement in orthostatic s/s.   WC mobility in hall x 50ft with min assist initially for turns progressing to supervision assist.   SB transfers x 3 throughout treatment to mat table, WC, and to return to bed. Mod-max assist +2 for safety SB stability, use of chuck pad under hips, and improved anterior Weight shift.   Sitting balance EOB x 20min to improve neutral sitting balance, improved R and L weight shift with forward reach x 5 BUE. Sustain trunk extension stretch with cues for pt to WB through BLE and activation of serratus anterior and lats.  BP assessed while sitting EOB when pt reports mild dizziness 102/58. S/s reduced after 10 sec.  Pt returned to room and performed SB transfer as listed above. Sit>supine completed with max A  and left supine in bed with call bell in reach and all needs met. BP assessed in semi-reclined 116/51. No s/s of orthostatsis.    Session 2.   Pt received supine in bed and agreeable to PT at bed level. PT instructed pt in AAROM to improve LE flexibility and prevent joint contractures. Hip flexion, Hip IR, hip adduction, and HS each completed with 3 x 30 sec sustained stretch with over pressure into end range, pt reports mild deep joint tightness  noted with hip flexion beyond 90 deg. Rolling R and L x 2 each with mod assist to manage BLE and heavy use of bed rail. In sidelying, pt performed AAROM for hip/knee flexion extension in synergy x 12 BLE. Trace hip flexion noted in the RLE and trace hip extension on the L. Pt left supine with call bell in reach and all needs met.      Therapy Documentation Precautions:  Precautions Precautions: Cervical, Back Restrictions Weight Bearing Restrictions: No    Pain: Pain Assessment Pain Scale: 0-10 Pain Score: 3  Pain Intervention(s): Repositioned    Therapy/Group: Individual Therapy  Austin E Tucker 10/23/2019, 10:16 AM  

## 2019-10-23 NOTE — Progress Notes (Signed)
Occupational Therapy Session Note  Patient Details  Name: Ryan Reed MRN: [id number] Date of Birth: 23-Jul-1951  Today's Date: 10/23/2019 OT Individual Time: 0705-0800 OT Individual Time Calculation (min): 55 min    Short Term Goals: Week 1:  OT Short Term Goal 1 (Week 1): Pt will maintain sitting balance at EOB for 5 minutes with max A of 1 OT Short Term Goal 2 (Week 1): Pt will complete slideboard transfer with max A +2 OT Short Term Goal 3 (Week 1): Pt will complete UB bathing sittting in wc at the sink with min A  Skilled Therapeutic Interventions/Progress Updates:    Upon entering the room, pt supine in bed and agreeable to OT intervention. Pt reports bowel program started but he has been unable to void. Pt rolling L <> R with mod A for therapist to check for incontinence. OT provided total A to don shorts this session from bed level as well. OT changing TED hose to thigh highs secondary to BP of 100/63 from bed level. After donning TEDs, BP increased to 110/65. Maxi move sling placed and +2 transfer for safety to manage equipment while transferring into wheelchair. Pt propelled wheelchair 150' and around obstacles with increased time and multiple stops secondary to fatigue. Pt remained in wheelchair at end of session with breakfast tray placed in front of him and call bell within reach.  Therapy Documentation Precautions:  Precautions Precautions: Cervical, Back Restrictions Weight Bearing Restrictions: No    ADL: ADL Eating: Set up Grooming: Setup Upper Body Bathing: Minimal assistance, Moderate assistance Lower Body Bathing: Maximal assistance, Dependent Upper Body Dressing: Minimal assistance, Moderate assistance Lower Body Dressing: Dependent Toileting: Dependent Toilet Transfer: Unable to assess Tub/Shower Transfer: Unable to assess Vision   Perception    Praxis   Exercises:   Other Treatments:     Therapy/Group: Individual Therapy  Gypsy Decant 10/23/2019, 12:54 PM

## 2019-10-23 NOTE — Progress Notes (Signed)
Dig stimulation performed, no results as of yet. Will inform night shift to complete bowel program.  Romana Juniper

## 2019-10-23 NOTE — Plan of Care (Signed)
  Problem: Education: Goal: Ability to verbalize activity precautions or restrictions will improve Outcome: Progressing Goal: Knowledge of the prescribed therapeutic regimen will improve Outcome: Progressing Goal: Understanding of discharge needs will improve Outcome: Progressing   Problem: Activity: Goal: Ability to avoid complications of mobility impairment will improve Outcome: Progressing Goal: Ability to tolerate increased activity will improve Outcome: Progressing Goal: Will remain free from falls Outcome: Progressing   Problem: Bowel/Gastric: Goal: Gastrointestinal status for postoperative course will improve Outcome: Progressing   Problem: Clinical Measurements: Goal: Ability to maintain clinical measurements within normal limits will improve Outcome: Progressing Goal: Postoperative complications will be avoided or minimized Outcome: Progressing Goal: Diagnostic test results will improve Outcome: Progressing   Problem: Pain Management: Goal: Pain level will decrease Outcome: Progressing   Problem: Skin Integrity: Goal: Will show signs of wound healing Outcome: Progressing   Problem: Health Behavior/Discharge Planning: Goal: Identification of resources available to assist in meeting health care needs will improve Outcome: Progressing   Problem: Bladder/Genitourinary: Goal: Urinary functional status for postoperative course will improve Outcome: Progressing   Problem: Consults Goal: RH SPINAL CORD INJURY PATIENT EDUCATION Description:  See Patient Education module for education specifics.  Outcome: Progressing   Problem: Consults Goal: RH SPINAL CORD INJURY PATIENT EDUCATION Description:  See Patient Education module for education specifics.  Outcome: Progressing   Problem: SCI BOWEL ELIMINATION Goal: RH STG SCI MANAGE BOWEL PROGRAM W/ASSIST OR AS APPROPRIATE Description: STG SCI Manage bowel program w/assist or as appropriate. Mod Outcome: Progressing   Problem: SCI BLADDER ELIMINATION Goal: RH STG SCI MANAGE BLADDER PROGRAM W/ASSISTANCE Description: Patient will manage bladder with mod assist Outcome: Progressing   Problem: RH SKIN INTEGRITY Goal: RH STG MAINTAIN SKIN INTEGRITY WITH ASSISTANCE Description: STG Maintain Skin Integrity With Assistance. Mod Outcome: Progressing Goal: RH STG ABLE TO PERFORM INCISION/WOUND CARE W/ASSISTANCE Description: STG Able To Perform Incision/Wound Care With Assistance. Max Outcome: Progressing

## 2019-10-24 ENCOUNTER — Inpatient Hospital Stay (HOSPITAL_COMMUNITY): Payer: Medicare Other | Admitting: Occupational Therapy

## 2019-10-24 ENCOUNTER — Inpatient Hospital Stay (HOSPITAL_COMMUNITY): Payer: Medicare Other | Admitting: Physical Therapy

## 2019-10-24 DIAGNOSIS — G8250 Quadriplegia, unspecified: Secondary | ICD-10-CM | POA: Diagnosis not present

## 2019-10-24 LAB — BASIC METABOLIC PANEL
Anion gap: 12 (ref 5–15)
BUN: 8 mg/dL (ref 8–23)
CO2: 25 mmol/L (ref 22–32)
Calcium: 8 mg/dL — ABNORMAL LOW (ref 8.9–10.3)
Chloride: 100 mmol/L (ref 98–111)
Creatinine, Ser: 0.51 mg/dL — ABNORMAL LOW (ref 0.61–1.24)
GFR calc Af Amer: >60 mL/min (ref >60)
GFR calc non Af Amer: >60 mL/min (ref >60)
Glucose, Bld: 142 mg/dL — ABNORMAL HIGH (ref 70–99)
Potassium: 3.8 mmol/L (ref 3.5–5.1)
Sodium: 137 mmol/L (ref 135–145)

## 2019-10-24 MED ORDER — POLYETHYLENE GLYCOL 3350 17 G PO PACK
17.0000 g | PACK | Freq: Every day | ORAL | Status: DC
  Administered 2019-10-25 – 2019-11-08 (×14): 17 g via ORAL
  Filled 2019-10-24 (×14): qty 1

## 2019-10-24 MED ORDER — SENNA 8.6 MG PO TABS
3.0000 | ORAL_TABLET | Freq: Every day | ORAL | Status: DC
  Administered 2019-10-25 – 2019-11-03 (×10): 25.8 mg via ORAL
  Filled 2019-10-24 (×10): qty 3

## 2019-10-24 NOTE — Progress Notes (Signed)
Physical Therapy Session Note  Patient Details  Name: Ryan Reed MRN: [id number] Date of Birth: [birth year]  Today's Date: 10/24/2019 PT Individual Time: 0800-0900 PT Individual Time Calculation (min): 60 min   Short Term Goals: Week 1:  PT Short Term Goal 1 (Week 1): Pt will transfer to and from [facility] with max assist +2 PT Short Term Goal 2 (Week 1): Pt will propell WC x 132ft with min assist PT Short Term Goal 3 (Week 1): Pt will sit EOB with mod assist up to 5 min  Skilled Therapeutic Interventions/Progress Updates:    Pt received semi-reclined in bed, agreeable to PT session. No complaints of pain. Dependent to don thigh-high TED hose and ACE wrap for BP management. Semi-reclined BP 108/63 with ACE wraps and TEDs on BLE. Rolling L/R with min A for LE management and use of bedrails for donning pants with max A. Semi-reclined to long-sitting in bed with head of bed elevated, use of BUE on bedrails to obtain position. Pt initially requires max x 2 for trunk control to achieve long-sitting but is able to achieve with min A x 2 by end of session x 5 reps. Provided coban wrap to bedrails for improved grip in order to achieve this position. Discussed importance of long-sitting position for functional tasks such as dressing, LE stretching, etc. Also discussed autonomic dysreflexia signs and symptoms with MD present for rounding, will provide handout to patient with regards to AD. Discussed SCI and how it affects pt's current level of function. Pt very open and receptive to all education. Pt left semi-reclined in bed with needs in reach at end of session.  Therapy Documentation Precautions:  Precautions Precautions: Cervical, Back Restrictions Weight Bearing Restrictions: No    Therapy/Group: Individual Therapy   Excell Seltzer, PT, DPT  10/24/2019, 10:01 AM

## 2019-10-24 NOTE — Progress Notes (Addendum)
Occupational Therapy Session Note  Patient Details  Name: Ryan Reed MRN: [id number] Date of Birth: 05/09/1951  Today's Date: 10/24/2019 OT Individual Time: [id number] and 1400-1500 OT Individual Time Calculation (min): 70 min and 60 min   Short Term Goals: Week 1:  OT Short Term Goal 1 (Week 1): Pt will maintain sitting balance at EOB for 5 minutes with max A of 1 OT Short Term Goal 2 (Week 1): Pt will complete slideboard transfer with max A +2 OT Short Term Goal 3 (Week 1): Pt will complete UB bathing sittting in wc at the sink with min A  Skilled Therapeutic Interventions/Progress Updates:    Session One: Pt seen for OT session focusing on functional sitting balance, mobility and pressure relief with SCI. Pt awake in supine upon arrival, denied pain and agreeable to tx session.  Rolled with min A using hospital bed functions in order for maxi-sky sling to be placed and transferred  To w/c with +2 assist. Completed grooming tasks from w/c level at sink with set-up. In therapy gym, completed sliding board transfer with max A +2. Addressed static and dynamic sitting balance EOM. Pt initially required B UE support and CGA, fading during session to no UE support and close supervision for ~15 seconds trials. VCs for use of head to assist with anterior weight shift and finding balance points. Reclined rest breaks with positioning for chest expansion and pec. Massage.  Sliding board transfer back to w/c with max A +1, +2 to stabilize equipment.  Self propelled w/c back to room mod I. Reviewed importance of pressure relief and pressure relief  Schedule. Provided education and demonstration for techniques of pressure relief in chair, lateral leans onto side of bed. Education provided to pt and then to nursing regarding schedule and technique. Pt going to keep schedule on cell phone. Pt left seated in w/c at end of session, all needs in reach.  Education throughout session regarding activity  progression, SCI, pressure relief and functional implications, and d/c planning.   Session Two: Pt seen for OT session focusing on functional mobility. Pt awake sitting up in w/c upon arrival, voices fatigue but otherwise feeling well and agreeable to tx session. He self propelled w/c throughout unit with supervision. Completed max A sliding board transfer to therapy mat with +2 to stabilize equipment.  Transferred to supine on mat. Addressed rolling while supine on mat, VCs/education for use of momentum and leading with head/shoulder to initiate movement. Pt able to complete x3 rolls to R/L with min A for management of LEs and VCs throughout. Initiated pt to start using leg loops to manage LEs during mobility, however, cont to require some assist. Positioned pt in prone on mat for hip flexor stretch, pt tolerating well. Completed LE stretching/ROM to B LEs, quads, hamstrings, and hip flexors to pt tolerance. Mod-max A with +2 to stabilize equipment for transfer back to chair.  Pt returned to room at end of session, Maxi-sky used to transfer pt back to bed. Pt left in supine with all needs in reach and wife present. Provided pt with tilt in space w/c vs. Reclining w/c in order to facilitate pressure relief up in chair.     Therapy Documentation Precautions:  Precautions Precautions: Cervical, Back Restrictions Weight Bearing Restrictions: No   Therapy/Group: Individual Therapy  Ryan Reed L 10/24/2019, 6:53 AM

## 2019-10-24 NOTE — Care Management (Signed)
[facility] Individual Statement of Services  Patient Name:  Ryan Reed  Date:  10/21/2019  Welcome to the [facility].  Our goal is to provide you with an individualized program based on your diagnosis and situation, designed to meet your specific needs.  With this comprehensive rehabilitation program, you will be expected to participate in at least 3 hours of rehabilitation therapies Monday-Friday, with modified therapy programming on the weekends.  Your rehabilitation program will include the following services:  Physical Therapy (PT), Occupational Therapy (OT), 24 hour per day rehabilitation nursing, Therapeutic Recreaction (TR), Neuropsychology, Case Management (Social Worker), Rehabilitation Medicine, Nutrition Services and Pharmacy Services  Weekly team conferences will be held on Tuesdays to discuss your progress.  Your Social Worker will talk with you frequently to get your input and to update you on team discussions.  Team conferences with you and your family in attendance may also be held.  Expected length of stay: 4 weeks   Overall anticipated outcome: min/ mod assist @ wheelchair  Depending on your progress and recovery, your program may change. Your Social Worker will coordinate services and will keep you informed of any changes. Your Social Worker's name and contact numbers are listed  below.  The following services may also be recommended but are not provided by the [facility] will be made to provide these services after discharge if needed.  Arrangements include referral to agencies that provide these services.  Your insurance has been verified to be:  [facility] [employer] Your primary doctor is:  Melford Aase (new)  Pertinent information will be shared with your doctor and your insurance company.  Social  Worker:  [place], [place] or [phone]   Information discussed with and copy given to patient by: Lennart Pall, 10/21/2019, 3:18 PM

## 2019-10-24 NOTE — Plan of Care (Signed)
  Problem: Education: Goal: Ability to verbalize activity precautions or restrictions will improve Outcome: Progressing Goal: Knowledge of the prescribed therapeutic regimen will improve Outcome: Progressing Goal: Understanding of discharge needs will improve Outcome: Progressing   Problem: Activity: Goal: Ability to avoid complications of mobility impairment will improve Outcome: Progressing Goal: Ability to tolerate increased activity will improve Outcome: Progressing Goal: Will remain free from falls Outcome: Progressing   Problem: Bowel/Gastric: Goal: Gastrointestinal status for postoperative course will improve Outcome: Progressing   Problem: Clinical Measurements: Goal: Ability to maintain clinical measurements within normal limits will improve Outcome: Progressing Goal: Postoperative complications will be avoided or minimized Outcome: Progressing Goal: Diagnostic test results will improve Outcome: Progressing   Problem: Pain Management: Goal: Pain level will decrease Outcome: Progressing   Problem: Skin Integrity: Goal: Will show signs of wound healing Outcome: Progressing   Problem: Health Behavior/Discharge Planning: Goal: Identification of resources available to assist in meeting health care needs will improve Outcome: Progressing   Problem: Bladder/Genitourinary: Goal: Urinary functional status for postoperative course will improve Outcome: Progressing   Problem: Consults Goal: RH SPINAL CORD INJURY PATIENT EDUCATION Description:  See Patient Education module for education specifics.  Outcome: Progressing   Problem: Consults Goal: RH SPINAL CORD INJURY PATIENT EDUCATION Description:  See Patient Education module for education specifics.  Outcome: Progressing   Problem: SCI BOWEL ELIMINATION Goal: RH STG SCI MANAGE BOWEL PROGRAM W/ASSIST OR AS APPROPRIATE Description: STG SCI Manage bowel program w/assist or as appropriate. Mod Outcome: Progressing   Problem: SCI BLADDER ELIMINATION Goal: RH STG SCI MANAGE BLADDER PROGRAM W/ASSISTANCE Description: Patient will manage bladder with mod assist Outcome: Progressing   Problem: RH SKIN INTEGRITY Goal: RH STG MAINTAIN SKIN INTEGRITY WITH ASSISTANCE Description: STG Maintain Skin Integrity With Assistance. Mod Outcome: Progressing Goal: RH STG ABLE TO PERFORM INCISION/WOUND CARE W/ASSISTANCE Description: STG Able To Perform Incision/Wound Care With Assistance. Max Outcome: Progressing

## 2019-10-24 NOTE — Progress Notes (Signed)
Dig stimulation performed, no results as of yet. Will inform night shift to complete bowel program. Ryan Reed

## 2019-10-24 NOTE — Progress Notes (Signed)
[place] PHYSICAL MEDICINE & REHABILITATION PROGRESS NOTE   Subjective/Complaints:  Pt reports had small BM last night with bowel program-  Feels like abdomen is full/a little firm on L side- Per PT< using TEDs/ACE wraps for LEs for orthostatic hypotension- hasn't needed abd binder as of yet-  Discussed Autonomic dysreflexia and what it is, what Sx's pts have and what to expect.  ROS- denies SOB, CP, N/V/D, HA.  Objective:   No results found. No results for input(s): WBC, HGB, HCT, PLT in the last 72 hours. Recent Labs    10/24/19 0536  NA 137  K 3.8  CL 100  CO2 25  GLUCOSE 142*  BUN 8  CREATININE 0.51*  CALCIUM 8.0*    Intake/Output Summary (Last 24 hours) at 10/24/2019 1038 Last data filed at 10/24/2019 0742 Gross per 24 hour  Intake 820 ml  Output 3750 ml  Net -2930 ml     Physical Exam: Vital Signs Blood pressure 103/64, pulse 69, temperature 98.1 F (36.7 C), resp. rate 17, SpO2 100 %.  Physical Exam Nursing noteand vitalsreviewed. Constitutional: awake, alert, appropriate,ssitting up getting ACE wrapped by PT; bed is low pressure air loss bed;NAD  HENT:  Head:Normocephalicand atraumatic.  Right Ear: External earnormal.  Eyes:conjugate gaze Neck:Normal range of motion. CV: RRR  Respiratory:CTA B/L  ZO:[id number] firm on L side more than R side; NT, slightly distended? (+)BS slightly hypoactive Musculoskeletal:  General: No deformity. d Comments: 1+ pedal edema bilaterally Neurological: He is alertand oriented to person, place, and time. Nocranial nerve deficit. Intact sensation in UE. Impaired sensation otherwise below chest. Biceps 5/5, triceps 5/5, WE 5/5; grip 4/5; finger abd 4/5 B/L. LE 0/5 except for perhaps some trace movement in toes. DTR's absent in LE still. Saw a few muscles spasms in LEs without volitional movement Skin: Skin iswarmand dry.No rashnoted. He is not diaphoretic.; still has foley in place  Noerythema. Pressure injury to buttocks and scrotum--blistered area on right buttock and on underside of scrotum.   Assessment/Plan: 1. Functional deficits secondary to C7 quadriplegia/ ASIA B with neurogenic bowel and bladder which require 3+ hours per day of interdisciplinary therapy in a comprehensive inpatient rehab setting.  Physiatrist is providing close team supervision and 24 hour management of active medical problems listed below.  Physiatrist and rehab team continue to assess barriers to discharge/monitor patient progress toward functional and medical goals  Care Tool:  Bathing    Body parts bathed by patient: Right arm, Left arm, Chest, Face, Abdomen(ub only)   Body parts bathed by helper: Buttocks, Right upper leg, Left upper leg, Right lower leg, Left lower leg     Bathing assist Assist Level: Set up assist     Upper Body Dressing/Undressing Upper body dressing   What is the patient wearing?: Pull over shirt    Upper body assist Assist Level: Moderate Assistance - Patient 50 - 74%    Lower Body Dressing/Undressing Lower body dressing      What is the patient wearing?: Pants, Incontinence brief     Lower body assist Assist for lower body dressing: 2 Helpers     Toileting Toileting Toileting Activity did not occur (Clothing management and hygiene only): N/A (no void or bm)  Toileting assist Assist for toileting: 2 Helpers     Transfers Chair/bed transfer  Transfers assist  Chair/bed transfer activity did not occur: Safety/medical concerns  Chair/bed transfer assist level: 2 Helpers     Locomotion Ambulation   Ambulation assist  Ambulation activity did not occur: Safety/medical concerns          Walk 10 feet activity   Assist  Walk 10 feet activity did not occur: Safety/medical concerns        Walk 50 feet activity   Assist Walk 50 feet with 2 turns activity did not occur: Safety/medical concerns         Walk 150 feet  activity   Assist Walk 150 feet activity did not occur: Safety/medical concerns         Walk 10 feet on uneven surface  activity   Assist Walk 10 feet on uneven surfaces activity did not occur: Safety/medical concerns         Wheelchair     Assist Will patient use wheelchair at discharge?: Yes Type of Wheelchair: Manual Wheelchair activity did not occur: Safety/medical concerns  Wheelchair assist level: Supervision/Verbal cueing Max wheelchair distance: 50    Wheelchair 50 feet with 2 turns activity    Assist    Wheelchair 50 feet with 2 turns activity did not occur: Safety/medical concerns   Assist Level: Supervision/Verbal cueing   Wheelchair 150 feet activity     Assist  Wheelchair 150 feet activity did not occur: Safety/medical concerns   Assist Level: Supervision/Verbal cueing   Blood pressure 103/64, pulse 69, temperature 98.1 F (36.7 C), resp. rate 17, SpO2 100 %.  Medical Problem List and Plan: 1.C7 Quadriplegia ASIA B and functional deficitssecondary to C7-T1 epidural abscess with associated paraplegia -patient maynot yetshower -ELOS/Goals: min assist PT/OT 28 days  12/2- will maintain on low air loss bed until Monday then reassess Stage II pressure ulcer he came with  12/3- PT doing ACE wraps/TEDs for orthostatic hypotension related to SCI- not using Abd binder 2. Antithrombotics: -DVT/anticoagulation:Eliquis 11/28- will try and get NSU to allow Lovenox due to  high risk of DVT in SCI patients.  11/29- has B/L gastroc DVTs- started Apixiban 10 mg BID x 1 week then 5 mg BID for DVTs.  -antiplatelet therapy: N/A 3. Pain Management:does not like oxycodone due to SE--will change to ultram prn.Encouragedpatient to take muscle relaxers and Tylenol as needed 4. Mood:LCSW to follow for evaluation and support -antipsychotic agents: N/A 5. Neuropsych: This patientiscapable of making  decisions onhisown behalf. 6. Skin/Wound Care:Routine pressure relief measureswith local care to sacrum. [place] RN consult as appropriate.  12/2- will con't low air loss bed unti Monday and then reassess- came with this Stage II to rehab 7. Fluids/Electrolytes/Nutrition:Monitor I's/O. Check lytesin a.m. 8.Acute blood loss anemia: Recheck CBC in a.m.  11/28- Hb 7.8- might cause more problems with orthostatic hypotension- will need abd binder, TEDs, and possibly Midodrine. Will monitor.   11/29- no SCDs due to DVTs 9.PAF: Monitor heart rate 3 times daily. Continue metoprolol 25 mg twice daily 10. Neurogenic bladder: We will keep Foley in place for the next 48 to 72 hours until bowel program adjusted  12/1- will remove Foley in AM and start in/out caths- add lidocaine as well 11.Neurogenic bowel: Reports p.o. intake has been poor due to change in taste---has been drinking a lot of smoothies. Discussed dietary needs and changes. Change senna to 2 every morning with suppositories in evening after supper for PM program. Start today. Will check KUB to evaluatestool burdenas reporting abdominal pain since yesterday.  11/28- multiple BMs today- is cleaning self out- needs Bowel program this evening.  11/29- 2 BMs last night/Overnight-   12/1- didn't get bowel program last night- went over with  nursing time for bowel program- evening after dinner. Between day and evening shift, needs to be done.  12/2- got bowel program- no results- will monitor closely; also, will get foley out today and start in/out cath program  12/3- will increase bowel meds today- if doesn't have BM tonight, will do mg citrate/enema tomorrow- added miralax and increased senokot to 3 tabs in AM 12. Leucocytosis in setting of Epidural abscess- on IV ABX x 8 weeks: Follow up CBC in am.  11/28- resolved leukocytosis- will monitor labs at least weekly-  121- ESR 82; CRP 11.6- will monitor weekly for now.  13.  Hyperglycemia: Stress Induced hyperglycemia v/s IPG--will check Hgb A1C in am.  11/29- A1c 5.6         LOS: 6 days A FACE TO FACE EVALUATION WAS PERFORMED  Cristiana Yochim 10/24/2019, 10:38 AM

## 2019-10-25 ENCOUNTER — Inpatient Hospital Stay (HOSPITAL_COMMUNITY): Payer: Medicare Other

## 2019-10-25 ENCOUNTER — Inpatient Hospital Stay (HOSPITAL_COMMUNITY): Payer: Medicare Other | Admitting: Occupational Therapy

## 2019-10-25 ENCOUNTER — Inpatient Hospital Stay (HOSPITAL_COMMUNITY): Payer: Medicare Other | Admitting: Physical Therapy

## 2019-10-25 DIAGNOSIS — G8250 Quadriplegia, unspecified: Secondary | ICD-10-CM | POA: Diagnosis not present

## 2019-10-25 MED ORDER — MAGNESIUM CITRATE PO SOLN
1.0000 | Freq: Once | ORAL | Status: AC
  Administered 2019-10-25: 1 via ORAL
  Filled 2019-10-25: qty 296

## 2019-10-25 NOTE — Plan of Care (Signed)
  Problem: Consults Goal: RH SPINAL CORD INJURY PATIENT EDUCATION Description:  See Patient Education module for education specifics.  Outcome: Progressing   Problem: SCI BOWEL ELIMINATION Goal: RH STG SCI MANAGE BOWEL PROGRAM W/ASSIST OR AS APPROPRIATE Description: STG SCI Manage bowel program w/assist or as appropriate. Mod Outcome: Progressing   Problem: SCI BLADDER ELIMINATION Goal: RH STG SCI MANAGE BLADDER PROGRAM W/ASSISTANCE Description: Patient will manage bladder with mod assist Outcome: Progressing   Problem: RH SKIN INTEGRITY Goal: RH STG MAINTAIN SKIN INTEGRITY WITH ASSISTANCE Description: STG Maintain Skin Integrity With Assistance. Mod Outcome: Progressing Goal: RH STG ABLE TO PERFORM INCISION/WOUND CARE W/ASSISTANCE Description: STG Able To Perform Incision/Wound Care With Assistance. Max Outcome: Progressing

## 2019-10-25 NOTE — Progress Notes (Signed)
Dig stim performed. No results at present. Will pass along to night shift to perform 1 more dig stim.   Erie Noe, RN

## 2019-10-25 NOTE — Progress Notes (Signed)
Physical Therapy Weekly Progress Note  Patient Details  Name: Ryan Reed MRN: [id number] Date of Birth: 16-Nov-1951  Beginning of progress report period: October 18, 2019 End of progress report period: October 25, 2019  Today's Date: 10/25/2019 PT Individual Time: 0900-1010 PT Individual Time Calculation (min): 70 min   Patient has met 3 of 3 short term goals.  Pt is currently min A for rolling, assist x 2 for supine to/from sit, dependent for transfers via [facility] or is able to perform slide board transfers with max A x 2, and is at Supervision level for w/c mobility x 100-150 ft with use of BUE.  Patient continues to demonstrate the following deficits muscle weakness, muscle joint tightness and muscle paralysis, abnormal tone and unbalanced muscle activation and decreased sitting balance, decreased standing balance, decreased postural control and decreased balance strategies and therefore will continue to benefit from skilled PT intervention to increase functional independence with mobility.  Patient progressing toward long term goals..  Continue plan of care.  PT Short Term Goals Week 1:  PT Short Term Goal 1 (Week 1): Pt will transfer to and from [facility] with max assist +2 PT Short Term Goal 1 - Progress (Week 1): Met PT Short Term Goal 2 (Week 1): Pt will propell WC x 149f with min assist PT Short Term Goal 2 - Progress (Week 1): Met PT Short Term Goal 3 (Week 1): Pt will sit EOB with mod assist up to 5 min PT Short Term Goal 3 - Progress (Week 1): Met Week 2:  PT Short Term Goal 1 (Week 2): Pt will recall 3 pressure relief techniques and pressure relief schedule PT Short Term Goal 2 (Week 2): Pt will perform bed mobility with assist x 1 PT Short Term Goal 3 (Week 2): Pt will maintain sitting balance with min A x 5 min  Skilled Therapeutic Interventions/Progress Updates:    Pt received seated in bed, agreeable to PT session. Pt reports abdominal discomfort due to ongoing issues  with bowel, RN to address later this AM. Rolling L/R with min A and use of bedrail for dependent placement of maxi sky sling. Maxisky transfer to tilt table. Pt with thigh high TEDS and ACE wraps to BLE in place. See transition below from supine on tilt table up to 50 degrees on tilt table. Pt remains asymptomatic during positioning on tilt table but does have decrease in BP initially at 30 degrees that remains steady until he goes up to 50 degrees. Pt returned to 40 degrees after decrease in BP at 50 degrees. Pt then transferred back to bed via use of maxisky. Use of tilt table for WBing through BLE in order to facilitate muscle stretching, bone density, and upright tolerance. Also provided handouts to patient with regards to autonomic dysreflexia and PROM stretching program. Will continue to review education as appropriate during patient's stay on rehab. Pt left semi-reclined in bed with needs in reach at end of session.  Supine BP 108/59 30 degrees BP 92/55 (67), HR 62 35 degrees BP 93/54 (66), HR 63 40 degrees BP 93/52 (65), HR 61 50 degrees BP 85/52 (63), HR 60, asymptomatic 40 degrees BP 92/58 (70), HR 59 Supine BP 117/66 (81), HR 65  Therapy Documentation Precautions:  Precautions Precautions: Cervical, Back Restrictions Weight Bearing Restrictions: No   Therapy/Group: Individual Therapy   TExcell Seltzer PT, DPT 10/25/2019, 12:38 PM

## 2019-10-25 NOTE — Progress Notes (Signed)
Mg Citrate and tap water enema administered. Pt had medium loose BM after the enema. Peri care was performed. Pt resting in bed. Says still feels like more to come out. Will cont to monitor.   Erie Noe, RN

## 2019-10-25 NOTE — Progress Notes (Signed)
[place] PHYSICAL MEDICINE & REHABILITATION PROGRESS NOTE   Subjective/Complaints:  Pt reports Feeling full/tight abdomen due to lack of BM.  Feels bloated.  ROS- denies SOB, CP, N/V/D, HA. Has abd pain Objective:   No results found. No results for input(s): WBC, HGB, HCT, PLT in the last 72 hours. Recent Labs    10/24/19 0536  NA 137  K 3.8  CL 100  CO2 25  GLUCOSE 142*  BUN 8  CREATININE 0.51*  CALCIUM 8.0*    Intake/Output Summary (Last 24 hours) at 10/25/2019 0851 Last data filed at 10/25/2019 0605 Gross per 24 hour  Intake 240 ml  Output 3350 ml  Net -3110 ml     Physical Exam: Vital Signs Blood pressure 92/64, pulse 71, temperature 98.1 F (36.7 C), temperature source Oral, resp. rate 18, SpO2 100 %.  Physical Exam Nursing noteand vitalsreviewed. Constitutional: awake, alert, appropriate,lying in bed in hoyer sling getting transferred by OT; bed is low pressure air loss bed;NAD  HENT:  Head:Normocephalicand atraumatic.  Right Ear: External earnormal.  Eyes:conjugate gaze Neck:Normal range of motion. CV: RRR  Respiratory:CTA B/L  HB:ZJIR abdomen; sl;ightly tight, TTP; distended; slightly hypoactive Musculoskeletal:  General: No deformity. d Comments: 1+ pedal edema bilaterally Neurological: He is alertand oriented to person, place, and time. Nocranial nerve deficit. Intact sensation in UE. Impaired sensation otherwise below chest. Biceps 5/5, triceps 5/5, WE 5/5; grip 4/5; finger abd 4/5 B/L. LE 0/5 except for perhaps some trace movement in toes. DTR's absent in LE still. Saw a few muscles spasms in LEs without volitional movement Skin: Skin iswarmand dry.No rashnoted. He is not diaphoretic.; still has foley in place Noerythema. Pressure injury to buttocks and scrotum--blistered area on right buttock and on underside of scrotum.   Assessment/Plan: 1. Functional deficits secondary to C7 quadriplegia/ ASIA B with neurogenic  bowel and bladder which require 3+ hours per day of interdisciplinary therapy in a comprehensive inpatient rehab setting.  Physiatrist is providing close team supervision and 24 hour management of active medical problems listed below.  Physiatrist and rehab team continue to assess barriers to discharge/monitor patient progress toward functional and medical goals  Care Tool:  Bathing    Body parts bathed by patient: Right arm, Left arm, Chest, Face, Abdomen(ub only)   Body parts bathed by helper: Buttocks, Right upper leg, Left upper leg, Right lower leg, Left lower leg     Bathing assist Assist Level: Set up assist     Upper Body Dressing/Undressing Upper body dressing   What is the patient wearing?: Pull over shirt    Upper body assist Assist Level: Moderate Assistance - Patient 50 - 74%    Lower Body Dressing/Undressing Lower body dressing      What is the patient wearing?: Pants, Incontinence brief     Lower body assist Assist for lower body dressing: 2 Helpers     Toileting Toileting Toileting Activity did not occur (Clothing management and hygiene only): N/A (no void or bm)  Toileting assist Assist for toileting: 2 Helpers     Transfers Chair/bed transfer  Transfers assist  Chair/bed transfer activity did not occur: Safety/medical concerns  Chair/bed transfer assist level: 2 Helpers     Locomotion Ambulation   Ambulation assist   Ambulation activity did not occur: Safety/medical concerns          Walk 10 feet activity   Assist  Walk 10 feet activity did not occur: Safety/medical concerns  Walk 50 feet activity   Assist Walk 50 feet with 2 turns activity did not occur: Safety/medical concerns         Walk 150 feet activity   Assist Walk 150 feet activity did not occur: Safety/medical concerns         Walk 10 feet on uneven surface  activity   Assist Walk 10 feet on uneven surfaces activity did not occur:  Safety/medical concerns         Wheelchair     Assist Will patient use wheelchair at discharge?: Yes Type of Wheelchair: Manual Wheelchair activity did not occur: Safety/medical concerns  Wheelchair assist level: Supervision/Verbal cueing Max wheelchair distance: 50    Wheelchair 50 feet with 2 turns activity    Assist    Wheelchair 50 feet with 2 turns activity did not occur: Safety/medical concerns   Assist Level: Supervision/Verbal cueing   Wheelchair 150 feet activity     Assist  Wheelchair 150 feet activity did not occur: Safety/medical concerns   Assist Level: Supervision/Verbal cueing   Blood pressure 92/64, pulse 71, temperature 98.1 F (36.7 C), temperature source Oral, resp. rate 18, SpO2 100 %.  Medical Problem List and Plan: 1.C7 Quadriplegia ASIA B and functional deficitssecondary to C7-T1 epidural abscess with associated paraplegia -patient maynot yetshower -ELOS/Goals: min assist PT/OT 28 days  12/2- will maintain on low air loss bed until Monday then reassess Stage II pressure ulcer he came with  12/3- PT doing ACE wraps/TEDs for orthostatic hypotension related to SCI- not using Abd binder 2. Antithrombotics: -DVT/anticoagulation:Eliquis 11/28- will try and get NSU to allow Lovenox due to  high risk of DVT in SCI patients.  11/29- has B/L gastroc DVTs- started Apixiban 10 mg BID x 1 week then 5 mg BID for DVTs.  -antiplatelet therapy: N/A 3. Pain Management:does not like oxycodone due to SE--will change to ultram prn.Encouragedpatient to take muscle relaxers and Tylenol as needed 4. Mood:LCSW to follow for evaluation and support -antipsychotic agents: N/A 5. Neuropsych: This patientiscapable of making decisions onhisown behalf. 6. Skin/Wound Care:Routine pressure relief measureswith local care to sacrum. [place] RN consult as appropriate.  12/2- will con't low air loss bed unti  Monday and then reassess- came with this Stage II to rehab 7. Fluids/Electrolytes/Nutrition:Monitor I's/O. Check lytesin a.m. 8.Acute blood loss anemia: Recheck CBC in a.m.  11/28- Hb 7.8- might cause more problems with orthostatic hypotension- will need abd binder, TEDs, and possibly Midodrine. Will monitor.   11/29- no SCDs due to DVTs 9.PAF: Monitor heart rate 3 times daily. Continue metoprolol 25 mg twice daily 10. Neurogenic bladder: We will keep Foley in place for the next 48 to 72 hours until bowel program adjusted  12/1- will remove Foley in AM and start in/out caths- add lidocaine as well 11.Neurogenic bowel: Reports p.o. intake has been poor due to change in taste---has been drinking a lot of smoothies. Discussed dietary needs and changes. Change senna to 2 every morning with suppositories in evening after supper for PM program. Start today. Will check KUB to evaluatestool burdenas reporting abdominal pain since yesterday.  11/28- multiple BMs today- is cleaning self out- needs Bowel program this evening.  11/29- 2 BMs last night/Overnight-   12/1- didn't get bowel program last night- went over with nursing time for bowel program- evening after dinner. Between day and evening shift, needs to be done.  12/2- got bowel program- no results- will monitor closely; also, will get foley out today and start in/out cath  program  12/3- will increase bowel meds today- if doesn't have BM tonight, will do mg citrate/enema tomorrow- added miralax and increased senokot to 3 tabs in AM  12/4- ordered mg citrate and in 3 hours do tap water enema- should hopefully work.  12. Leucocytosis in setting of Epidural abscess- on IV ABX x 8 weeks: Follow up CBC in am.  11/28- resolved leukocytosis- will monitor labs at least weekly-  121- ESR 82; CRP 11.6- will monitor weekly for now.  13. Hyperglycemia: Stress Induced hyperglycemia v/s IPG--will check Hgb A1C in am.  11/29- A1c 5.6          LOS: 7 days A FACE TO FACE EVALUATION WAS PERFORMED  Ryan Reed 10/25/2019, 8:51 AM

## 2019-10-25 NOTE — Progress Notes (Signed)
Occupational Therapy Weekly Progress Note  Patient Details  Name: Ryan Reed MRN: [id number] Date of Birth: [birth year]  Beginning of progress report period: October 19, 2019 End of progress report period: October 25, 2019  Today's Date: 10/25/2019 OT Individual Time: 9021-1155 OT Individual Time Calculation (min): 60 min    Patient has met 3 of 3 short term goals.  Pt is making steady progress towards OT goals. He is completing sliding board transfers with max A with +2 required for safety and to stabile equipment. He requires use of TED hose and ACE wraps for BP management.  CGA-min A for static sitting balance for 10-15 seconds increments on EOM. Total A bed level toileting for in/out cathing and bowel program.  SCI education has been on-going. Pt's wife present intermittently during week and when she is present education provided to her as well. Pt remains very motivated and hard working in tx sessions, very god understanding of his current deficits and functional implications.   Patient continues to demonstrate the following deficits: muscle weakness and muscle paralysis, decreased cardiorespiratoy endurance and decreased sitting balance, decreased postural control and decreased balance strategies and therefore will continue to benefit from skilled OT intervention to enhance overall performance with BADL and Reduce care partner burden.  Patient progressing toward long term goals..  Continue plan of care.  OT Short Term Goals Week 1:  OT Short Term Goal 1 (Week 1): Pt will maintain sitting balance at EOB for 5 minutes with max A of 1 OT Short Term Goal 1 - Progress (Week 1): Met OT Short Term Goal 2 (Week 1): Pt will complete slideboard transfer with max A +2 OT Short Term Goal 2 - Progress (Week 1): Met OT Short Term Goal 3 (Week 1): Pt will complete UB bathing sittting in wc at the sink with min A OT Short Term Goal 3 - Progress (Week 1): Met Week 2:  OT Short Term Goal 1  (Week 2): Pt will dress LB with set-up bed level using AE PRN OT Short Term Goal 2 (Week 2): Pt will direct caregiver with set-up and technique of sliding board transfer independently OT Short Term Goal 3 (Week 2): Pt will consistently complete sliding board transfers with +1 assist in order to reduce caregiver burden OT Short Term Goal 4 (Week 2): Pt will be independent in directing care and initiating pressure relief schedule  Skilled Therapeutic Interventions/Progress Updates:    Pt seen for OT session focusing on ADL re-training. Pt awake in supine upon arrival with RN present providing AM meds. Pt agreeable to tx session. Complaints of constipation which RN and MD are aware of with plans for medication later in the day to facilitate BM.  Completed LB bathing/dressing from bed level in supported semi-circle sit position. Pt able to manage R LE using B UEs in order to position LE to doff sock and wash R LE. Min A required for positioning of L LE as L hip flexor more tight vs. R limiting ROM.  He threaded B LEs into pants with min A. Bed flattened and pt rolled with min A using bed rails and able to pull pants up from side-lying position on R/L. MaxiSky sling place and pt transferred total A +2 in lift to tilt-in-space w/c. Pt left seated in w/c at end of session, all needs in reach. Reviewed pressure relief schedule, pt voiced understanding.   Therapy Documentation Precautions:  Precautions Precautions: Cervical, Back Restrictions Weight Bearing Restrictions: No  Therapy/Group: Individual Therapy  Sofia Vanmeter L 10/25/2019, 6:53 AM

## 2019-10-25 NOTE — Progress Notes (Signed)
Occupational Therapy Note  Patient Details  Name: Ryan Reed MRN: [id number] Date of Birth: 1951/05/02   Pt missed 60 min skilled OT d/t pain from need to have BM. Pt politely declines tx but willing to work at later time if BM is successful. Will follow up per POC.    Lowella Dell Natane Heward 10/25/2019, 1:09 PM

## 2019-10-25 NOTE — Progress Notes (Signed)
Bowel program performed. No bowel movement post digital stimulation.

## 2019-10-26 DIAGNOSIS — G8250 Quadriplegia, unspecified: Secondary | ICD-10-CM | POA: Diagnosis not present

## 2019-10-26 NOTE — Progress Notes (Signed)
[place] PHYSICAL MEDICINE & REHABILITATION PROGRESS NOTE   Subjective/Complaints:  Patient states that his last cath volume was about 500 mL.  He still has poor sensation for bowel movements. He does not have any significant pain complaints today.  ROS- denies SOB, CP, N/V/D, HA. Has abd pain Objective:   No results found. No results for input(s): WBC, HGB, HCT, PLT in the last 72 hours. Recent Labs    10/24/19 0536  NA 137  K 3.8  CL 100  CO2 25  GLUCOSE 142*  BUN 8  CREATININE 0.51*  CALCIUM 8.0*    Intake/Output Summary (Last 24 hours) at 10/26/2019 1119 Last data filed at 10/26/2019 1020 Gross per 24 hour  Intake 370 ml  Output 5300 ml  Net -4930 ml     Physical Exam: Vital Signs Blood pressure (!) 105/56, pulse 66, temperature 98.1 F (36.7 C), resp. rate 18, SpO2 100 %.  Physical Exam Nursing noteand vitalsreviewed. Constitutional: awake, alert, appropriate,lying in bed in hoyer sling getting transferred by OT; bed is low pressure air loss bed;NAD  HENT:  Head:Normocephalicand atraumatic.  Right Ear: External earnormal.  Eyes:conjugate gaze Neck:Normal range of motion. CV: RRR  Respiratory:CTA B/L  FY:BOFB abdomen; sl;ightly tight, TTP; distended; slightly hypoactive Musculoskeletal:  General: No deformity. d Comments: 1+ pedal edema bilaterally Neurological: He is alertand oriented to person, place, and time. Nocranial nerve deficit. Intact sensation in UE. Impaired sensation otherwise below chest. Biceps 5/5, triceps 5/5, WE 5/5; grip 4/5; finger abd 4/5 B/L. LE 0/5 except for perhaps some trace movement in toes. DTR's absent in LE still. Saw a few muscles spasms in LEs without volitional movement Skin: Skin iswarmand dry.No rashnoted. He is not diaphoretic.; still has foley in place Noerythema. Pressure injury to buttocks and scrotum--blistered area on right buttock and on underside of scrotum.   Assessment/Plan: 1.  Functional deficits secondary to C7 quadriplegia/ ASIA B with neurogenic bowel and bladder which require 3+ hours per day of interdisciplinary therapy in a comprehensive inpatient rehab setting.  Physiatrist is providing close team supervision and 24 hour management of active medical problems listed below.  Physiatrist and rehab team continue to assess barriers to discharge/monitor patient progress toward functional and medical goals  Care Tool:  Bathing    Body parts bathed by patient: Right arm, Left arm, Chest, Face, Abdomen(ub only)   Body parts bathed by helper: Right arm, Left arm, Chest, Abdomen, Right upper leg, Left upper leg, Right lower leg, Left lower leg, Face Body parts n/a: Front perineal area, Buttocks   Bathing assist Assist Level: Minimal Assistance - Patient > 75%     Upper Body Dressing/Undressing Upper body dressing   What is the patient wearing?: Pull over shirt    Upper body assist Assist Level: Moderate Assistance - Patient 50 - 74%    Lower Body Dressing/Undressing Lower body dressing      What is the patient wearing?: Pants     Lower body assist Assist for lower body dressing: Minimal Assistance - Patient > 75%     Toileting Toileting Toileting Activity did not occur (Clothing management and hygiene only): N/A (no void or bm)  Toileting assist Assist for toileting: 2 Helpers     Transfers Chair/bed transfer  Transfers assist  Chair/bed transfer activity did not occur: Safety/medical concerns  Chair/bed transfer assist level: Dependent - mechanical lift     Locomotion Ambulation   Ambulation assist   Ambulation activity did not occur: Safety/medical concerns  Walk 10 feet activity   Assist  Walk 10 feet activity did not occur: Safety/medical concerns        Walk 50 feet activity   Assist Walk 50 feet with 2 turns activity did not occur: Safety/medical concerns         Walk 150 feet activity   Assist  Walk 150 feet activity did not occur: Safety/medical concerns         Walk 10 feet on uneven surface  activity   Assist Walk 10 feet on uneven surfaces activity did not occur: Safety/medical concerns         Wheelchair     Assist Will patient use wheelchair at discharge?: Yes Type of Wheelchair: Manual Wheelchair activity did not occur: Safety/medical concerns  Wheelchair assist level: Supervision/Verbal cueing Max wheelchair distance: 50    Wheelchair 50 feet with 2 turns activity    Assist    Wheelchair 50 feet with 2 turns activity did not occur: Safety/medical concerns   Assist Level: Supervision/Verbal cueing   Wheelchair 150 feet activity     Assist  Wheelchair 150 feet activity did not occur: Safety/medical concerns   Assist Level: Supervision/Verbal cueing   Blood pressure (!) 105/56, pulse 66, temperature 98.1 F (36.7 C), resp. rate 18, SpO2 100 %.  Medical Problem List and Plan: 1.C7 Quadriplegia ASIA B and functional deficitssecondary to C7-T1 epidural abscess with associated paraplegia -patient maynot yetshower -ELOS/Goals: min assist PT/OT 28 days  12/2- will maintain on low air loss bed until Monday then reassess Stage II pressure ulcer he came with  12/3- PT doing ACE wraps/TEDs for orthostatic hypotension related to SCI- not using Abd binder 2. Antithrombotics: -DVT/anticoagulation:Eliquis 11/28- will try and get NSU to allow Lovenox due to  high risk of DVT in SCI patients.  11/29- has B/L gastroc DVTs- started Apixiban 10 mg BID x 1 week then 5 mg BID for DVTs.  -antiplatelet therapy: N/A 3. Pain Management:does not like oxycodone due to SE--will change to ultram prn.Encouragedpatient to take muscle relaxers and Tylenol as needed 4. Mood:LCSW to follow for evaluation and support -antipsychotic agents: N/A 5. Neuropsych: This patientiscapable of making decisions  onhisown behalf. 6. Skin/Wound Care:Routine pressure relief measureswith local care to sacrum. [place] RN consult as appropriate.  12/2- will con't low air loss bed unti Monday and then reassess- came with this Stage II to rehab 7. Fluids/Electrolytes/Nutrition:Monitor I's/O. Check lytesin a.m. 8.Acute blood loss anemia: Recheck CBC in a.m.  11/28- Hb 7.8- might cause more problems with orthostatic hypotension- will need abd binder, TEDs, and possibly Midodrine. Will monitor.   11/29- no SCDs due to DVTs 9.PAF: Monitor heart rate 3 times daily. Continue metoprolol 25 mg twice daily 10. Neurogenic bladder: We will keep Foley in place for the next 48 to 72 hours until bowel program adjusted  12/1- will remove Foley in AM and start in/out caths- add lidocaine as well ICP volumes 600-738m- Nsg will need to increase freq to Q4h per orders 11.Neurogenic bowel: Reports p.o. intake has been poor due to change in taste---has been drinking a lot of smoothies. Discussed dietary needs and changes. Change senna to 2 every morning with suppositories in evening after supper for PM program. Start today. Will check KUB to evaluatestool burdenas reporting abdominal pain since yesterday.  11/28- multiple BMs today- is cleaning self out- needs Bowel program this evening.  11/29- 2 BMs last night/Overnight-   12/1- didn't get bowel program last night- went over with nursing time  for bowel program- evening after dinner. Between day and evening shift, needs to be done.  12/2- got bowel program- no results- will monitor closely; also, will get foley out today and start in/out cath program  12/3- will increase bowel meds today- if doesn't have BM tonight, will do mg citrate/enema tomorrow- added miralax and increased senokot to 3 tabs in AM  12/4- ordered mg citrate and in 3 hours do tap water enema- should hopefully work.  12/5 incont mushy BM- small  12. Leucocytosis in setting of Epidural abscess- on  IV ABX x 8 weeks: Follow up CBC in am.  11/28- resolved leukocytosis- will monitor labs at least weekly-  121- ESR 82; CRP 11.6- will monitor weekly for now.  13. Hyperglycemia: Stress Induced hyperglycemia v/s IPG--will check Hgb A1C in am.  11/29- A1c 5.6         LOS: 8 days A FACE TO FACE EVALUATION WAS PERFORMED  Charlett Blake 10/26/2019, 11:19 AM

## 2019-10-26 NOTE — Plan of Care (Signed)
  Problem: Education: Goal: Ability to verbalize activity precautions or restrictions will improve Outcome: Progressing Goal: Knowledge of the prescribed therapeutic regimen will improve Outcome: Progressing Goal: Understanding of discharge needs will improve Outcome: Progressing   Problem: Activity: Goal: Ability to avoid complications of mobility impairment will improve Outcome: Progressing Goal: Ability to tolerate increased activity will improve Outcome: Progressing Goal: Will remain free from falls Outcome: Progressing   Problem: Bowel/Gastric: Goal: Gastrointestinal status for postoperative course will improve Outcome: Progressing   Problem: Clinical Measurements: Goal: Ability to maintain clinical measurements within normal limits will improve Outcome: Progressing Goal: Postoperative complications will be avoided or minimized Outcome: Progressing Goal: Diagnostic test results will improve Outcome: Progressing   Problem: Pain Management: Goal: Pain level will decrease Outcome: Progressing   Problem: Skin Integrity: Goal: Will show signs of wound healing Outcome: Progressing   Problem: Health Behavior/Discharge Planning: Goal: Identification of resources available to assist in meeting health care needs will improve Outcome: Progressing   Problem: Bladder/Genitourinary: Goal: Urinary functional status for postoperative course will improve Outcome: Progressing   Problem: Consults Goal: RH SPINAL CORD INJURY PATIENT EDUCATION Description:  See Patient Education module for education specifics.  Outcome: Progressing   Problem: Consults Goal: RH SPINAL CORD INJURY PATIENT EDUCATION Description:  See Patient Education module for education specifics.  Outcome: Progressing   Problem: SCI BOWEL ELIMINATION Goal: RH STG SCI MANAGE BOWEL PROGRAM W/ASSIST OR AS APPROPRIATE Description: STG SCI Manage bowel program w/assist or as appropriate. Mod Outcome: Progressing   Problem: SCI BLADDER ELIMINATION Goal: RH STG SCI MANAGE BLADDER PROGRAM W/ASSISTANCE Description: Patient will manage bladder with mod assist Outcome: Progressing   Problem: RH SKIN INTEGRITY Goal: RH STG MAINTAIN SKIN INTEGRITY WITH ASSISTANCE Description: STG Maintain Skin Integrity With Assistance. Mod Outcome: Progressing Goal: RH STG ABLE TO PERFORM INCISION/WOUND CARE W/ASSISTANCE Description: STG Able To Perform Incision/Wound Care With Assistance. Max Outcome: Progressing

## 2019-10-27 ENCOUNTER — Inpatient Hospital Stay (HOSPITAL_COMMUNITY): Payer: Medicare Other | Admitting: Occupational Therapy

## 2019-10-27 ENCOUNTER — Inpatient Hospital Stay (HOSPITAL_COMMUNITY): Payer: Medicare Other | Admitting: Physical Therapy

## 2019-10-27 DIAGNOSIS — G8250 Quadriplegia, unspecified: Secondary | ICD-10-CM | POA: Diagnosis not present

## 2019-10-27 MED ORDER — FERROUS SULFATE 325 (65 FE) MG PO TABS
325.0000 mg | ORAL_TABLET | Freq: Two times a day (BID) | ORAL | Status: DC
  Administered 2019-10-27 – 2019-11-13 (×33): 325 mg via ORAL
  Filled 2019-10-27 (×34): qty 1

## 2019-10-27 NOTE — Progress Notes (Signed)
Physical Therapy Session Note  Patient Details  Name: Ryan Reed MRN: [id number] Date of Birth: 14-Aug-1951  Today's Date: 10/27/2019 PT Individual Time: [id number] PT Individual Time Calculation (min): 57 min   Short Term Goals: Week 2:  PT Short Term Goal 1 (Week 2): Pt will recall 3 pressure relief techniques and pressure relief schedule PT Short Term Goal 2 (Week 2): Pt will perform bed mobility with assist x 1 PT Short Term Goal 3 (Week 2): Pt will maintain sitting balance with min A x 5 min  Skilled Therapeutic Interventions/Progress Updates:   Pt in supine and agreeable to therapy, denies pain. B thigh-high TEDs and ACE wraps already donned. R/L rolling w/ max assist for BLE placement, donned shorts in supine total assist to thread but min assist to pull over hips in sidelying. Max[place]y transfer to [place]. Total assist w/c transport to therapy gym. Performed passive BLE stretching into knee extension and ankle DF, 30 sec x2. Kinetron 1 min x3 @ level 90 cm/sec to work on BLE muscle activation and activation of central pattern generator. Total assist needed from therapist. Pt w/ palpable contraction knee and hip movement in RLE during this. Gait belt around B knees to assist w/ maintaining neutral alignment. Performed slide board transfer to edge of mat w/ max assist +1. Pt performed all scooting w/o much physical assist, however needed max assist for dynamic trunk balance and tactile/verbal cues for BUE placement. Supine rest on wedge prior to returning to static sitting. Pt min-mod assist for static sitting balance, max assist for dynamic. Therapist supporting pt on therapy ball posteriorly while pt tossed ball w/ 2nd helper using [place]. Tactile cues for upright posture and to achieve neutral lumbothoracic alignment. Pt limited by mild dizziness at end. Returned to reclining on wedge and provided w/ sips of water - dizziness then resolved. Max assist +2 slide board back to w/c, 2nd helper  only there for IV and w/c management, otherwise +1 assist. Pt self-propelled w/c back to room, per his request. Ended session tilted in TIS, all needs in reach.   Therapy Documentation Precautions:  Precautions Precautions: Cervical, Back Restrictions Weight Bearing Restrictions: No  Therapy/Group: Individual Therapy  Dwain Huhn Clent Demark 10/27/2019, 1:59 PM

## 2019-10-27 NOTE — Progress Notes (Signed)
Occupational Therapy Session Note  Patient Details  Name: Ryan Reed MRN: [id number] Date of Birth: January 29, 1951  Today's Date: 10/27/2019 OT Individual Time: 1001-1100 OT Individual Time Calculation (min): 59 min   Skilled Therapeutic Interventions/Progress Updates:    Pt greeted in bed, requesting for brief to be checked. Pt rolling Rt>Lt with Mod A for LE mgt while brief was changed and perihygiene performed post BM incontinence. Pt was able to complete hygiene in the front with HOB elevated. Pt then proceeded with LB bathing/dressing using circle sitting techniques with HOB raised. Pt intermittently losing balance towards Rt side and used the bedrail to pull trunk back into neutral alignment. He needed Min A to manage the L LE during stated tasks and supervision for mgt of R LE. Pt able to pull Rt sock up over heel after OT started him off halfway. Total A for Teds and ACE wraps. While supine, pt logrolled Lt>Rt to elevate pants over hips with Mod A to maintain back precautions. Max[place]y transfer completed dependently to w/c where pt then completed UB self care, oral care, and grooming tasks. He needed Min A to pull shirt down over trunk, but setup provided for all other tasks. Pt very grateful for opportunity to get up in his chair. He self propelled the w/c over towards the window to retrieve/put away his oral care supplies as well. At end of session pt was reclined for comfort and pressure relief in TIS. Left him with all needs within reach.   Therapy Documentation Precautions:  Precautions Precautions: Cervical, Back Restrictions Weight Bearing Restrictions: No Vital Signs: Therapy Vitals Temp: 98.8 F (37.1 C) Temp Source: Oral Pulse Rate: 70 Resp: 20 BP: 109/71 Patient Position (if appropriate): Sitting Oxygen Therapy SpO2: 100 % O2 Device: Room Air ADL: ADL Eating: Set up Grooming: Setup Upper Body Bathing: Minimal assistance, Moderate assistance Lower Body Bathing:  Maximal assistance, Dependent Upper Body Dressing: Minimal assistance, Moderate assistance Lower Body Dressing: Dependent Toileting: Dependent Toilet Transfer: Unable to assess Tub/Shower Transfer: Unable to assess      Therapy/Group: Individual Therapy  Jimmye Wisnieski A Janee Ureste 10/27/2019, 3:46 PM

## 2019-10-27 NOTE — Progress Notes (Signed)
[place] PHYSICAL MEDICINE & REHABILITATION PROGRESS NOTE   Subjective/Complaints: . Wife is visiting with him today. Patient still without good sensation for bowel or bladder.  He does have good cutaneous sensation below the level of lesion other than some tingling at the little fingers bilaterally. Wife is concerned about low hemoglobin.  ROS- denies SOB, CP, N/V/D, HA. Has abd pain Objective:   No results found. No results for input(s): WBC, HGB, HCT, PLT in the last 72 hours. No results for input(s): NA, K, CL, CO2, GLUCOSE, BUN, CREATININE, CALCIUM in the last 72 hours.  Intake/Output Summary (Last 24 hours) at 10/27/2019 1251 Last data filed at 10/27/2019 1100 Gross per 24 hour  Intake 750 ml  Output 3750 ml  Net -3000 ml     Physical Exam: Vital Signs Blood pressure 106/62, pulse 66, temperature 98.4 F (36.9 C), resp. rate 18, SpO2 98 %.  Physical Exam Nursing noteand vitalsreviewed. Constitutional: awake, alert, appropriate,lying in bed in hoyer sling getting transferred by OT; bed is low pressure air loss bed;NAD  HENT:  Head:Normocephalicand atraumatic.  Right Ear: External earnormal.  Eyes:conjugate gaze Neck:Normal range of motion. CV: RRR  Respiratory:CTA B/L  UL:AGTX abdomen; sl;ightly tight, TTP; distended; slightly hypoactive Musculoskeletal:  General: No deformity. d Comments: 1+ pedal edema bilaterally Neurological: He is alertand oriented to person, place, and time. Nocranial nerve deficit. Intact sensation in UE. Impaired sensation otherwise below chest. Biceps 5/5, triceps 5/5, WE 5/5; grip 4/5; finger abd 4/5 B/L. LE 0/5 except for perhaps some trace movement in toes. DTR's absent in LE still. Saw a few muscles spasms in LEs without volitional movement Skin: Skin iswarmand dry.No rashnoted. He is not diaphoretic.; still has foley in place Noerythema. Pressure injury to buttocks and scrotum--blistered area on right buttock  and on underside of scrotum.   Assessment/Plan: 1. Functional deficits secondary to C7 quadriplegia/ ASIA B with neurogenic bowel and bladder which require 3+ hours per day of interdisciplinary therapy in a comprehensive inpatient rehab setting.  Physiatrist is providing close team supervision and 24 hour management of active medical problems listed below.  Physiatrist and rehab team continue to assess barriers to discharge/monitor patient progress toward functional and medical goals  Care Tool:  Bathing    Body parts bathed by patient: Right arm, Left arm, Chest, Face, Abdomen, Front perineal area, Right upper leg, Right lower leg   Body parts bathed by helper: Buttocks, Left upper leg, Left lower leg Body parts n/a: Front perineal area, Buttocks   Bathing assist Assist Level: Minimal Assistance - Patient > 75%     Upper Body Dressing/Undressing Upper body dressing   What is the patient wearing?: Pull over shirt    Upper body assist Assist Level: Minimal Assistance - Patient > 75%    Lower Body Dressing/Undressing Lower body dressing      What is the patient wearing?: Pants, Incontinence brief     Lower body assist Assist for lower body dressing: Moderate Assistance - Patient 50 - 74%     Toileting Toileting Toileting Activity did not occur Landscape architect and hygiene only): N/A (no void or bm)  Toileting assist Assist for toileting: 2 Helpers     Transfers Chair/bed transfer  Transfers assist  Chair/bed transfer activity did not occur: Safety/medical concerns  Chair/bed transfer assist level: Dependent - mechanical lift     Locomotion Ambulation   Ambulation assist   Ambulation activity did not occur: Safety/medical concerns  Walk 10 feet activity   Assist  Walk 10 feet activity did not occur: Safety/medical concerns        Walk 50 feet activity   Assist Walk 50 feet with 2 turns activity did not occur: Safety/medical  concerns         Walk 150 feet activity   Assist Walk 150 feet activity did not occur: Safety/medical concerns         Walk 10 feet on uneven surface  activity   Assist Walk 10 feet on uneven surfaces activity did not occur: Safety/medical concerns         Wheelchair     Assist Will patient use wheelchair at discharge?: Yes Type of Wheelchair: Manual Wheelchair activity did not occur: Safety/medical concerns  Wheelchair assist level: Supervision/Verbal cueing Max wheelchair distance: 50    Wheelchair 50 feet with 2 turns activity    Assist    Wheelchair 50 feet with 2 turns activity did not occur: Safety/medical concerns   Assist Level: Supervision/Verbal cueing   Wheelchair 150 feet activity     Assist  Wheelchair 150 feet activity did not occur: Safety/medical concerns   Assist Level: Supervision/Verbal cueing   Blood pressure 106/62, pulse 66, temperature 98.4 F (36.9 C), resp. rate 18, SpO2 98 %.  Medical Problem List and Plan: 1.C7 Quadriplegia ASIA B and functional deficitssecondary to C7-T1 epidural abscess with associated paraplegia -patient maynot yetshower -ELOS/Goals: min assist PT/OT 28 days  12/2- will maintain on low air loss bed until Monday then reassess Stage II pressure ulcer he came with, patient wishes to switch to another bed he does not find current bed comfortable  12/3- PT doing ACE wraps/TEDs for orthostatic hypotension related to SCI- not using Abd binder 2. Antithrombotics: -DVT/anticoagulation:Eliquis 11/28- will try and get NSU to allow Lovenox due to  high risk of DVT in SCI patients.  11/29- has B/L gastroc DVTs- started Apixiban 10 mg BID x 1 week then 5 mg BID for DVTs.  -antiplatelet therapy: N/A 3. Pain Management:does not like oxycodone due to SE--will change to ultram prn.Encouragedpatient to take muscle relaxers and Tylenol as needed 4. Mood:LCSW to follow  for evaluation and support -antipsychotic agents: N/A 5. Neuropsych: This patientiscapable of making decisions onhisown behalf. 6. Skin/Wound Care:Routine pressure relief measureswith local care to sacrum. [place] RN consult as appropriate.  12/2- will con't low air loss bed unti Monday and then reassess- came with this Stage II to rehab 7. Fluids/Electrolytes/Nutrition:Monitor I's/O. Check lytesin a.m. 8.Acute blood loss anemia: Recheck CBC in a.m.  11/28- Hb 7.8- might cause more problems with orthostatic hypotension- will need abd binder, TEDs, and possibly Midodrine. Will monitor.   11/29- no SCDs due to DVTs 9.PAF: Monitor heart rate 3 times daily. Continue metoprolol 25 mg twice daily 10. Neurogenic bladder:  ICP volumes 600-762m- Nsg will need to increase freq to Q4h per orders 11.Neurogenic bowel: Reports p.o. intake has been poor due to change in taste---has been drinking a lot of smoothies. Discussed dietary needs and changes. Change senna to 2 every morning with suppositories in evening after supper for PM program. Start today. bowel program- evening after dinner. Between day and evening shift, needs to be done.  12/2- got bowel program- no results- will monitor closely; also, will get foley out today and start in/out cath program  12/3- will increase bowel meds today- if doesn't have BM tonight, will do mg citrate/enema tomorrow- added miralax and increased senokot to 3 tabs in AM  12/4- ordered mg citrate and in 3 hours do tap water enema- should hopefully work.  12/6 incont mushy BM-large 12. Leucocytosis in setting of Epidural abscess- on IV ABX x 8 weeks: Follow up CBC in am.  11/28- resolved leukocytosis- will monitor labs at least weekly-  121- ESR 82; CRP 11.6- will monitor weekly for now.  13. Hyperglycemia: Stress Induced hyperglycemia v/s IPG--will check Hgb A1C in am.  11/29- A1c 5.6 #14.  Normochromic normocytic anemia we will check stool  guaiac given anticoagulants, start iron supplement        LOS: 9 days A FACE TO FACE EVALUATION WAS PERFORMED  Charlett Blake 10/27/2019, 12:51 PM

## 2019-10-28 ENCOUNTER — Inpatient Hospital Stay (HOSPITAL_COMMUNITY): Payer: Medicare Other | Admitting: Physical Therapy

## 2019-10-28 ENCOUNTER — Inpatient Hospital Stay (HOSPITAL_COMMUNITY): Payer: Medicare Other | Admitting: Occupational Therapy

## 2019-10-28 ENCOUNTER — Inpatient Hospital Stay (HOSPITAL_COMMUNITY): Payer: Medicare Other | Admitting: *Deleted

## 2019-10-28 ENCOUNTER — Encounter (HOSPITAL_COMMUNITY): Payer: Self-pay | Admitting: Neurological Surgery

## 2019-10-28 DIAGNOSIS — G8250 Quadriplegia, unspecified: Secondary | ICD-10-CM | POA: Diagnosis not present

## 2019-10-28 LAB — CBC WITH DIFFERENTIAL/PLATELET
Abs Immature Granulocytes: 0.03 10*3/uL (ref 0.00–0.07)
Basophils Absolute: 0 10*3/uL (ref 0.0–0.1)
Basophils Relative: 1 %
Eosinophils Absolute: 0.2 10*3/uL (ref 0.0–0.5)
Eosinophils Relative: 3 %
HCT: 26.2 % — ABNORMAL LOW (ref 39.0–52.0)
Hemoglobin: 8.4 g/dL — ABNORMAL LOW (ref 13.0–17.0)
Immature Granulocytes: 0 %
Lymphocytes Relative: 18 %
Lymphs Abs: 1.3 10*3/uL (ref 0.7–4.0)
MCH: 29.1 pg (ref 26.0–34.0)
MCHC: 32.1 g/dL (ref 30.0–36.0)
MCV: 90.7 fL (ref 80.0–100.0)
Monocytes Absolute: 0.6 10*3/uL (ref 0.1–1.0)
Monocytes Relative: 8 %
Neutro Abs: 5 10*3/uL (ref 1.7–7.7)
Neutrophils Relative %: 70 %
Platelets: 443 10*3/uL — ABNORMAL HIGH (ref 150–400)
RBC: 2.89 MIL/uL — ABNORMAL LOW (ref 4.22–5.81)
RDW: 12.4 % (ref 11.5–15.5)
WBC: 7 10*3/uL (ref 4.0–10.5)
nRBC: 0 % (ref 0.0–0.2)

## 2019-10-28 LAB — BASIC METABOLIC PANEL
Anion gap: 8 (ref 5–15)
BUN: 10 mg/dL (ref 8–23)
CO2: 26 mmol/L (ref 22–32)
Calcium: 8.3 mg/dL — ABNORMAL LOW (ref 8.9–10.3)
Chloride: 102 mmol/L (ref 98–111)
Creatinine, Ser: 0.57 mg/dL — ABNORMAL LOW (ref 0.61–1.24)
GFR calc Af Amer: >60 mL/min (ref >60)
GFR calc non Af Amer: >60 mL/min (ref >60)
Glucose, Bld: 106 mg/dL — ABNORMAL HIGH (ref 70–99)
Potassium: 4 mmol/L (ref 3.5–5.1)
Sodium: 136 mmol/L (ref 135–145)

## 2019-10-28 LAB — SEDIMENTATION RATE: Sed Rate: 126 mm/hr — ABNORMAL HIGH (ref 0–16)

## 2019-10-28 LAB — C-REACTIVE PROTEIN: CRP: 8 mg/dL — ABNORMAL HIGH (ref <1.0)

## 2019-10-28 NOTE — Progress Notes (Signed)
Patient cathed at 2100 for 925cc. Patient also had a small bowel movement. Some residual LLQ pain noted. Tramadol given. Dig stimulation performed with no additional results at this time. When physically/medically ready-pt would benefit from being in a sitting up position on bsc to facilitate emptying. Continue to monitor.

## 2019-10-28 NOTE — Plan of Care (Signed)
  Problem: Education: Goal: Ability to verbalize activity precautions or restrictions will improve Outcome: Progressing Goal: Knowledge of the prescribed therapeutic regimen will improve Outcome: Progressing Goal: Understanding of discharge needs will improve Outcome: Progressing   Problem: Activity: Goal: Ability to avoid complications of mobility impairment will improve Outcome: Progressing Goal: Ability to tolerate increased activity will improve Outcome: Progressing Goal: Will remain free from falls Outcome: Progressing   Problem: Bowel/Gastric: Goal: Gastrointestinal status for postoperative course will improve Outcome: Progressing   Problem: Clinical Measurements: Goal: Ability to maintain clinical measurements within normal limits will improve Outcome: Progressing Goal: Postoperative complications will be avoided or minimized Outcome: Progressing Goal: Diagnostic test results will improve Outcome: Progressing   Problem: Pain Management: Goal: Pain level will decrease Outcome: Progressing   Problem: Skin Integrity: Goal: Will show signs of wound healing Outcome: Progressing   Problem: Health Behavior/Discharge Planning: Goal: Identification of resources available to assist in meeting health care needs will improve Outcome: Progressing   Problem: Bladder/Genitourinary: Goal: Urinary functional status for postoperative course will improve Outcome: Progressing   Problem: Consults Goal: RH SPINAL CORD INJURY PATIENT EDUCATION Description:  See Patient Education module for education specifics.  Outcome: Progressing   Problem: Consults Goal: RH SPINAL CORD INJURY PATIENT EDUCATION Description:  See Patient Education module for education specifics.  Outcome: Progressing   Problem: SCI BOWEL ELIMINATION Goal: RH STG SCI MANAGE BOWEL PROGRAM W/ASSIST OR AS APPROPRIATE Description: STG SCI Manage bowel program w/assist or as appropriate. Mod Outcome: Progressing   Problem: SCI BLADDER ELIMINATION Goal: RH STG SCI MANAGE BLADDER PROGRAM W/ASSISTANCE Description: Patient will manage bladder with mod assist Outcome: Progressing   Problem: RH SKIN INTEGRITY Goal: RH STG MAINTAIN SKIN INTEGRITY WITH ASSISTANCE Description: STG Maintain Skin Integrity With Assistance. Mod Outcome: Progressing Goal: RH STG ABLE TO PERFORM INCISION/WOUND CARE W/ASSISTANCE Description: STG Able To Perform Incision/Wound Care With Assistance. Max Outcome: Progressing

## 2019-10-28 NOTE — Progress Notes (Signed)
Bowel program was completed at 6pm tonight. With dig stimulation he has a small bowel loose bowel movement. Adria Devon, LPN

## 2019-10-28 NOTE — Progress Notes (Signed)
Occupational Therapy Session Note  Patient Details  Name: Ryan Reed MRN: [id number] Date of Birth: Feb 04, 1951  Today's Date: 10/28/2019 OT Individual Time: 0730-0830 OT Individual Time Calculation (min): 60 min    Short Term Goals: Week 2:  OT Short Term Goal 1 (Week 2): Pt will dress LB with set-up bed level using AE PRN OT Short Term Goal 2 (Week 2): Pt will direct caregiver with set-up and technique of sliding board transfer independently OT Short Term Goal 3 (Week 2): Pt will consistently complete sliding board transfers with +1 assist in order to reduce caregiver burden OT Short Term Goal 4 (Week 2): Pt will be independent in directing care and initiating pressure relief schedule  Skilled Therapeutic Interventions/Progress Updates:    Pt seen for OT session focusing on ADL re-training and education. Pt awake in supine upon arrival, denying pain and agreeable to tx session.  Pt declined LB bathing, desiring to get into chair to complete UB bathing/dressing.  ADL re-training: TED hose, ACE wraps and pants donned bed level. Pt able to roll to pull pants up with CGA. Once in tilt-in-space w/c, he propelled around room to gather needed supplies with supervision. Completed UB bathing/dressing from w/c level with supervision for dynamic sitting balance when sitting upright in w/c to complete task  Transfers: Rolled R/L with assist to position LEs with min A and use of bed rails. Able to maintain side-lying position independently. Maxi-sky transfer Supine>tilt-in-space w/c.   Education: Throughout session regarding OT/PT goals, POC, continuum of care, DME, activity progression, decreasing caregiver burden, bowel/bladder program and self-cathing and d/c planning. Pt becoming tearful, scared of burden he will put on wife. Empathetic listening, support and education provided.  Pt left seated in tilt-in-space w/c at end of session, all needs in reach, NT made aware of pt's position and  need for pressure relief schedule known to pt.   Therapy Documentation Precautions:  Precautions Precautions: Cervical, Back Restrictions Weight Bearing Restrictions: No Pain:   No/denies pain   Therapy/Group: Individual Therapy  Tyshawna Alarid L 10/28/2019, 6:56 AM

## 2019-10-28 NOTE — Progress Notes (Signed)
Physical Therapy Session Note  Patient Details  Name: Ryan Reed MRN: [id number] Date of Birth: 10-10-1951  Today's Date: 10/28/2019 PT Individual Time: 1000-1030 and 1345-1430 PT Individual Time Calculation (min): 30 min and 45 min  Short Term Goals: Week 2:  PT Short Term Goal 1 (Week 2): Pt will recall 3 pressure relief techniques and pressure relief schedule PT Short Term Goal 2 (Week 2): Pt will perform bed mobility with assist x 1 PT Short Term Goal 3 (Week 2): Pt will maintain sitting balance with min A x 5 min  Skilled Therapeutic Interventions/Progress Updates: Pt presented in bed agreeable to therapy. Performed rolling L/R to complete pulling pants over hips and place sling minA. Pt then transferred to TIS via maxiSky. Pt transported to ortho gym and participated in [place] L6 forwards, L5 backwards x 4 min each for general conditioning and endurance. Pt returned to room at end of session and ageeable to remain in TIS with call bell within reach and needs met.   Tx2: Pt presented in bed agreeable to therapy. Pt stating some pain mainly abdominal area, no intervention required. Session focused on PROM/stretching BLE with hands on training with wife. Performed knee flexion/extension, hip ER/IR, supine cross stretch for hip ER, DF/PF, hamstring and heel cord stretch. [name], wife demonstrated fair technique with min cues and good potential for carryover. Pt then transitioned to long sit maxA and worked on sitting balance modA with UE support both on mattress and use of bed rail x approx min. Pt returned to supine and repositioned in sidelying providing [name] instruction in use of pillows for positioning. Pt left in sidelying with call bell within reach, bed alarm on, and needs met.      Therapy Documentation Precautions:  Precautions Precautions: Cervical, Back Restrictions Weight Bearing Restrictions: No General:   Vital Signs: Therapy Vitals Temp: 98.3 F (36.8  C) Temp Source: Oral Pulse Rate: 84 Resp: 17 BP: 116/60 Patient Position (if appropriate): Lying Oxygen Therapy SpO2: 99 % O2 Device: Room Air   Therapy/Group: Individual Therapy  Chilton Sallade  Remy Dia, PTA  10/28/2019, 3:53 PM

## 2019-10-28 NOTE — Progress Notes (Signed)
Orthopedic Tech Progress Note Patient Details:  Ryan Reed [birth year] [id number]  Ortho Devices Type of Ortho Device: Prafo boot/shoe Ortho Device/Splint Location: Bilateral Prafo boots Ortho Device/Splint Interventions: Application   Post Interventions Patient Tolerated: Well Instructions Provided: Care of device   [place] Pink 10/28/2019, 11:05 AM

## 2019-10-28 NOTE — Progress Notes (Addendum)
At time of hygiene care, noted that foam dressing was not in place. The patients wounds have changed since noted last night. The coccyx wound now extends to the left buttock, which is not covered by the sacral drsg. The left buttock could have been caused by shearing, there are spots that are blanchable, but it is connected to the coccyx wound & was included in today's measurements. His scrotum has an area of redness on the left lateral lower side, seen from the back. There is a small circular area on the left buttock that looks yellow under the skin. A new drsg was applied. He has an air mattress but has been complaining that it is very uncomfortable & he feels like he's laying on rails. He is awaiting a replacement. His temp this morning was 99.8 as reported by the nurse tech. No c/o pain except when we turned him. He does have some feelings in his legs & he can feel it when he is catheterized. His legs lay outward . Heel protection is on, but they don't prevent his legs or feet from rotating outward. A pillow was placed to the right leg to hold his legs straight in the bed. Will continue to monitor & give report to the oncoming nurse. Communication was put in for the physician to assess during rounds for a possible treat ment change.

## 2019-10-28 NOTE — Progress Notes (Signed)
[place] PHYSICAL MEDICINE & REHABILITATION PROGRESS NOTE   Subjective/Complaints: . Pt reports didn't get 2nd/3rd dig stim last night, only first one- as a result, didn't have BM with bowel program, only hours later ~ 2-4 am had 2 small BMs.  Notes that in last week, ~1/2 of time, didn't get 2nd dig stim. Also, stomach growling and feels like needs another BM.  Legs sit to side per nurse, so needs PRAFOs.  Also, coccyx and L buttock wound have extended, but pt said current bed feels like laying on nails.  Needs to turn every 2 hours  ROS- denies SOB, CP, N/V/D, HA. Has abd pain Objective:   No results found. Recent Labs    10/28/19 0351  WBC 7.0  HGB 8.4*  HCT 26.2*  PLT 443*   Recent Labs    10/28/19 0351  NA 136  K 4.0  CL 102  CO2 26  GLUCOSE 106*  BUN 10  CREATININE 0.57*  CALCIUM 8.3*    Intake/Output Summary (Last 24 hours) at 10/28/2019 0952 Last data filed at 10/28/2019 0830 Gross per 24 hour  Intake 600 ml  Output 4075 ml  Net -3475 ml     Physical Exam: Vital Signs Blood pressure (!) 105/58, pulse 66, temperature 99.8 F (37.7 C), resp. rate 19, SpO2 99 %.  Physical Exam Nursing noteand vitalsreviewed. Constitutional: awake, alert, appropriate; sitting up in manual w/c, stomach growling; NAD  HENT:  Head:Normocephalicand atraumatic.  Right Ear: External earnormal.  Eyes:conjugate gaze Neck:Normal range of motion. CV: RRR  Respiratory:CTA B/L  FB:PZWC abdomen; Less distended; hyperactive BS; soft, NT Musculoskeletal:  General: No deformity. d Comments: 1+ pedal edema bilaterally Neurological: He is alertand oriented to person, place, and time. Nocranial nerve deficit. Intact sensation in UE. Impaired sensation otherwise below chest. Biceps 5/5, triceps 5/5, WE 5/5; grip 4/5; finger abd 4/5 B/L. LE 0/5 except for perhaps some trace movement in toes. DTR's absent in LE still. Saw a few muscles spasms in LEs without  volitional movement Skin: Skin iswarmand dry.No rashnoted. He is not diaphoretic.; still has foley in place Noerythema. Pressure injury to buttocks and scrotum--blistered area on right buttock and on underside of scrotum. Is now on L buttock, but unable to assess since up in w/c.    Assessment/Plan: 1. Functional deficits secondary to C7 quadriplegia/ ASIA B with neurogenic bowel and bladder which require 3+ hours per day of interdisciplinary therapy in a comprehensive inpatient rehab setting.  Physiatrist is providing close team supervision and 24 hour management of active medical problems listed below.  Physiatrist and rehab team continue to assess barriers to discharge/monitor patient progress toward functional and medical goals  Care Tool:  Bathing    Body parts bathed by patient: Right arm, Left arm, Chest, Face, Abdomen, Front perineal area, Right upper leg, Right lower leg   Body parts bathed by helper: Buttocks, Left upper leg, Left lower leg Body parts n/a: Front perineal area, Buttocks   Bathing assist Assist Level: Minimal Assistance - Patient > 75%     Upper Body Dressing/Undressing Upper body dressing   What is the patient wearing?: Pull over shirt    Upper body assist Assist Level: Minimal Assistance - Patient > 75%    Lower Body Dressing/Undressing Lower body dressing      What is the patient wearing?: Pants, Incontinence brief     Lower body assist Assist for lower body dressing: Moderate Assistance - Patient 50 - 74%  Toileting Toileting Toileting Activity did not occur Landscape architect and hygiene only): N/A (no void or bm)  Toileting assist Assist for toileting: 2 Helpers     Transfers Chair/bed transfer  Transfers assist  Chair/bed transfer activity did not occur: Safety/medical concerns  Chair/bed transfer assist level: Maximal Assistance - Patient 25 - 49%     Locomotion Ambulation   Ambulation assist   Ambulation  activity did not occur: Safety/medical concerns          Walk 10 feet activity   Assist  Walk 10 feet activity did not occur: Safety/medical concerns        Walk 50 feet activity   Assist Walk 50 feet with 2 turns activity did not occur: Safety/medical concerns         Walk 150 feet activity   Assist Walk 150 feet activity did not occur: Safety/medical concerns         Walk 10 feet on uneven surface  activity   Assist Walk 10 feet on uneven surfaces activity did not occur: Safety/medical concerns         Wheelchair     Assist Will patient use wheelchair at discharge?: Yes Type of Wheelchair: Manual Wheelchair activity did not occur: Safety/medical concerns  Wheelchair assist level: Supervision/Verbal cueing Max wheelchair distance: 150'    Wheelchair 50 feet with 2 turns activity    Assist    Wheelchair 50 feet with 2 turns activity did not occur: Safety/medical concerns   Assist Level: Supervision/Verbal cueing   Wheelchair 150 feet activity     Assist  Wheelchair 150 feet activity did not occur: Safety/medical concerns   Assist Level: Supervision/Verbal cueing   Blood pressure (!) 105/58, pulse 66, temperature 99.8 F (37.7 C), resp. rate 19, SpO2 99 %.  Medical Problem List and Plan: 1.C7 Quadriplegia ASIA B and functional deficitssecondary to C7-T1 epidural abscess with associated paraplegia -patient maynot yetshower -ELOS/Goals: min assist PT/OT 28 days  12/2- will maintain on low air loss bed until Monday then reassess Stage II pressure ulcer he came with, patient wishes to switch to another bed he does not find current bed comfortable  12/7- change to regular bed; turn every 2 hours from side to side, since has wound on coccyx/L buttock.   12/3- PT doing ACE wraps/TEDs for orthostatic hypotension related to SCI- not using Abd binder 2. Antithrombotics: -DVT/anticoagulation:Eliquis 11/28-  will try and get NSU to allow Lovenox due to  high risk of DVT in SCI patients.  11/29- has B/L gastroc DVTs- started Apixiban 10 mg BID x 1 week then 5 mg BID for DVTs.  -antiplatelet therapy: N/A 3. Pain Management:does not like oxycodone due to SE--will change to ultram prn.Encouragedpatient to take muscle relaxers and Tylenol as needed 4. Mood:LCSW to follow for evaluation and support -antipsychotic agents: N/A 5. Neuropsych: This patientiscapable of making decisions onhisown behalf. 6. Skin/Wound Care:Routine pressure relief measureswith local care to sacrum. [place] RN consult as appropriate.  12/2- will con't low air loss bed unti Monday and then reassess- came with this Stage II to rehab  12/7- put back on regular bed and turn q2 hours.  7. Fluids/Electrolytes/Nutrition:Monitor I's/O. Check lytesin a.m. 8.Acute blood loss anemia: Recheck CBC in a.m.  11/28- Hb 7.8- might cause more problems with orthostatic hypotension- will need abd binder, TEDs, and possibly Midodrine. Will monitor.   11/29- no SCDs due to DVTs 9.PAF: Monitor heart rate 3 times daily. Continue metoprolol 25 mg twice daily 10. Neurogenic bladder:  ICP volumes 600-726m- Nsg will need to increase freq to Q4h per orders 11.Neurogenic bowel: Reports p.o. intake has been poor due to change in taste---has been drinking a lot of smoothies. Discussed dietary needs and changes. Change senna to 2 every morning with suppositories in evening after supper for PM program. Start today. bowel program- evening after dinner. Between day and evening shift, needs to be done.  12/2- got bowel program- no results- will monitor closely; also, will get foley out today and start in/out cath program  12/3- will increase bowel meds today- if doesn't have BM tonight, will do mg citrate/enema tomorrow- added miralax and increased senokot to 3 tabs in AM  12/4- ordered mg citrate and in 3 hours do tap  water enema- should hopefully work.  12/6 incont mushy BM-large  12/7- need to do all dig stim so doesn't have accidents later on, like today. 12. Leucocytosis in setting of Epidural abscess- on IV ABX x 8 weeks: Follow up CBC in am.  11/28- resolved leukocytosis- will monitor labs at least weekly-  121- ESR 82; CRP 11.6- will monitor weekly for now.  13. Hyperglycemia: Stress Induced hyperglycemia v/s IPG--will check Hgb A1C in am.  11/29- A1c 5.6 #14.  Normochromic normocytic anemia we will check stool guaiac given anticoagulants, start iron supplement  12/7- back up to 8.4- doing better/is recovering some        LOS: 10 days A FACE TO FACE EVALUATION WAS PERFORMED  Mahasin Riviere 10/28/2019, 9:52 AM

## 2019-10-28 NOTE — Progress Notes (Signed)
Physical Therapy Session Note  Patient Details  Name: Ryan Reed MRN: [id number] Date of Birth: 1951-01-27  Today's Date: 10/28/2019 PT Individual Time: 1100-1200 PT Individual Time Calculation (min): 60 min   Short Term Goals: Week 2:  PT Short Term Goal 1 (Week 2): Pt will recall 3 pressure relief techniques and pressure relief schedule PT Short Term Goal 2 (Week 2): Pt will perform bed mobility with assist x 1 PT Short Term Goal 3 (Week 2): Pt will maintain sitting balance with min A x 5 min  Skilled Therapeutic Interventions/Progress Updates:    Pt received reclined in TIS w/c in room, agreeable to PT session. Pt reports ongoing abdominal discomfort, not rated and declines intervention. Reviewed pressure relief to be performed while pt seated in w/c, he demos good understanding of pressure relief schedule and purpose of pressure relief. Maxi sky transfer w/c to EOB. Seated balance EOB with min to mod A overall with intermittent use of BUE support. Seated L/R lateral leans x 5 reps each with max A to return to sitting upright, focus on activation of core musculature vs use of UE to obtain position. Pt reports feeling mildly light-headed in sitting, BP 107/62 with use of thigh-high TEDS and ACE wraps. Symptoms resolve while seated. Pt is dependent to don leg lifters while seated EOB with assist from a 2nd person to assist pt with maintaining sitting balance. Sit to supine max A with use of leg lifters for BLE management. Pt still requires assist for BLE management as well as trunk control with transfer but exhibits increase use of leg lifter with each trial. Supine BLE PROM to hip and knee in all available planes of motion to prevent contracture, reviewed handout provided to patient. Pt reports tightness in L hip adductor muscle, palpable muscle tightness. Provided stretch 3 x 60 sec and hot pack to area at end of session. Education with patient about hot pack use and skin inspection  following use. Pt left semi-reclined in bed with needs in reach at end of session.  Therapy Documentation Precautions:  Precautions Precautions: Cervical, Back Restrictions Weight Bearing Restrictions: No    Therapy/Group: Individual Therapy   Excell Seltzer, PT, DPT  10/28/2019, 4:04 PM

## 2019-10-29 ENCOUNTER — Inpatient Hospital Stay (HOSPITAL_COMMUNITY): Payer: Medicare Other | Admitting: Physical Therapy

## 2019-10-29 ENCOUNTER — Inpatient Hospital Stay (HOSPITAL_COMMUNITY): Payer: Medicare Other | Admitting: Occupational Therapy

## 2019-10-29 ENCOUNTER — Inpatient Hospital Stay (HOSPITAL_COMMUNITY): Payer: Medicare Other | Admitting: *Deleted

## 2019-10-29 DIAGNOSIS — G8250 Quadriplegia, unspecified: Secondary | ICD-10-CM | POA: Diagnosis not present

## 2019-10-29 NOTE — Progress Notes (Signed)
Patient repositioned q2h at hs, PRAFOs on. One incontinent large stool at 0115. Patient cathed q4h due to large volumes 2109 for 925cc, 0115 for 550cc, 0515 for 850cc.

## 2019-10-29 NOTE — Progress Notes (Signed)
[place] PHYSICAL MEDICINE & REHABILITATION PROGRESS NOTE   Subjective/Complaints: . Pt reports had incontinent BM last night- was around 1am- nurse turned him every 2 hours at night.  Was cathed last night for high volumes 925cc, 550cc, and 850 cc- likely due to equilibrating while sleeping and bladder output high as a result.   Said they aren't using sliding board as much due to his skin on his bottom.  Bowel program was done well yesterday but bowels haven't gotten used to habit as of yet.    ROS- denies SOB, CP, N/V/D, HA.  Objective:   No results found. Recent Labs    10/28/19 0351  WBC 7.0  HGB 8.4*  HCT 26.2*  PLT 443*   Recent Labs    10/28/19 0351  NA 136  K 4.0  CL 102  CO2 26  GLUCOSE 106*  BUN 10  CREATININE 0.57*  CALCIUM 8.3*    Intake/Output Summary (Last 24 hours) at 10/29/2019 0943 Last data filed at 10/29/2019 9628 Gross per 24 hour  Intake 520 ml  Output 2725 ml  Net -2205 ml     Physical Exam: Vital Signs Blood pressure 98/61, pulse 65, temperature 98.3 F (36.8 C), resp. rate 16, SpO2 100 %.  Physical Exam Nursing noteand vitalsreviewed. Constitutional: awake, alert, appropriate; sitting up in bed; tearful at times due to grief from SCI; NAD  HENT:  Head:Normocephalicand atraumatic.   Eyes:conjugate gaze Neck:Normal range of motion. CV: RRR  Respiratory:CTA B/L  ZM:OQHU abdomen; (+)BS, NT, ND Musculoskeletal:  General: No deformity.  Comments: 1+ pedal edema bilaterally Neurological: He is alertand oriented to person, place, and time. Nocranial nerve deficit. Intact sensation in UE. Impaired sensation otherwise below chest. Biceps 5/5, triceps 5/5, WE 5/5; grip 4/5; finger abd 4/5 B/L. LE 0/5 except for perhaps some trace movement in toes. DTR's absent in LE still. Saw a few muscles spasms in LEs without volitional movement Skin: Skin iswarmand dry.No rashnoted. He is not diaphoretic.; still has foley in  place Noerythema. Pressure injury to buttocks and scrotum--blistered area on right buttock and on underside of scrotum.  Now has pressure injury/DTI seen in middle- Stage II- near coccyx on L and R buttocks and surrounded by large area of shear injury on both sides with scuffed skin.    Assessment/Plan: 1. Functional deficits secondary to C7 quadriplegia/ ASIA B with neurogenic bowel and bladder which require 3+ hours per day of interdisciplinary therapy in a comprehensive inpatient rehab setting.  Physiatrist is providing close team supervision and 24 hour management of active medical problems listed below.  Physiatrist and rehab team continue to assess barriers to discharge/monitor patient progress toward functional and medical goals  Care Tool:  Bathing    Body parts bathed by patient: Right arm, Left arm, Chest, Face, Abdomen, Front perineal area, Right upper leg, Right lower leg   Body parts bathed by helper: Buttocks, Left upper leg, Left lower leg Body parts n/a: Front perineal area, Buttocks   Bathing assist Assist Level: Minimal Assistance - Patient > 75%     Upper Body Dressing/Undressing Upper body dressing   What is the patient wearing?: Pull over shirt    Upper body assist Assist Level: Minimal Assistance - Patient > 75%    Lower Body Dressing/Undressing Lower body dressing      What is the patient wearing?: Pants, Incontinence brief     Lower body assist Assist for lower body dressing: Moderate Assistance - Patient 50 - 74%  Toileting Toileting Toileting Activity did not occur Landscape architect and hygiene only): N/A (no void or bm)  Toileting assist Assist for toileting: 2 Helpers     Transfers Chair/bed transfer  Transfers assist  Chair/bed transfer activity did not occur: Safety/medical concerns  Chair/bed transfer assist level: Dependent - mechanical lift     Locomotion Ambulation   Ambulation assist   Ambulation activity did not  occur: Safety/medical concerns          Walk 10 feet activity   Assist  Walk 10 feet activity did not occur: Safety/medical concerns        Walk 50 feet activity   Assist Walk 50 feet with 2 turns activity did not occur: Safety/medical concerns         Walk 150 feet activity   Assist Walk 150 feet activity did not occur: Safety/medical concerns         Walk 10 feet on uneven surface  activity   Assist Walk 10 feet on uneven surfaces activity did not occur: Safety/medical concerns         Wheelchair     Assist Will patient use wheelchair at discharge?: Yes Type of Wheelchair: Manual Wheelchair activity did not occur: Safety/medical concerns  Wheelchair assist level: Supervision/Verbal cueing Max wheelchair distance: 150'    Wheelchair 50 feet with 2 turns activity    Assist    Wheelchair 50 feet with 2 turns activity did not occur: Safety/medical concerns   Assist Level: Supervision/Verbal cueing   Wheelchair 150 feet activity     Assist  Wheelchair 150 feet activity did not occur: Safety/medical concerns   Assist Level: Supervision/Verbal cueing   Blood pressure 98/61, pulse 65, temperature 98.3 F (36.8 C), resp. rate 16, SpO2 100 %.  Medical Problem List and Plan: 1.C7 Quadriplegia ASIA B and functional deficitssecondary to C7-T1 epidural abscess with associated paraplegia -patient maynot yetshower -ELOS/Goals: min assist PT/OT 28 days  12/2- will maintain on low air loss bed until Monday then reassess Stage II pressure ulcer he came with, patient wishes to switch to another bed he does not find current bed comfortable  12/7- change to regular bed; turn every 2 hours from side to side, since has wound on coccyx/L buttock.  12/8- turing q2 hours   12/3- PT doing ACE wraps/TEDs for orthostatic hypotension related to SCI- not using Abd binder 2. Antithrombotics: -DVT/anticoagulation:Eliquis 11/28-  will try and get NSU to allow Lovenox due to  high risk of DVT in SCI patients.  11/29- has B/L gastroc DVTs- started Apixiban 10 mg BID x 1 week then 5 mg BID for DVTs.  -antiplatelet therapy: N/A 3. Pain Management:does not like oxycodone due to SE--will change to ultram prn.Encouragedpatient to take muscle relaxers and Tylenol as needed 4. Mood:LCSW to follow for evaluation and support -antipsychotic agents: N/A 5. Neuropsych: This patientiscapable of making decisions onhisown behalf. 6. Skin/Wound Care:Routine pressure relief measureswith local care to sacrum. [place] RN consult as appropriate.  12/2- will con't low air loss bed unti Monday and then reassess- came with this Stage II to rehab  12/7- put back on regular bed and turn q2 hours.   12/8- turning well- and wearing PRAFOs to help feet/stop skin breakdown and prevent foot drop somewhat 7. Fluids/Electrolytes/Nutrition:Monitor I's/O. Check lytesin a.m. 8.Acute blood loss anemia: Recheck CBC in a.m.  11/28- Hb 7.8- might cause more problems with orthostatic hypotension- will need abd binder, TEDs, and possibly Midodrine. Will monitor.   11/29- no SCDs due  to DVTs 9.PAF: Monitor heart rate 3 times daily. Continue metoprolol 25 mg twice daily 10. Neurogenic bladder:  ICP volumes 600-751m- Nsg will need to increase freq to Q4h per orders  12/8- even at q4 hours, pt's volumes at night are 550-925cc- will d/w pt. 11.Neurogenic bowel: Reports p.o. intake has been poor due to change in taste---has been drinking a lot of smoothies. Discussed dietary needs and changes. Change senna to 2 every morning with suppositories in evening after supper for PM program. Start today. bowel program- evening after dinner. Between day and evening shift, needs to be done.  12/2- got bowel program- no results- will monitor closely; also, will get foley out today and start in/out cath program  12/3- will increase  bowel meds today- if doesn't have BM tonight, will do mg citrate/enema tomorrow- added miralax and increased senokot to 3 tabs in AM  12/4- ordered mg citrate and in 3 hours do tap water enema- should hopefully work.  12/6 incont mushy BM-large  12/7- need to do all dig stim so doesn't have accidents later on, like today.  1/28- bowel habit hasn't kicked in yet- still having incontinence 12. Leucocytosis in setting of Epidural abscess- on IV ABX x 8 weeks: Follow up CBC in am.  11/28- resolved leukocytosis- will monitor labs at least weekly-  121- ESR 82; CRP 11.6- will monitor weekly for now.  13. Hyperglycemia: Stress Induced hyperglycemia v/s IPG--will check Hgb A1C in am.  11/29- A1c 5.6 #14.  Normochromic normocytic anemia we will check stool guaiac given anticoagulants, start iron supplement  12/7- back up to 8.4- doing better/is recovering some        LOS: 11 days A FACE TO FACE EVALUATION WAS PERFORMED  Jahmire Ruffins 10/29/2019, 9:43 AM

## 2019-10-29 NOTE — Progress Notes (Signed)
Physical Therapy Session Note  Patient Details  Name: Ryan Reed MRN: [id number] Date of Birth: 06-11-1951  Today's Date: 10/29/2019 PT Individual Time: 1335-1445 PT Individual Time Calculation (min): 70 min   Short Term Goals: Week 2:  PT Short Term Goal 1 (Week 2): Pt will recall 3 pressure relief techniques and pressure relief schedule PT Short Term Goal 2 (Week 2): Pt will perform bed mobility with assist x 1 PT Short Term Goal 3 (Week 2): Pt will maintain sitting balance with min A x 5 min  Skilled Therapeutic Interventions/Progress Updates: Pt presented in w/c with wife present agreeable to therapy. Pt stating come continued discomfort at R groin (muscular). Pt propelled to w/c supervision with increased time as pt stating more difficult to propel standard w/c vs TIS. Pt then performed [facility] board transfer to high/low mat with maxA x 2 due to poor dynamic sitting balance. Pt was able to perform sufficient lateral lean to facilitate placement of [facility] board. Pt then participated in sitting balance with decreasing amount of support from physioball. Pt participated in sitting balance elevating single UE then BUE requiring overal min to modA with fatigue. Pt then returned to w/c in same manner as prior. Once in room pt performed [facility] board transfer with maxA x 2. In bed pt performed rolling L/R with minA to remove shorts and brief to trial Maxi Sky transfer to [facility] per nsg request. Pt transferred to [facility] and MaxiSky lowered where pt was resting on BSC but sling providing enough tension to maintain upright balance. Pt was able to tolerate sitting insling approx 10 min (-BM). Pt transferred back to bed and skin assessed. Pt noted to have red markings from where straps were. Pt only noted pain at groin but due to poor sensation did not feel straps pressing against skin. PTA donned brief once back in bed and PTA performed ART to hip adductors with PTA advising may have slight bruising from where  technique performed due to deep ms. Pt voiced understanding. PTA then placed hot pack on area treated with PTA advising to keep on for no more than 20 min and advising wife who was present to remove. Wife verbalized understanding. Pt left in bed at end of session with bed alarm on, call bell within reach and needs met.      Therapy Documentation Precautions:  Precautions Precautions: Cervical, Back Restrictions Weight Bearing Restrictions: No General:   Vital Signs: Therapy Vitals Temp: 98.2 F (36.8 C) Temp Source: Oral Pulse Rate: 66 Resp: 18 BP: (!) 100/57 Patient Position (if appropriate): Sitting Oxygen Therapy SpO2: 100 % O2 Device: Room Air   Therapy/Group: Individual Therapy  Casady Voshell  Shahrzad Koble, PTA  10/29/2019, 3:52 PM

## 2019-10-29 NOTE — Patient Care Conference (Signed)
Inpatient RehabilitationTeam Conference and Plan of Care Update Date: 10/29/2019   Time: 11:00 AM    Patient Name: Ryan Reed      Medical Record Number: [id number]  Date of Birth: [birth year] Sex: Male         Room/Bed: 4W14C/4W14C-01 Payor Info: Payor: [place] / Plan: [facility] MEDICARE / Product Type: *No Product type* /    Admit Date/Time:  10/18/2019  5:03 PM  Primary Diagnosis:  Quadriplegia [facility])  Patient Active Problem List   Diagnosis Date Noted  . Quadriplegia ([place]) 10/19/2019  . 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 Epidural abscess 10/14/2019    Expected Discharge Date: Expected Discharge Date: 11/13/19  Team Members Present: Physician leading conference: Dr. Courtney Heys Social Worker Present: Lennart Pall, LCSW Nurse Present: Isla Pence, RN Case Manager: Karene Fry, RN PT Present: Excell Seltzer, PT OT Present: Amy Rounds, OT SLP Present: Jettie Booze, CF-SLP PPS Coordinator present : Gunnar Fusi, SLP     Current Status/Progress Goal Weekly Team Focus  Bowel/Bladder   incont bowel BP at 1800. Dig stim x2. Still multiple stools throughout day. Patient requiring i/o cath q4-6 hours (closer to 4 hours at night due to high volumes)  empty bowel between 1800-1900 with bowel program. Patient or family will be cathing at discharge  Patient would benefit being up to [facility] when physically/medically ready to facilitate emptying of bowels. Need to start education on self cath techniques for home   Swallow/Nutrition/ Hydration             ADL's   Max A +2 sliding board transfers, min A LB Bathing/dressing bed level, UB bathing/dressing with supervision w/c level, total A toilleting bed level with incontinent BM and in/out cathing  Min A overall  ADL re-training, functional sitting balance, SCI education, bowel/bladder education/positioning for self-cathing   Mobility   min A rolling, max A bed mod mobility,  transfers via maxisky vs +2 for slide board  min A transfers, mod A bed mobility, S at w/c level  transfers, bed mobility, sitting balance   Communication             Safety/Cognition/ Behavioral Observations            Pain   4 out of 10 back pain surgical. Controlled with Tramadol PRN  less than 4 out of 10  assess and medicate pre treatment as needed   Skin   Pressure ulcer to Sacrum, shearing to Lt and Rt buttocks- Foam intact  No new skin breakdown. Healing of exisiting pressure ulcer  Continue turn q 2 hour schedule while in bed. Monitor incontinence and change foam dressing when soiled.    Rehab Goals Patient on target to meet rehab goals: Yes *See Care Plan and progress notes for long and short-term goals.     Barriers to Discharge  Current Status/Progress Possible Resolutions Date Resolved   Nursing  Neurogenic Bowel & Bladder;Wound Care;Incontinence  Patient/family need to start/continue education on cathing schedule and bowel program            PT  Inaccessible home environment;Home environment access/layout;Neurogenic Bowel & Bladder                 OT                  SLP                SW  Discharge Planning/Teaching Needs:  Pt to d/c home with wife who can provide 24/7 assistance.  Local son and daughter-in-law also to assist.  Teaching needs TBD   Team Discussion: Stage 2 with shear injury on back, large urine volumes at night, back on regular bed, on bowel program but with accidents, dealing with grief.  RN - Dig stim X2 last pm, small inc episode 1 am, urine volumes were high.  OT trial maxisky over [facility], needs TEDs and Ace wraps on, need to work on self cath, min A b/D bed level, transfers with [employer], education on going.  PT min A rolling, supine to sit max A, will set up w/c eval, need abd binder order.   Revisions to Treatment Plan: N/A     Medical Summary Current Status: still having incontinent stools- still high volumes at night  with caths; min assist bed level  ADLs; tried [employer] went OK Weekly Focus/Goal: doesn't have ability to sit on BSC due to balance will need TEDs/ACE wraps for [facility]; wants to work on self -cath  Barriers to Discharge: Home enviroment access/layout;Incontinence;Neurogenic Bowel & Bladder;Wound care;Medical stability  Barriers to Discharge Comments: C7 ASIA B SCI Possible Resolutions to Barriers: min assist for rolling in bed- max assist supine to sit- working with leg loops; custom w/c eval needed in next week; need abd binder   Continued Need for Acute Rehabilitation Level of Care: The patient requires daily medical management by a physician with specialized training in physical medicine and rehabilitation for the following reasons: Direction of a multidisciplinary physical rehabilitation program to maximize functional independence : Yes Medical management of patient stability for increased activity during participation in an intensive rehabilitation regime.: Yes Analysis of laboratory values and/or radiology reports with any subsequent need for medication adjustment and/or medical intervention. : Yes   I attest that I was present, lead the team conference, and concur with the assessment and plan of the team.   Retta Diones 10/29/2019, 4:23 PM  Team conference was held via web/ teleconference due to [place] - 19

## 2019-10-29 NOTE — Progress Notes (Signed)
Pt tolerated bowel program well. Rectal vault evacuated using dig stim x 2 prior to bowel program with medium brown results each time. Administered suppository waited 15/57min and performed dig stim x 2 with large results.

## 2019-10-29 NOTE — Progress Notes (Signed)
Occupational Therapy Session Note  Patient Details  Name: Ryan Reed MRN: [id number] Date of Birth: Jul 15, 1951  Today's Date: 10/29/2019 OT Individual Time: [id number] OT Individual Time Calculation (min): 70 min    Short Term Goals: Week 2:  OT Short Term Goal 1 (Week 2): Pt will dress LB with set-up bed level using AE PRN OT Short Term Goal 2 (Week 2): Pt will direct caregiver with set-up and technique of sliding board transfer independently OT Short Term Goal 3 (Week 2): Pt will consistently complete sliding board transfers with +1 assist in order to reduce caregiver burden OT Short Term Goal 4 (Week 2): Pt will be independent in directing care and initiating pressure relief schedule  Skilled Therapeutic Interventions/Progress Updates:    Pt seen for OT session focusing on ADL re-training and functional transfers. Pt awake in supine upon arrival, denying pain and agreeable to tx session. Pt now on standard hospital bed. Completed bathing/dressing routine from bed level.   ADL re-training. LB bathing completed with set-up/supervision in supported circle sit position. With increased time and effory, pt able to manipulat LEs into needed positions inorder to reach B feet. Assist required for clothing management when threading pants on. Returned bed to supine position and rolled with min A in order to reach behind and pull pants up.  UB bathing/dressing with assist to come forward into long sitting requiring B UE support on bed rails while assist provided to wash back and pull shirt up/down in back.   Mobility: Rolled R/L with min A for management of LE to initiate roll. Supine>sitting EOB with max A and VCs for technique.  B UE support and min-mod A for static sitting balance EOB.  Trialed beazy board transfer EOB>standard w/c +2 max A. Tranferred into standard chair for trial. Pt tolerated well for safe sitting balance and self propelled ~119ft with supervision.  Returned to bed at  end of session, transferred back to EOB in same manner as described above.  Pt left in supine at end of session, all needs in reach.   Therapy Documentation Precautions:  Precautions Precautions: Cervical, Back Restrictions Weight Bearing Restrictions: No   Therapy/Group: Individual Therapy  Laiya Wisby L 10/29/2019, 7:01 AM

## 2019-10-29 NOTE — Progress Notes (Signed)
Physical Therapy Session Note  Patient Details  Name: Ryan Reed MRN: [id number] Date of Birth: [birth year]  Today's Date: 10/29/2019 PT Individual Time: 1130-1200 PT Individual Time Calculation (min): 30 min   Short Term Goals: Week 2:  PT Short Term Goal 1 (Week 2): Pt will recall 3 pressure relief techniques and pressure relief schedule PT Short Term Goal 2 (Week 2): Pt will perform bed mobility with assist x 1 PT Short Term Goal 3 (Week 2): Pt will maintain sitting balance with min A x 5 min  Skilled Therapeutic Interventions/Progress Updates:    Pt received seated in bed, agreeable to PT session. No complaints of pain. Pt's wife [name] present during therapy session. Pt agreeable to get up to manual w/c. Rolling L/R with min A for placement of maxi sky sling. Maxi sky transfer bed to manual w/c. Reviewed pressure relief schedule and pt is able to independently perform lateral leans from w/c onto bed by removing arm rest of wheelchair. Pt demos good recall of pressure relief schedule. Discussed some options for patient's w/c he will d/c home with and that custom wheelchair assessment will take place later this week. Pt and his wife understanding of education. Pt left seated in w/c in room with needs in reach, wife present.  Therapy Documentation Precautions:  Precautions Precautions: Cervical, Back Restrictions Weight Bearing Restrictions: No    Therapy/Group: Individual Therapy   Excell Seltzer, PT, DPT  10/29/2019, 12:26 PM

## 2019-10-30 ENCOUNTER — Inpatient Hospital Stay (HOSPITAL_COMMUNITY): Payer: Medicare Other | Admitting: Occupational Therapy

## 2019-10-30 ENCOUNTER — Inpatient Hospital Stay (HOSPITAL_COMMUNITY): Payer: Medicare Other | Admitting: Physical Therapy

## 2019-10-30 ENCOUNTER — Inpatient Hospital Stay (HOSPITAL_COMMUNITY): Payer: Medicare Other

## 2019-10-30 DIAGNOSIS — G8250 Quadriplegia, unspecified: Secondary | ICD-10-CM | POA: Diagnosis not present

## 2019-10-30 NOTE — Evaluation (Signed)
Recreational Therapy Assessment and Plan  Patient Details  Name: Ryan Reed MRN: [id number] Date of Birth: 1950-12-17 Today's Date: 10/30/2019  Rehab Potential: Good ELOS: 12/23   Assessment  Problem List:      Patient Active Problem List   Diagnosis Date Noted  . 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 Epidural abscess 10/14/2019  . Paraplegia ([place]) 10/14/2019    Past Medical History: No past medical history on file. Past Surgical History:       Past Surgical History:  Procedure Laterality Date  . LACERATION REPAIR Left    left thigh  . THORACIC LAMINECTOMY FOR EPIDURAL ABSCESS N/A 10/14/2019   Procedure: Cervical seven - THORACIC seven LAMINECTOMIES FOR EPIDURAL ABSCESS;  Surgeon: [name], MD;  Location: [facility];  Service: Neurosurgery;  Laterality: N/A;    Assessment & Plan Clinical Impression: Patient is a 68 year old male in relatively good health except for possibly undiagnosed OSA, normocytic anemia who was developed severe back and chest pain--seen at urgent care on 10/13/19 with recommendations to go to ED but declined. He developed acute onset of paraplegia with sensory loss and was admitted on 10/14/19 for work up. 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 He was taken to the OR emergently for C7-T7 posterior decompressive laminectomy with medial facetectomy and foraminotomy for evacuation of epidural abscess by Dr. Ronnald Ramp. Postop had acute blood loss anemia requiring 4 units of packed red blood cells as well as A. fib requiring beta-blocker for rate control. Wound culture shows abundant Gemella morbilliform and he was started on Vanco/cefepime initially--this was narrowed to Rocephin 2 g twice daily on11/25.2D echo done showed EF of 60 to 65% with increased LVH--no  evidence of endocarditis. Dr. Graylon Good recommended 8 weeks of IV antibiotic therapy and will follow up for further recommendations. Patient with paraplegia and sensory deficits from chest down. Foley in place due to neurogenic bladder   Patient transferred to CIR on 10/18/2019.   Pt presents with decreased activity tolerance, decreased functional mobility, decreased balance Limiting pt's independence with leisure/community pursuits.   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) Social interaction - Mood/Behavior: Cooperative Engineer, drilling for Education?: Yes Recreational Therapy Orientation Orientation -Reviewed with patient: Available activity resources Strengths/Weaknesses Patient Strengths/Abilities: Willingness to participate;Active premorbidly Patient weaknesses: Physical limitations TR Patient demonstrates impairments in the following area(s): Endurance;Motor;Safety;Skin Integrity  Plan Rec Therapy Plan Is patient appropriate for Therapeutic Recreation?: Yes Rehab Potential: Good Treatment times per week: Min 1 TR session >20 minutes each week during LOS Estimated Length of Stay: 12/23 TR Treatment/Interventions: Adaptive equipment instruction;Community reintegration;Patient/family education;Therapeutic exercise;1:1 session;Functional mobility training;UE/LE Coordination activities;Balance/vestibular training;Recreation/leisure participation;Leisure education;Therapeutic activities;Wheelchair propulsion/positioning Recommendations for other services: Neuropsych  Recommendations for other services: Neuropsych  Discharge Criteria: Patient will be discharged from TR  if patient refuses treatment 3 consecutive times without medical reason.  If treatment goals not met, if there is a change in medical status, if patient makes no progress towards goals or if patient is discharged from hospital.  The above assessment, treatment plan, treatment alternatives and goals were discussed and mutually agreed upon: by patient  [place] 10/30/2019, 4:04 PM

## 2019-10-30 NOTE — Progress Notes (Signed)
Bowel program: performed dig stim x 2 prior to suppository with large results, admin suppository, dig stim x 2 with large results. Will recommend dig stim again to on coming shift.

## 2019-10-30 NOTE — Progress Notes (Signed)
Bladder program: Pt was able to perform self cath with supervision. Encouraged to continue with assist.

## 2019-10-30 NOTE — Progress Notes (Signed)
Physical Therapy Session Note  Patient Details  Name: Ryan Reed MRN: [id number] Date of Birth: 10/17/1951  Today's Date: 10/30/2019 PT Individual Time: 1415-1545 PT Individual Time Calculation (min): 90 min   Short Term Goals: Week 2:  PT Short Term Goal 1 (Week 2): Pt will recall 3 pressure relief techniques and pressure relief schedule PT Short Term Goal 2 (Week 2): Pt will perform bed mobility with assist x 1 PT Short Term Goal 3 (Week 2): Pt will maintain sitting balance with min A x 5 min  Skilled Therapeutic Interventions/Progress Updates:    Pt received seated in bed, agreeable to PT session. No complaints of pain. Provided home measurement sheet to patient and his wife to have his family complete, one to be completed for patient's son's house where he plans to d/c initially and one for patient's home. Again discussed options for manual w/c mobility to prepare for patient's seating evaluation tomorrow morning. Set pt up with 18x18 manual w/c for improved fit. Supine to sit with max A for BLE management and some trunk control. Beezy board transfer bed to w/c with max A x 1 and SBA x 1. Pt exhibits improved fit in 18x18 wheelchair even with decreased back support on chair. Manual w/c propulsion 2 x 150 ft with use of BUE at Supervision level. Provided pt with wheelchair gloves for improved grip on rims of chair during propulsion. Pt has incontinence of bowel while seated in w/c. Beezy board transfer back to bed with max A x 1 and mod A x 1 due to increased difficulty with transfer. Sit to supine mod A for BLE management. Pt is min A for rolling, dependent for pericare and brief change. Supine to sit max A. Beezy board transfer back to w/c. Seated balance performing ball toss while seated in w/c with occasional min A required to prevent anterior falling. Pt fatigues quickly while performing ball toss and attempting to maintain upright sitting balance. Pt again has incontinence of bowel.  Beezy board transfer back to bed. Sit to supine mod A. Rolling L/R with min A for dependent brief change and pericare. Cotreatment session with RT. Pt left semi-reclined in bed with needs in reach, wife present at end of session.   Therapy Documentation Precautions:  Precautions Precautions: Cervical, Back Restrictions Weight Bearing Restrictions: No    Therapy/Group: Individual Therapy   Excell Seltzer, PT, DPT  10/30/2019, 3:51 PM

## 2019-10-30 NOTE — Progress Notes (Signed)
Physical Therapy Session Note  Patient Details  Name: Ryan Reed MRN: [id number] Date of Birth: [birth year]  Today's Date: 10/30/2019 PT Individual Time: 1115-1200 PT Individual Time Calculation (min): 45 min   Short Term Goals: Week 2:  PT Short Term Goal 1 (Week 2): Pt will recall 3 pressure relief techniques and pressure relief schedule PT Short Term Goal 2 (Week 2): Pt will perform bed mobility with assist x 1 PT Short Term Goal 3 (Week 2): Pt will maintain sitting balance with min A x 5 min  Skilled Therapeutic Interventions/Progress Updates: Pt presented in w/c with nsg present setting up IV meds. Pt agreeable to therapy and stating some pain relief in groin after manual techniques yesterday. Pt then transported to day room for time management and performed [facility] board transfer to mat maxA x 2. Participated in sitting balance activities hands on lap/hands off lap with tactile cues for achieving and maintaining neutral position. Pt with physioball support behind pt and during rest breaks had pt perform shoulder circles, shrugs, and scap retractions WLP. Pt stating some relief from back tightness after activities. Pt then returned to w/c total A for set up and maxA x2 for transfer. Pt proplled back to room and performed [facility] board transfer to bed in same manner as prior. Once in bed pt stating thinks may have had BM, PTA checked and pt noted to have small BM. Pt performed rolling L/R minA as PTA doffed brief and performed peri-care total A. Nsg arrived to apply clean Mepilex due to previous one being soiled. Pt repositioned to comfort at end of session and left with call bell within reach and needs met.      Therapy Documentation Precautions:  Precautions Precautions: Cervical, Back Restrictions Weight Bearing Restrictions: No   Therapy/Group: Individual Therapy  Freddi Schrager  Shenia Alan, PTA  10/30/2019, 12:48 PM

## 2019-10-30 NOTE — Progress Notes (Signed)
Occupational Therapy Session Note  Patient Details  Name: Ryan Reed MRN: [id number] Date of Birth: June 27, 1951  Today's Date: 10/30/2019 OT Individual Time: 0845-1000 OT Individual Time Calculation (min): 75 min    Short Term Goals: Week 2:  OT Short Term Goal 1 (Week 2): Pt will dress LB with set-up bed level using AE PRN OT Short Term Goal 2 (Week 2): Pt will direct caregiver with set-up and technique of sliding board transfer independently OT Short Term Goal 3 (Week 2): Pt will consistently complete sliding board transfers with +1 assist in order to reduce caregiver burden OT Short Term Goal 4 (Week 2): Pt will be independent in directing care and initiating pressure relief schedule  Skilled Therapeutic Interventions/Progress Updates:    Pt seen for OT ADL bathing/dressing session. Pt awake in supine upon arrival, agreeable to tx session and denying pain. Pt requesting to complete UB bathing/dressing routine from w/c level at sink.  TED hose, ACE wraps and shoes donned total A bed level. He used hospital bed functions to sit in supported circle sit position to thread on pants with min A for clothing management. Returned to supine and rolled with min A to pull pants up.  Transferred to sitting EOB with mod A for management of B LEs, pt able to use bed rails to assist with bringing trunk upright with guarding assist. Completed max A beazy board transfer with max A +1 with +2 required to stabilize equipment and safety.  He completed UB bathing/dressing and grooming tasks from w/c level at sink with distant supervision.  Education/demonstration provided for pressure relief technique by boosting in w/c with +2 assist. Educated pt into how to direct caregiver with this pressure relief technique.  Together with nursing, worked on prep for self-cath from w/c level. Education and assist for proper positioning and considerations when cathing in chair. Pt successfully voided via cath from chair  with RN performing cath, will next work on pt self-cathing. This was his first time observing a full in/out cath be performed. Pt left seated in w/c at end of session, all needs in reach. Reviewed pressure relief schedule.   Therapy Documentation Precautions:  Precautions Precautions: Cervical, Back Restrictions Weight Bearing Restrictions: No   Therapy/Group: Individual Therapy  Brannon Levene L 10/30/2019, 6:56 AM

## 2019-10-30 NOTE — Progress Notes (Signed)
[facility] PHYSICAL MEDICINE & REHABILITATION PROGRESS NOTE   Subjective/Complaints: . Pt reports and his day nurse that had a great BM with bowel program last night- cath volumes also usually high, but worse at night- had some up to close to 1000cc overnight, but per day nurse, was going ~ 4.5 hours between caths.  Also discussed need for pt to start learning to cath himself.   ROS- denies SOB, CP, N/V/D, HA.  Objective:   No results found. Recent Labs    10/28/19 0351  WBC 7.0  HGB 8.4*  HCT 26.2*  PLT 443*   Recent Labs    10/28/19 0351  NA 136  K 4.0  CL 102  CO2 26  GLUCOSE 106*  BUN 10  CREATININE 0.57*  CALCIUM 8.3*    Intake/Output Summary (Last 24 hours) at 10/30/2019 1842 Last data filed at 10/30/2019 1836 Gross per 24 hour  Intake 840 ml  Output 2550 ml  Net -1710 ml     Physical Exam: Vital Signs Blood pressure 107/60, pulse 69, temperature (!) 97.4 F (36.3 C), resp. rate 18, SpO2 100 %.  Physical Exam Nursing noteand vitalsreviewed. Constitutional: awake, alert, appropriate; sitting up in bed; tearful only once; nurses x2 at bedside to discuss bowel program; NAD  HENT:  Head:Normocephalicand atraumatic.   Eyes:conjugate gaze Neck:Normal range of motion. CV: RRR  Respiratory:CTA B/L  HY:QMVH abdomen; (+)BS, NT, ND Musculoskeletal:  General: No deformity.  Comments: 1+ pedal edema bilaterally Neurological: He is alertand oriented to person, place, and time. Nocranial nerve deficit. Intact sensation in UE. Impaired sensation otherwise below chest. Biceps 5/5, triceps 5/5, WE 5/5; grip 4/5; finger abd 4/5 B/L. LE 0/5 except for perhaps some trace movement in toes. DTR's absent in LE still. Saw a few muscles spasms in LEs without volitional movement Skin: Skin iswarmand dry.No rashnoted. He is not diaphoretic.; foley out Noerythema. Pressure injury to buttocks and scrotum--blistered area on right buttock and on underside of  scrotum.  Now has pressure injury/DTI seen in middle- Stage II- near coccyx on L and R buttocks and surrounded by large area of shear injury on both sides with scuffed skin.    Assessment/Plan: 1. Functional deficits secondary to C7 quadriplegia/ ASIA B with neurogenic bowel and bladder which require 3+ hours per day of interdisciplinary therapy in a comprehensive inpatient rehab setting.  Physiatrist is providing close team supervision and 24 hour management of active medical problems listed below.  Physiatrist and rehab team continue to assess barriers to discharge/monitor patient progress toward functional and medical goals  Care Tool:  Bathing    Body parts bathed by patient: Right arm, Left arm, Chest, Face, Abdomen, Front perineal area, Right upper leg, Right lower leg, Left lower leg, Left upper leg, Buttocks   Body parts bathed by helper: Buttocks, Left upper leg, Left lower leg Body parts n/a: Front perineal area, Buttocks   Bathing assist Assist Level: Minimal Assistance - Patient > 75%     Upper Body Dressing/Undressing Upper body dressing   What is the patient wearing?: Pull over shirt    Upper body assist Assist Level: Set up assist    Lower Body Dressing/Undressing Lower body dressing      What is the patient wearing?: Pants, Incontinence brief     Lower body assist Assist for lower body dressing: Minimal Assistance - Patient > 75%     Toileting Toileting Toileting Activity did not occur (Clothing management and hygiene only): N/A (no void or  bm)  Toileting assist Assist for toileting: 2 Helpers     Transfers Chair/bed transfer  Transfers assist  Chair/bed transfer activity did not occur: Safety/medical concerns  Chair/bed transfer assist level: 2 Helpers     Locomotion Ambulation   Ambulation assist   Ambulation activity did not occur: Safety/medical concerns          Walk 10 feet activity   Assist  Walk 10 feet activity did not  occur: Safety/medical concerns        Walk 50 feet activity   Assist Walk 50 feet with 2 turns activity did not occur: Safety/medical concerns         Walk 150 feet activity   Assist Walk 150 feet activity did not occur: Safety/medical concerns         Walk 10 feet on uneven surface  activity   Assist Walk 10 feet on uneven surfaces activity did not occur: Safety/medical concerns         Wheelchair     Assist Will patient use wheelchair at discharge?: Yes Type of Wheelchair: Manual Wheelchair activity did not occur: Safety/medical concerns  Wheelchair assist level: Supervision/Verbal cueing Max wheelchair distance: 150'    Wheelchair 50 feet with 2 turns activity    Assist    Wheelchair 50 feet with 2 turns activity did not occur: Safety/medical concerns   Assist Level: Supervision/Verbal cueing   Wheelchair 150 feet activity     Assist  Wheelchair 150 feet activity did not occur: Safety/medical concerns   Assist Level: Supervision/Verbal cueing   Blood pressure 107/60, pulse 69, temperature (!) 97.4 F (36.3 C), resp. rate 18, SpO2 100 %.  Medical Problem List and Plan: 1.C7 Quadriplegia ASIA B and functional deficitssecondary to C7-T1 epidural abscess with associated paraplegia -patient maynot yetshower -ELOS/Goals: min assist PT/OT 28 days  12/2- will maintain on low air loss bed until Monday then reassess Stage II pressure ulcer he came with, patient wishes to switch to another bed he does not find current bed comfortable  12/7- change to regular bed; turn every 2 hours from side to side, since has wound on coccyx/L buttock.  12/8- turing q2 hours   12/3- PT doing ACE wraps/TEDs for orthostatic hypotension related to SCI- not using Abd binder 2. Antithrombotics: -DVT/anticoagulation:Eliquis 11/28- will try and get NSU to allow Lovenox due to  high risk of DVT in SCI patients.  11/29- has B/L gastroc  DVTs- started Apixiban 10 mg BID x 1 week then 5 mg BID for DVTs.  -antiplatelet therapy: N/A 3. Pain Management:does not like oxycodone due to SE--will change to ultram prn.Encouragedpatient to take muscle relaxers and Tylenol as needed 4. Mood:LCSW to follow for evaluation and support -antipsychotic agents: N/A 5. Neuropsych: This patientiscapable of making decisions onhisown behalf. 6. Skin/Wound Care:Routine pressure relief measureswith local care to sacrum. [place] RN consult as appropriate.  12/2- will con't low air loss bed unti Monday and then reassess- came with this Stage II to rehab  12/7- put back on regular bed and turn q2 hours.   12/8- turning well- and wearing PRAFOs to help feet/stop skin breakdown and prevent foot drop somewhat 7. Fluids/Electrolytes/Nutrition:Monitor I's/O. Check lytesin a.m. 8.Acute blood loss anemia: Recheck CBC in a.m.  11/28- Hb 7.8- might cause more problems with orthostatic hypotension- will need abd binder, TEDs, and possibly Midodrine. Will monitor.   11/29- no SCDs due to DVTs 9.PAF: Monitor heart rate 3 times daily. Continue metoprolol 25 mg twice daily 10. Neurogenic  bladder:  ICP volumes 600-741m- Nsg will need to increase freq to Q4h per orders  12/8- even at q4 hours, pt's volumes at night are 550-925cc- will d/w pt. 11.Neurogenic bowel: Reports p.o. intake has been poor due to change in taste---has been drinking a lot of smoothies. Discussed dietary needs and changes. Change senna to 2 every morning with suppositories in evening after supper for PM program. Start today. bowel program- evening after dinner. Between day and evening shift, needs to be done.  12/2- got bowel program- no results- will monitor closely; also, will get foley out today and start in/out cath program  12/3- will increase bowel meds today- if doesn't have BM tonight, will do mg citrate/enema tomorrow- added miralax and increased  senokot to 3 tabs in AM  12/4- ordered mg citrate and in 3 hours do tap water enema- should hopefully work.  12/6 incont mushy BM-large  12/7- need to do all dig stim so doesn't have accidents later on, like today.  1/28- bowel habit hasn't kicked in yet- still having incontinence  11/9- had great BM with bowel program last night- also d/w pt need to keep intake to 2L/day- and showed him what that meant- also d/w nursing that needs to start cathing himself- they will start today.  12. Leucocytosis in setting of Epidural abscess- on IV ABX x 8 weeks: Follow up CBC in am.  11/28- resolved leukocytosis- will monitor labs at least weekly-  121- ESR 82; CRP 11.6- will monitor weekly for now.  13. Hyperglycemia: Stress Induced hyperglycemia v/s IPG--will check Hgb A1C in am.  11/29- A1c 5.6 #14.  Normochromic normocytic anemia we will check stool guaiac given anticoagulants, start iron supplement  12/7- back up to 8.4- doing better/is recovering some        LOS: 12 days A FACE TO FACE EVALUATION WAS PERFORMED  Carisa Backhaus 10/30/2019, 6:42 PM

## 2019-10-31 ENCOUNTER — Inpatient Hospital Stay (HOSPITAL_COMMUNITY): Payer: Medicare Other | Admitting: Occupational Therapy

## 2019-10-31 ENCOUNTER — Inpatient Hospital Stay (HOSPITAL_COMMUNITY): Payer: Medicare Other | Admitting: Physical Therapy

## 2019-10-31 NOTE — Progress Notes (Signed)
Per oncoming report from nurse patient bowel program completed. Patient reports another small bowel movement shortly after shift change. Patient denies need for any further dig stimulation.

## 2019-10-31 NOTE — Progress Notes (Signed)
Bowel program done at 1800 today. This nurse preformed dig simulation x3 with loose medium bowel return. This Probation officer informed patient of bowel program been completed and patient understands. Adria Devon, LPN.

## 2019-10-31 NOTE — Progress Notes (Signed)
Social Work Patient ID: Ryan Reed, male   DOB: Mar 22, 1951, 68 y.o.   MRN: [id number]   Met with pt and wife yesterday to review team conference.  Both aware and agreeable with targeted d/c date of 12/23 and min/mod w/c level goals overall.  While pt out of room, wife shared that "reality hit him a few days ago...realizes this isn't going to be a couple of weeks and he'll be better...".  Wife concerned about his emotional status - will have neuropsychology meet with him again next week.  Pt continues to participate fully.  W/c seating with [place] today.  Both pt and wife are aware they will need to learn I/O cath process and very willing.  Will continue to follow.  Tenna Lacko, LCSW

## 2019-10-31 NOTE — Progress Notes (Addendum)
Occupational Therapy Weekly Progress Note  Patient Details  Name: Ryan Reed MRN: [id number] Date of Birth: 1951/10/08  Beginning of progress report period: October 23, 2019 End of progress report period: October 31, 2019  Today's Date: 10/31/2019 OT Individual Time: 3817-7116 and 1400-1500 OT Individual Time Calculation (min): 75 min and 60 min   Patient has met 2 of 4 short term goals.  Pt is making steady progress towards OT goals. He is completing LB bathing/dressing from bed level with min A for efficient rolling in order to pull pants up and complete buttock hygiene. UB bathing/dressing and grooming tasks from w/c level with set-up.  Trialing use of [place] board for transfers due to sheering wounds on buttock, requires +2 assist to effectively place board and stabilize equipment for basic sliding board transfers. He is able to direct care appropriate for transfers. He is completing pressure relief independently via lateral leans onto EOB. Have initiated education along with nursing for self-cathing from bed and w/c level. On-going education regarding SCI, continuum of care, and d/c planning. Plan to have pt's wife begin to attend therapies to initiate hands on training prior to d/c home. Pt remains very motivated and hard working during all tx sessions.   Patient continues to demonstrate the following deficits: muscle weakness and muscle paralysis, decreased cardiorespiratoy endurance,  and decreased sitting balance, decreased postural control and decreased balance strategies and therefore will continue to benefit from skilled OT intervention to enhance overall performance with BADL and Reduce care partner burden.  Patient progressing toward long term goals..  Continue plan of care.  OT Short Term Goals Week 2:  OT Short Term Goal 1 (Week 2): Pt will dress LB with set-up bed level using AE PRN OT Short Term Goal 1 - Progress (Week 2): Not met OT Short Term Goal 2 (Week 2): Pt  will direct caregiver with set-up and technique of sliding board transfer independently OT Short Term Goal 2 - Progress (Week 2): Met OT Short Term Goal 3 (Week 2): Pt will consistently complete sliding board transfers with +1 assist in order to reduce caregiver burden OT Short Term Goal 3 - Progress (Week 2): Not met OT Short Term Goal 4 (Week 2): Pt will be independent in directing care and initiating pressure relief schedule OT Short Term Goal 4 - Progress (Week 2): Met Week 3:  OT Short Term Goal 1 (Week 3): Pt will complete clothing management in prep for self-cathing independently OT Short Term Goal 2 (Week 3): Pt will consistently transfer w/c<>EOB with +1 assist in order to decrease caregiver burden OT Short Term Goal 3 (Week 3): Will initiate hands on training with pt's caregivers in prep for d/c OT Short Term Goal 4 (Week 3): Pt will don pants from bed level with set-up  Skilled Therapeutic Interventions/Progress Updates:    Session One: Pt seen for OT session fcusing on ADL re-training. Pt awake in supine upon arrival, denying pain and agreeable to tx session. Completed LB bathing/dressing from bed level. Pt with reports of tightness in L groin. Trigger point Massage provided. Performed LE stretching and ROM routine on R/L LE, pt tolerating and reports feeling better afterwards, stretch performed to hamstrings, hip flexors, and calves. He bathed B LEs with min A for positioing of LEs. Threaded pants with min A, rolled using hospital be dfunctions and min A to initaite roll with LE positioning in order for pt to pull pants up. Noted to have had incontinent small BM. Total  A for hygiene and donning new brief, new sacral pad applied.  Transferred to sitting EOB with max A. [name] board transfer to standard w/c with mod A with +2 to stabilize equipment.  Pt left sitting up in w/c at end of session at sink to complete grooming tasks, MD present completing AM Ryan Reed. Extensive education  throughout session regarding bowel/bladder program, toileting positioning and activity progression, importance of LE ROM and stretching and functional implications and d/c planning.   Session Two: Pt seen for OT session focusing on functional mobility, sitting balance and UE stretching/ROM and pain management. Pt awake in supine upon arrival, agreeable to tx session. Reports thinking he had had incontinent BM. Pt checked bed level and clean. Transferred to sitting EOB with max A. Completed [name] board transfer to w/c with +2 required to place and stabilize equipment, however, pt able to slide into chair with use of board and overall min A for balance and anterior weight shift.  Self-propelled w/c to therapy gym with supervision. Sliding board transfer to therapy mat, arm chair placed in front of pt to assist with pulling up in order to go up and over on sliding board to limit sheering, max A overall.  Addressed static sitting balance and posture while seated EOM. Guarding assist static sitting with very flexed posture and posterior pelvic tilt. High/low table placed in front of pt with UEs supported on table and pt able to achieve erect posture. Tolerating ~1-2 minutes at a time before requiring supported rest break.  Positioned in supine on mat. Completed TENS level 22 on L groin for pain management. R LE stretching/ROM performed simultaneously. Pt positioned in prone, rolling with min A on mat. Prolonged stretch in prone.  Pt returned to chair at end of session and returned to room. Left seated in w/c with all needs in reach.   Therapy Documentation Precautions:  Precautions Precautions: Cervical, Back Restrictions Weight Bearing Restrictions: No   Therapy/Group: Individual Therapy  Ryan Reed L 10/31/2019, 6:53 AM

## 2019-10-31 NOTE — Progress Notes (Signed)
Physical Therapy Session Note  Patient Details  Name: Ryan Reed MRN: [id number] Date of Birth: October 03, 1951  Today's Date: 10/31/2019 PT Individual Time: 1000-1100 PT Individual Time Calculation (min): 60 min   Short Term Goals: Week 2:  PT Short Term Goal 1 (Week 2): Pt will recall 3 pressure relief techniques and pressure relief schedule PT Short Term Goal 2 (Week 2): Pt will perform bed mobility with assist x 1 PT Short Term Goal 3 (Week 2): Pt will maintain sitting balance with min A x 5 min  Skilled Therapeutic Interventions/Progress Updates:    Pt received seated in w/c in room, agreeable to PT session. No complaints of pain this AM. Manual w/c propulsion x 150 ft with Supervision to therapy gym. Luz Brazen, ATP present for seating evaluation. Pt discusses manual wheelchair options with representative with regards to accessibility of his home and property, ease of transport, energy conservation, positioning, pressure relief, skin integrity etc. Representative and patient decide on rigid frame manual w/c with higher back for increased comfort and safety with trunk support. Patient to receive loaner wheelchair next week in order to practice w/c mobility with device prior to d/c home. Pt reports feeling lightheaded and "off" this AM, seated BP 101/64 while seated in w/c with use of thigh high TED hose and ACE wraps. Pt also reports feeling overwhelmed by upcoming d/c and everything needed to plan for at home. Provided emotional support to patient and will spend future sessions addressing concerns about upcoming d/c. [place] board transfer back to bed with assist x 2 for safety. Sit to supine mod A for BLE management. Pt left semi-reclined in bed with needs in reach at end of session.  Therapy Documentation Precautions:  Precautions Precautions: Cervical, Back Restrictions Weight Bearing Restrictions: No    Therapy/Group: Individual Therapy   Excell Seltzer, PT,  DPT  10/31/2019, 12:56 PM

## 2019-11-01 ENCOUNTER — Inpatient Hospital Stay (HOSPITAL_COMMUNITY): Payer: Medicare Other | Admitting: Occupational Therapy

## 2019-11-01 ENCOUNTER — Other Ambulatory Visit: Payer: Self-pay

## 2019-11-01 ENCOUNTER — Ambulatory Visit (HOSPITAL_COMMUNITY): Payer: Medicare Other | Admitting: *Deleted

## 2019-11-01 ENCOUNTER — Encounter (HOSPITAL_COMMUNITY): Payer: Self-pay | Admitting: Physical Medicine and Rehabilitation

## 2019-11-01 MED ORDER — MUSCLE RUB 10-15 % EX CREA
TOPICAL_CREAM | CUTANEOUS | Status: DC | PRN
  Filled 2019-11-01: qty 85

## 2019-11-01 NOTE — Plan of Care (Signed)
  Problem: Education: Goal: Ability to verbalize activity precautions or restrictions will improve Outcome: Progressing Goal: Knowledge of the prescribed therapeutic regimen will improve Outcome: Progressing Goal: Understanding of discharge needs will improve Outcome: Progressing   Problem: Activity: Goal: Ability to avoid complications of mobility impairment will improve Outcome: Progressing Goal: Ability to tolerate increased activity will improve Outcome: Progressing Goal: Will remain free from falls Outcome: Progressing   Problem: Bowel/Gastric: Goal: Gastrointestinal status for postoperative course will improve Outcome: Progressing   Problem: Clinical Measurements: Goal: Ability to maintain clinical measurements within normal limits will improve Outcome: Progressing Goal: Postoperative complications will be avoided or minimized Outcome: Progressing Goal: Diagnostic test results will improve Outcome: Progressing   Problem: Pain Management: Goal: Pain level will decrease Outcome: Progressing   Problem: Skin Integrity: Goal: Will show signs of wound healing Outcome: Progressing   Problem: Health Behavior/Discharge Planning: Goal: Identification of resources available to assist in meeting health care needs will improve Outcome: Progressing   Problem: Bladder/Genitourinary: Goal: Urinary functional status for postoperative course will improve Outcome: Progressing   Problem: Consults Goal: RH SPINAL CORD INJURY PATIENT EDUCATION Description:  See Patient Education module for education specifics.  Outcome: Progressing   Problem: Consults Goal: RH SPINAL CORD INJURY PATIENT EDUCATION Description:  See Patient Education module for education specifics.  Outcome: Progressing   Problem: SCI BOWEL ELIMINATION Goal: RH STG SCI MANAGE BOWEL PROGRAM W/ASSIST OR AS APPROPRIATE Description: STG SCI Manage bowel program w/assist or as appropriate. Mod Outcome: Progressing   Problem: SCI BLADDER ELIMINATION Goal: RH STG SCI MANAGE BLADDER PROGRAM W/ASSISTANCE Description: Patient will manage bladder with mod assist Outcome: Progressing   Problem: RH SKIN INTEGRITY Goal: RH STG MAINTAIN SKIN INTEGRITY WITH ASSISTANCE Description: STG Maintain Skin Integrity With Assistance. Mod Outcome: Progressing Goal: RH STG ABLE TO PERFORM INCISION/WOUND CARE W/ASSISTANCE Description: STG Able To Perform Incision/Wound Care With Assistance. Max Outcome: Progressing

## 2019-11-01 NOTE — Progress Notes (Signed)
Recreational Therapy Session Note  Patient Details  Name: Ryan Reed MRN: [id number] Date of Birth: Jul 09, 1951 Today's Date: 11/01/2019 Time:  [phone] Pain: c/o right hip & buttocks pain, numbness- requesting repositioning for releif ("get off my butt') Skilled Therapeutic Interventions/Progress Updates: Session focused on continued leisure assessment, discharge planning addressing pt concerns/questions, w/c mobility [place] board and slide board transfers during co-treat with PT.  Pt propels w/c throughout the unit with Supervision. Pt performed [place] board transfers x2 with min assist, stand by assist of a second person for safety.  Pt performed slide board transfer from w/c to bed with mod assist, stand by assist of a second person for safety.  When in the gym, pt lying on his stomach on the mat for stretching by PT, discussed him feeling overwhelmed with current status and discharge planning.  Pt shared that he did not want to be a burden on his wife/family but knows they will make it work.  Discussed his family support, decision to discharge to his home vs his son's (as previously thought/offered, therapy goals short term (CIR) and long term beyond CIR. Also discussed leisure interests and using them as a coping strategy as he transitions home.   Therapy/Group: Co-TreatmentSIMPSON,Danuel Felicetti 11/01/2019, 12:35 PM

## 2019-11-01 NOTE — Progress Notes (Signed)
[place] PHYSICAL MEDICINE & REHABILITATION PROGRESS NOTE   Subjective/Complaints: . Pt reports along with OT- successful bowel regimen last night-- got a lot of stool and also did rectal clear after OT found having stool this AM and got a lot of stool- discussed at length and decided to add rectal clear to regimen.  Cathing on own now- still needing q4 hours. Fitting and doing pressure relief in smaller w/c/manual chair- w/c fitting yesterday. Also OT asked for muscle rub for pt- ordered it.   ROS- denies SOB, CP, N/V/D, HA.  Objective:   No results found. No results for input(s): WBC, HGB, HCT, PLT in the last 72 hours. No results for input(s): NA, K, CL, CO2, GLUCOSE, BUN, CREATININE, CALCIUM in the last 72 hours.  Intake/Output Summary (Last 24 hours) at 11/01/2019 1744 Last data filed at 11/01/2019 1300 Gross per 24 hour  Intake 600 ml  Output 3150 ml  Net -2550 ml     Physical Exam: Vital Signs Blood pressure 112/64, pulse 68, temperature 98.5 F (36.9 C), resp. rate 18, height _0  (1.88 m), SpO2 99 %.  Physical Exam Nursing noteand vitalsreviewed. Constitutional: awake, alert, appropriate; sitting up in manual w/c- regular not high backed W/C- OT at side; did pressure relief x1 while there- appropriately; NAD  HENT:  Head:Normocephalicand atraumatic.   Eyes:conjugate gaze Neck:Normal range of motion. CV: RRR  Respiratory:CTA B/L  DJ:TTSV abdomen; (+)BS, NT, ND Musculoskeletal:  General: No deformity.  Comments: 1+ pedal edema bilaterally Neurological: He is alertand oriented to person, place, and time. Nocranial nerve deficit. Intact sensation in UE. Impaired sensation otherwise below chest. Biceps 5/5, triceps 5/5, WE 5/5; grip 4/5; finger abd 4/5 B/L. LE 0/5 except for perhaps some trace movement in toes. DTR's absent in LE still. Saw a few muscles spasms in LEs without volitional movement Skin: Skin iswarmand dry.No rashnoted. He is  not diaphoretic.; foley out Noerythema. Pressure injury to buttocks and scrotum--blistered area on right buttock and on underside of scrotum.  Now has pressure injury/DTI seen in middle- Stage II- near coccyx on L and R buttocks and surrounded by large area of shear injury on both sides with scuffed skin.    Assessment/Plan: 1. Functional deficits secondary to C7 quadriplegia/ ASIA B with neurogenic bowel and bladder which require 3+ hours per day of interdisciplinary therapy in a comprehensive inpatient rehab setting.  Physiatrist is providing close team supervision and 24 hour management of active medical problems listed below.  Physiatrist and rehab team continue to assess barriers to discharge/monitor patient progress toward functional and medical goals  Care Tool:  Bathing    Body parts bathed by patient: Right arm, Left arm, Chest, Face, Abdomen, Right upper leg, Right lower leg, Left lower leg, Left upper leg   Body parts bathed by helper: Buttocks, Front perineal area Body parts n/a: Front perineal area, Buttocks   Bathing assist Assist Level: Minimal Assistance - Patient > 75%     Upper Body Dressing/Undressing Upper body dressing   What is the patient wearing?: Pull over shirt    Upper body assist Assist Level: Independent    Lower Body Dressing/Undressing Lower body dressing      What is the patient wearing?: Pants, Incontinence brief     Lower body assist Assist for lower body dressing: Minimal Assistance - Patient > 75%(Assist for incontinent brief only)     Toileting Toileting Toileting Activity did not occur (Clothing management and hygiene only): N/A (no void or bm)  Toileting assist Assist for toileting: 2 Helpers     Transfers Chair/bed transfer  Transfers assist  Chair/bed transfer activity did not occur: Safety/medical concerns  Chair/bed transfer assist level: 2 Helpers     Locomotion Ambulation   Ambulation assist   Ambulation  activity did not occur: Safety/medical concerns          Walk 10 feet activity   Assist  Walk 10 feet activity did not occur: Safety/medical concerns        Walk 50 feet activity   Assist Walk 50 feet with 2 turns activity did not occur: Safety/medical concerns         Walk 150 feet activity   Assist Walk 150 feet activity did not occur: Safety/medical concerns         Walk 10 feet on uneven surface  activity   Assist Walk 10 feet on uneven surfaces activity did not occur: Safety/medical concerns         Wheelchair     Assist Will patient use wheelchair at discharge?: Yes Type of Wheelchair: Manual Wheelchair activity did not occur: Safety/medical concerns  Wheelchair assist level: Supervision/Verbal cueing Max wheelchair distance: 150'    Wheelchair 50 feet with 2 turns activity    Assist    Wheelchair 50 feet with 2 turns activity did not occur: Safety/medical concerns   Assist Level: Supervision/Verbal cueing   Wheelchair 150 feet activity     Assist  Wheelchair 150 feet activity did not occur: Safety/medical concerns   Assist Level: Supervision/Verbal cueing   Blood pressure 112/64, pulse 68, temperature 98.5 F (36.9 C), resp. rate 18, height _0  (1.88 m), SpO2 99 %.  Medical Problem List and Plan: 1.C7 Quadriplegia ASIA B and functional deficitssecondary to C7-T1 epidural abscess with associated paraplegia -patient maynot yetshower -ELOS/Goals: min assist PT/OT 28 days  12/2- will maintain on low air loss bed until Monday then reassess Stage II pressure ulcer he came with, patient wishes to switch to another bed he does not find current bed comfortable  12/7- change to regular bed; turn every 2 hours from side to side, since has wound on coccyx/L buttock.  12/8- turing q2 hours   12/3- PT doing ACE wraps/TEDs for orthostatic hypotension related to SCI- not using Abd binder 2.  Antithrombotics: -DVT/anticoagulation:Eliquis 11/28- will try and get NSU to allow Lovenox due to  high risk of DVT in SCI patients.  11/29- has B/L gastroc DVTs- started Apixiban 10 mg BID x 1 week then 5 mg BID for DVTs.  -antiplatelet therapy: N/A 3. Pain Management:does not like oxycodone due to SE--will change to ultram prn.Encouragedpatient to take muscle relaxers and Tylenol as needed  12/11- muscle rub added 4. Mood:LCSW to follow for evaluation and support -antipsychotic agents: N/A 5. Neuropsych: This patientiscapable of making decisions onhisown behalf. 6. Skin/Wound Care:Routine pressure relief measureswith local care to sacrum. [place] RN consult as appropriate.  12/2- will con't low air loss bed unti Monday and then reassess- came with this Stage II to rehab  12/7- put back on regular bed and turn q2 hours.   12/8- turning well- and wearing PRAFOs to help feet/stop skin breakdown and prevent foot drop somewhat 7. Fluids/Electrolytes/Nutrition:Monitor I's/O. Check lytesin a.m. 8.Acute blood loss anemia: Recheck CBC in a.m.  11/28- Hb 7.8- might cause more problems with orthostatic hypotension- will need abd binder, TEDs, and possibly Midodrine. Will monitor.   11/29- no SCDs due to DVTs 9.PAF: Monitor heart rate 3 times daily. Continue  metoprolol 25 mg twice daily 10. Neurogenic bladder:  ICP volumes 600-713m- Nsg will need to increase freq to Q4h per orders  12/8- even at q4 hours, pt's volumes at night are 550-925cc- will d/w pt.  12/11- pt doing own caths now- now not sure of volumes. 11.Neurogenic bowel: Reports p.o. intake has been poor due to change in taste---has been drinking a lot of smoothies. Discussed dietary needs and changes. Change senna to 2 every morning with suppositories in evening after supper for PM program. Start today. bowel program- evening after dinner. Between day and evening shift, needs to be  done.  12/2- got bowel program- no results- will monitor closely; also, will get foley out today and start in/out cath program  12/3- will increase bowel meds today- if doesn't have BM tonight, will do mg citrate/enema tomorrow- added miralax and increased senokot to 3 tabs in AM  12/4- ordered mg citrate and in 3 hours do tap water enema- should hopefully work.  12/6 incont mushy BM-large  12/7- need to do all dig stim so doesn't have accidents later on, like today.  1/28- bowel habit hasn't kicked in yet- still having incontinence  11/9- had great BM with bowel program last night- also d/w pt need to keep intake to 2L/day- and showed him what that meant- also d/w nursing that needs to start cathing himself- they will start today.   12/11- rectal clear added in AM 12. Leucocytosis in setting of Epidural abscess- on IV ABX x 8 weeks: Follow up CBC in am.  11/28- resolved leukocytosis- will monitor labs at least weekly-  121- ESR 82; CRP 11.6- will monitor weekly for now.  13. Hyperglycemia: Stress Induced hyperglycemia v/s IPG--will check Hgb A1C in am.  11/29- A1c 5.6 #14.  Normochromic normocytic anemia we will check stool guaiac given anticoagulants, start iron supplement  12/7- back up to 8.4- doing better/is recovering some        LOS: 14 days A FACE TO FACE EVALUATION WAS PERFORMED  Ryan Reed 11/01/2019, 5:44 PM

## 2019-11-01 NOTE — Progress Notes (Signed)
Physical Therapy Weekly Progress Note  Patient Details  Name: Ryan Reed MRN: [id number] Date of Birth: 05/14/1951  Beginning of progress report period: October 25, 2019 End of progress report period: November 01, 2019  Today's Date: 11/01/2019 PT Individual Time: 5400-8676 PT Individual Time Calculation (min): 75 min   Patient has met 3 of 3 short term goals.  Pt currently requires min A for rolling, max A for supine to sit, and mod A for sit to supine with use of hospital bed features and intermittent use of leg loops. Pt currently still requires assist x 2 for [facility] board transfers due to impaired trunk control and sitting balance required to perform transfer safely. Pt is at Supervision level for w/c mobility up to 150 ft with use of BUE and is independent with pressure relief via lateral leans while seated in w/c. Pt demos good understanding and performance of pressure relief schedule. This week patient completed a seating evaluation for a customized manual wheelchair.  Patient continues to demonstrate the following deficits muscle weakness, muscle joint tightness and muscle paralysis, abnormal tone and unbalanced muscle activation and decreased sitting balance, decreased postural control and decreased balance strategies and therefore will continue to benefit from skilled PT intervention to increase functional independence with mobility.  Patient progressing toward long term goals..  Continue plan of care.  PT Short Term Goals Week 2:  PT Short Term Goal 1 (Week 2): Pt will recall 3 pressure relief techniques and pressure relief schedule PT Short Term Goal 1 - Progress (Week 2): Met PT Short Term Goal 2 (Week 2): Pt will perform bed mobility with assist x 1 PT Short Term Goal 2 - Progress (Week 2): Met PT Short Term Goal 3 (Week 2): Pt will maintain sitting balance with min A x 5 min PT Short Term Goal 3 - Progress (Week 2): Met Week 3:  PT Short Term Goal 1 (Week 3): Pt will  perform least restrictive transfer with assist x 1 PT Short Term Goal 2 (Week 3): Pt will maintain sitting balance with SBA x 5 min PT Short Term Goal 3 (Week 3): Pt will demonstrate independence with LE PROM stretching program  Skilled Therapeutic Interventions/Progress Updates:    Pt received seated in w/c in room, agreeable to PT session. Pt reports new onset of numbness in L buttock region this date and requests to spend some time "off my butt". Pt reports he has been performing pressure relief and following pressure relief schedule via lateral leans. Manual w/c propulsion x 150 ft with use of BUE at Supervision level. Beezy board transfer w/c to mat table with min A x 1 and SBA x 1 for transfer. Sit to supine mod A for BLE management. Supine to sidelying to prone with min A for RLE management. Pt tolerates prone positioning x 15 min for prolonged stretch of hip flexors and anterior chest musculature. While in prone performed PROM to B quads/hamstrings and DF/PF 3-4 x 60 sec each to prevent contracture. Discussed pt's d/c plans as he now plans on d/c to his home vs staying with his son temporarily. Pt reports currently feeling overwhelmed thinking about d/c planning and doesn't want to feel like he is a burden on his wife. Pt shows good insight into current deficits and current assist level and what will be required of family upon d/c home. Discussed bowel and bladder program and what that looks like for patient's daily life as he progresses. Pt returns to supine with  min A for RLE management. Supine to sit with mod A for LE management. Beezy board transfer back to w/c with min A x 1, SBA x 1 for safety. Slide board transfer w/c to bed with mod A x 1 and SBA x 1. Sit to supine mod A for BLE management. Pt left semi-reclined in bed with needs in reach at end of session. NT in room to perform bladder scan. Cotreatment session with RT.  Therapy Documentation Precautions:  Precautions Precautions:  Cervical, Back Restrictions Weight Bearing Restrictions: No     Therapy/Group: Individual Therapy   Excell Seltzer, PT, DPT 11/01/2019, 4:16 PM

## 2019-11-01 NOTE — Progress Notes (Addendum)
Occupational Therapy Session Note  Patient Details  Name: Ryan Reed MRN: [id number] Date of Birth: 28-Oct-1951  Today's Date: 11/01/2019 OT Individual Time: [id number] and 1300-1345 OT Individual Time Calculation (min): 70 min and 45 min   Short Term Goals: Week 3:  OT Short Term Goal 1 (Week 3): Pt will complete clothing management in prep for self-cathing independently OT Short Term Goal 2 (Week 3): Pt will consistently transfer w/c<>EOB with +1 assist in order to decrease caregiver burden OT Short Term Goal 3 (Week 3): Will initiate hands on training with pt's caregivers in prep for d/c OT Short Term Goal 4 (Week 3): Pt will don pants from bed level with set-up  Skilled Therapeutic Interventions/Progress Updates:    Session One: Pt seen for OT ADL batihng/dressing session. Pt awake in supine upon arrival, agreeable to tx session and denying pain. LB bathing/dressing completed in supported circle sit position with set-up and VCs for problem solving. Able to reach B feet and thread pants with set-up. Returned to supine position to roll to pull pants up. Upon rolling, pt noted to have been incontinent of small amount of stool RN made aware and performed multiple trials of dig.stim. and pt with moderate amount of stool resulting. Total A +2 for hygiene and donning new brief. TED hose and ACE wraps donned total A. He donned shoes with mod A.  Transferred to sitting EOB with mod-max A +2 with HOB elevated. Completed +1 mod A [name] board transfer to standard w/c. Completed UB bathing/dressing from w/c level with set-up.Pt left seated in w/c at sink completing grooming tasks at end of session, all needs in reach.   Session Two: Pt seen for OT session focusing on functional transfers and sitting balance. Pt awake in supine upon arrival, agreeable to tx session.   Transfers: Supine>sitting EOB with mod A using hospital bed functions. [name] board transfers EOB>w/c, w/c<> EOM, and w/c>EOB.   Placed chair on opposite side of w/c from transfer for pt to lean into in order to facilitate in proper placement of [place] board, worked well and discussed use for d/c and set-up of home bedroom to allow for all DME. Completed at overall min-mod A for balance and stabilization of equipment.  Therapeutic Activity: Sitting balance EOM to toss horseshoes. Close guarding assist overall, pt using UEs to compensate with LOB episodes during dynamic tasks. Reclined rest breaks btwn trials. Position in supported erect sitting position for chest expansion.    Therapy Documentation Precautions:  Precautions Precautions: Cervical, Back Restrictions Weight Bearing Restrictions: No   Therapy/Group: Individual Therapy  [name] L 11/01/2019, 6:56 AM

## 2019-11-01 NOTE — Progress Notes (Signed)
Bowel program preformed during 7a-7pm shift. Dig stim preformed, patient has 3 small bowel movements. On coming nurse will informed of results and additional dig stim needed according to MD order. Adria Devon, LPN

## 2019-11-02 NOTE — Progress Notes (Signed)
At 2030, dig.stim for moderate mushy BM. At 2400, I & O cath-1100. Will check PVR at 4 hour mark vs 6 hour. At 0430, I & O cath=800cc's. Performing self cath with min. Assist from staff. At 0445, performed rectal check with moderate soft mushy stool. Dressing to sacrum changed. Ryan Reed

## 2019-11-02 NOTE — Progress Notes (Signed)
[place] PHYSICAL MEDICINE & REHABILITATION PROGRESS NOTE   Subjective/Complaints: . No results with BM last night. Still wants to work on it.   ROS: Patient denies fever, rash, sore throat, blurred vision, nausea, vomiting, diarrhea, cough, shortness of breath or chest pain, joint or back pain, headache, or mood change.   Objective:   No results found. No results for input(s): WBC, HGB, HCT, PLT in the last 72 hours. No results for input(s): NA, K, CL, CO2, GLUCOSE, BUN, CREATININE, CALCIUM in the last 72 hours.  Intake/Output Summary (Last 24 hours) at 11/02/2019 1211 Last data filed at 11/02/2019 1030 Gross per 24 hour  Intake 960 ml  Output 3350 ml  Net -2390 ml     Physical Exam: Vital Signs Blood pressure 115/67, pulse 72, temperature 98.1 F (36.7 C), resp. rate 18, height '6\' 2"'  (1.88 m), SpO2 100 %.  Physical Exam Constitutional: No distress . Vital signs reviewed. HEENT: EOMI, oral membranes moist Neck: supple Cardiovascular: RRR without murmur. No JVD    Respiratory: CTA Bilaterally without wheezes or rales. Normal effort    GI: BS +, non-tender, non-distended  Musculoskeletal:  General: No deformity.  Comments: tr pedal edema bilaterally Neurological: He is alertand oriented to person, place, and time. Nocranial nerve deficit. Intact sensation in UE. Impaired sensation otherwise below chest. Biceps 5/5, triceps 5/5, WE 5/5; grip 4/5; finger abd 4/5 B/L. LE 0/5 except for perhaps some trace movement in toes of RLE. DTR's absent in LE still.   Skin: Skin iswarmand dry.No rashnoted. He is not diaphoretic.; foley out Noerythema. Pressure injury to buttocks and scrotum--blistered area on right buttock and on underside of scrotum.--unchanged  Now has pressure injury/DTI seen in middle- Stage II- near coccyx on L and R buttocks and surrounded by large area of shear injury on both sides with scuffed skin.    Assessment/Plan: 1. Functional  deficits secondary to C7 quadriplegia/ ASIA B with neurogenic bowel and bladder which require 3+ hours per day of interdisciplinary therapy in a comprehensive inpatient rehab setting.  Physiatrist is providing close team supervision and 24 hour management of active medical problems listed below.  Physiatrist and rehab team continue to assess barriers to discharge/monitor patient progress toward functional and medical goals  Care Tool:  Bathing    Body parts bathed by patient: Right arm, Left arm, Chest, Face, Abdomen, Right upper leg, Right lower leg, Left lower leg, Left upper leg   Body parts bathed by helper: Buttocks, Front perineal area Body parts n/a: Front perineal area, Buttocks   Bathing assist Assist Level: Minimal Assistance - Patient > 75%     Upper Body Dressing/Undressing Upper body dressing   What is the patient wearing?: Pull over shirt    Upper body assist Assist Level: Independent    Lower Body Dressing/Undressing Lower body dressing      What is the patient wearing?: Pants, Incontinence brief     Lower body assist Assist for lower body dressing: Minimal Assistance - Patient > 75%(Assist for incontinent brief only)     Toileting Toileting Toileting Activity did not occur (Clothing management and hygiene only): N/A (no void or bm)  Toileting assist Assist for toileting: 2 Helpers     Transfers Chair/bed transfer  Transfers assist  Chair/bed transfer activity did not occur: Safety/medical concerns  Chair/bed transfer assist level: 2 Helpers     Locomotion Ambulation   Ambulation assist   Ambulation activity did not occur: Safety/medical concerns  Walk 10 feet activity   Assist  Walk 10 feet activity did not occur: Safety/medical concerns        Walk 50 feet activity   Assist Walk 50 feet with 2 turns activity did not occur: Safety/medical concerns         Walk 150 feet activity   Assist Walk 150 feet activity  did not occur: Safety/medical concerns         Walk 10 feet on uneven surface  activity   Assist Walk 10 feet on uneven surfaces activity did not occur: Safety/medical concerns         Wheelchair     Assist Will patient use wheelchair at discharge?: Yes Type of Wheelchair: Manual Wheelchair activity did not occur: Safety/medical concerns  Wheelchair assist level: Supervision/Verbal cueing Max wheelchair distance: 150'    Wheelchair 50 feet with 2 turns activity    Assist    Wheelchair 50 feet with 2 turns activity did not occur: Safety/medical concerns   Assist Level: Supervision/Verbal cueing   Wheelchair 150 feet activity     Assist  Wheelchair 150 feet activity did not occur: Safety/medical concerns   Assist Level: Supervision/Verbal cueing   Blood pressure 115/67, pulse 72, temperature 98.1 F (36.7 C), resp. rate 18, height '6\' 2"'  (1.88 m), SpO2 100 %.  Medical Problem List and Plan: 1.C7 Quadriplegia ASIA B and functional deficitssecondary to C7-T1 epidural abscess with associated paraplegia -patient maynot yetshower -ELOS/Goals: min assist PT/OT 28 days  12/2- will maintain on low air loss bed until Monday then reassess Stage II pressure ulcer he came with, patient wishes to switch to another bed he does not find current bed comfortable  12/7- change to regular bed; turn every 2 hours from side to side, since has wound on coccyx/L buttock.  12/8- turing q2 hours   12/3- PT doing ACE wraps/TEDs for orthostatic hypotension related to SCI- not using Abd binder 2. Antithrombotics: -DVT/anticoagulation:Eliquis 11/28- will try and get NSU to allow Lovenox due to  high risk of DVT in SCI patients.  11/29- has B/L gastroc DVTs- started Apixiban 10 mg BID x 1 week then 5 mg BID for DVTs.  -antiplatelet therapy: N/A 3. Pain Management:does not like oxycodone due to SE--will change to ultram  prn.Encouragedpatient to take muscle relaxers and Tylenol as needed  12/11- muscle rub added 4. Mood:LCSW to follow for evaluation and support -antipsychotic agents: N/A 5. Neuropsych: This patientiscapable of making decisions onhisown behalf. 6. Skin/Wound Care:Routine pressure relief measureswith local care to sacrum. [place] RN consult as appropriate.  12/2- will con't low air loss bed unti Monday and then reassess- came with this Stage II to rehab  12/7- put back on regular bed and turn q2 hours.   12/8- turning well- and wearing PRAFOs to help feet/stop skin breakdown and prevent foot drop somewhat 7. Fluids/Electrolytes/Nutrition:Monitor I's/O. good po. 8.Acute blood loss anemia: Recheck CBC in a.m.  11/28- Hb 7.8- might cause more problems with orthostatic hypotension- will need abd binder, TEDs, and possibly Midodrine. Will monitor.   11/29- no SCDs due to DVTs 9.PAF: Monitor heart rate 3 times daily. Continue metoprolol 25 mg twice daily 10. Neurogenic bladder:  ICP volumes 600-71m- Nsg will need to increase freq to Q4h per orders  12/8- even at q4 hours, pt's volumes at night are 550-925cc- will d/w pt.  12/11- pt doing own caths now- now not sure of volumes. 11.Neurogenic bowel: Reports p.o. intake has been poor due to change in taste---has  been drinking a lot of smoothies. Discussed dietary needs and changes. Change senna to 2 every morning with suppositories in evening after supper for PM program. Start today. bowel program- evening after dinner. Between day and evening shift, needs to be done.  12/2- got bowel program- no results- will monitor closely; also, will get foley out today and start in/out cath program   12/3- will increase bowel meds today- if doesn't have BM tonight, will do mg citrate/enema tomorrow- added miralax and increased senokot to 3 tabs in AM  12/4- ordered mg citrate and in 3 hours do tap water enema- should hopefully work.   12/6 incont mushy BM-large  12/7- need to do all dig stim so doesn't have accidents later on, like today.  1/28- bowel habit hasn't kicked in yet- still having incontinence  11/9- had great BM with bowel program last night- also d/w pt need to keep intake to 2L/day- and showed him what that meant- also d/w nursing that needs to start cathing himself- they will start today.   12/11- rectal clear added in AM  -12/12 not much result last night--stick with current program 12. Leucocytosis in setting of Epidural abscess- on IV ABX x 8 weeks: Follow up CBC in am.  11/28- resolved leukocytosis- will monitor labs at least weekly-  121- ESR 82; CRP 11.6- will monitor weekly for now.  13. Hyperglycemia: Stress Induced hyperglycemia v/s IPG--will check Hgb A1C in am.  11/29- A1c 5.6 #14.  Normochromic normocytic anemia we will check stool guaiac given anticoagulants, start iron supplement  12/7- back up to 8.4- doing better/is recovering some        LOS: 15 days A FACE TO FACE EVALUATION WAS PERFORMED  Meredith Staggers 11/02/2019, 12:11 PM

## 2019-11-03 ENCOUNTER — Inpatient Hospital Stay (HOSPITAL_COMMUNITY): Payer: Medicare Other

## 2019-11-03 NOTE — Progress Notes (Signed)
Bowel program: suppository administered, with dig stim performed prior to clear rectal vault. Stool very soft and brown but still sticky, minimal amount of stool removed. After suppository, (approx 62min) dig stim performed, no results. Stool felt but unable to remove. Will inform on coming shift.

## 2019-11-03 NOTE — Progress Notes (Signed)
[place] PHYSICAL MEDICINE & REHABILITATION PROGRESS NOTE   Subjective/Complaints: . Cath volumes a little high. Had results with bowel program but stool mushy by report .  ROS: Patient denies fever, rash, sore throat, blurred vision, nausea, vomiting, diarrhea, cough, shortness of breath or chest pain, joint or back pain, headache, or mood change.   Objective:   No results found. No results for input(s): WBC, HGB, HCT, PLT in the last 72 hours. No results for input(s): NA, K, CL, CO2, GLUCOSE, BUN, CREATININE, CALCIUM in the last 72 hours.  Intake/Output Summary (Last 24 hours) at 11/03/2019 1047 Last data filed at 11/03/2019 0949 Gross per 24 hour  Intake 762 ml  Output 2775 ml  Net -2013 ml     Physical Exam: Vital Signs Blood pressure 113/75, pulse 68, temperature 98.7 F (37.1 C), temperature source Oral, resp. rate 18, height '6\' 2"'  (1.88 m), SpO2 100 %.  Physical Exam Constitutional: No distress . Vital signs reviewed. HEENT: EOMI, oral membranes moist Neck: supple Cardiovascular: RRR without murmur. No JVD    Respiratory: CTA Bilaterally without wheezes or rales. Normal effort    GI: BS +, non-tender, non-distended  Musculoskeletal:  General: No deformity.  Comments: tr pedal edema bilaterally Neurological: He is alertand oriented to person, place, and time. Nocranial nerve deficit. Intact sensation in UE. Impaired sensation otherwise below chest. Biceps 5/5, triceps 5/5, WE 5/5; grip 4/5; finger abd 4/5 B/L. LLE tr to  0/5  RLE: 1-2/5. DTR's absent in LE still.   Skin: Skin iswarmand dry.No rashnoted. He is not diaphoretic.; foley out Noerythema. Pressure injury to buttocks and scrotum--blistered area on right buttock and on underside of scrotum.--not visualized today  Now has pressure injury/DTI seen in middle- Stage II- near coccyx on L and R buttocks and surrounded by large area of shear injury on both sides with scuffed skin.     Assessment/Plan: 1. Functional deficits secondary to C7 quadriplegia/ ASIA B with neurogenic bowel and bladder which require 3+ hours per day of interdisciplinary therapy in a comprehensive inpatient rehab setting.  Physiatrist is providing close team supervision and 24 hour management of active medical problems listed below.  Physiatrist and rehab team continue to assess barriers to discharge/monitor patient progress toward functional and medical goals  Care Tool:  Bathing    Body parts bathed by patient: Right arm, Left arm, Chest, Face, Abdomen, Right upper leg, Right lower leg, Left lower leg, Left upper leg   Body parts bathed by helper: Buttocks, Front perineal area Body parts n/a: Front perineal area, Buttocks   Bathing assist Assist Level: Minimal Assistance - Patient > 75%     Upper Body Dressing/Undressing Upper body dressing   What is the patient wearing?: Pull over shirt    Upper body assist Assist Level: Independent    Lower Body Dressing/Undressing Lower body dressing      What is the patient wearing?: Pants, Incontinence brief     Lower body assist Assist for lower body dressing: Minimal Assistance - Patient > 75%(Assist for incontinent brief only)     Toileting Toileting Toileting Activity did not occur (Clothing management and hygiene only): N/A (no void or bm)  Toileting assist Assist for toileting: 2 Helpers     Transfers Chair/bed transfer  Transfers assist  Chair/bed transfer activity did not occur: Safety/medical concerns  Chair/bed transfer assist level: 2 Helpers     Locomotion Ambulation   Ambulation assist   Ambulation activity did not occur: Safety/medical concerns  Walk 10 feet activity   Assist  Walk 10 feet activity did not occur: Safety/medical concerns        Walk 50 feet activity   Assist Walk 50 feet with 2 turns activity did not occur: Safety/medical concerns         Walk 150 feet  activity   Assist Walk 150 feet activity did not occur: Safety/medical concerns         Walk 10 feet on uneven surface  activity   Assist Walk 10 feet on uneven surfaces activity did not occur: Safety/medical concerns         Wheelchair     Assist Will patient use wheelchair at discharge?: Yes Type of Wheelchair: Manual Wheelchair activity did not occur: Safety/medical concerns  Wheelchair assist level: Supervision/Verbal cueing Max wheelchair distance: 150'    Wheelchair 50 feet with 2 turns activity    Assist    Wheelchair 50 feet with 2 turns activity did not occur: Safety/medical concerns   Assist Level: Supervision/Verbal cueing   Wheelchair 150 feet activity     Assist  Wheelchair 150 feet activity did not occur: Safety/medical concerns   Assist Level: Supervision/Verbal cueing   Blood pressure 113/75, pulse 68, temperature 98.7 F (37.1 C), temperature source Oral, resp. rate 18, height '6\' 2"'  (1.88 m), SpO2 100 %.  Medical Problem List and Plan: 1.C7 Quadriplegia ASIA B and functional deficitssecondary to C7-T1 epidural abscess with associated paraplegia -patient maynot yetshower -ELOS/Goals: min assist PT/OT 28 days  12/2- will maintain on low air loss bed until Monday then reassess Stage II pressure ulcer he came with, patient wishes to switch to another bed he does not find current bed comfortable  12/7- change to regular bed; turn every 2 hours from side to side, since has wound on coccyx/L buttock.  12/8- turing q2 hours   12/3- PT doing ACE wraps/TEDs for orthostatic hypotension related to SCI- not using Abd binder 2. Antithrombotics: -DVT/anticoagulation:Eliquis 11/28- will try and get NSU to allow Lovenox due to  high risk of DVT in SCI patients.  11/29- has B/L gastroc DVTs- started Apixiban 10 mg BID x 1 week then 5 mg BID for DVTs.  -antiplatelet therapy: N/A 3. Pain Management:does not  like oxycodone due to SE--will change to ultram prn.Encouragedpatient to take muscle relaxers and Tylenol as needed  12/11- muscle rub added 4. Mood:LCSW to follow for evaluation and support -antipsychotic agents: N/A 5. Neuropsych: This patientiscapable of making decisions onhisown behalf. 6. Skin/Wound Care:Routine pressure relief measureswith local care to sacrum. [place] RN consult as appropriate.  12/2- will con't low air loss bed unti Monday and then reassess- came with this Stage II to rehab  12/7- put back on regular bed and turn q2 hours.   12/8- turning well- and wearing PRAFOs to help feet/stop skin breakdown and prevent foot drop somewhat 7. Fluids/Electrolytes/Nutrition:Monitor I's/O. good po. 8.Acute blood loss anemia: Recheck CBC in a.m.  11/28- Hb 7.8- might cause more problems with orthostatic hypotension- will need abd binder, TEDs, and possibly Midodrine. Will monitor.   11/29- no SCDs due to DVTs 9.PAF: Monitor heart rate 3 times daily. Continue metoprolol 25 mg twice daily 10. Neurogenic bladder:  ICP volumes 600-766m- Nsg will need to increase freq to Q4h per orders  12/8- even at q4 hours, pt's volumes at night are 550-925cc- will d/w pt.  12/13 pt doing caths, volumes a little high. Adjust frequency accordingly 11.Neurogenic bowel: Reports p.o. intake has been poor due to  change in taste---has been drinking a lot of smoothies. Discussed dietary needs and changes. Change senna to 2 every morning with suppositories in evening after supper for PM program.   12/2- got bowel program- no results- will monitor closely; also, will get foley out today and start in/out cath program   12/3- will increase bowel meds today- if doesn't have BM tonight, will do mg citrate/enema tomorrow- added miralax and increased senokot to 3 tabs in AM  12/4- ordered mg citrate and in 3 hours do tap water enema- should hopefully work.  12/6 incont mushy  BM-large  12/7- need to do all dig stim so doesn't have accidents later on, like today.  1/28- bowel habit hasn't kicked in yet- still having incontinence  11/9- had great BM with bowel program last night- also d/w pt need to keep intake to 2L/day- and showed him what that meant- also d/w nursing that needs to start cathing himself- they will start today.   12/11- rectal clear added in AM  -12/13 results last night, stool too soft though, hold senna and continue miralax   -abx component 12. Leucocytosis in setting of Epidural abscess- on IV ABX x 8 weeks: Follow up CBC in am.  11/28- resolved leukocytosis- will monitor labs at least weekly-  121- ESR 82; CRP 11.6- will monitor weekly for now.  13. Hyperglycemia: Stress Induced hyperglycemia v/s IPG--will check Hgb A1C in am.  11/29- A1c 5.6 #14.  Normochromic normocytic anemia we will check stool guaiac given anticoagulants, start iron supplement  12/7- back up to 8.4- doing better/is recovering some        LOS: 16 days A FACE TO FACE EVALUATION WAS PERFORMED  Meredith Staggers 11/03/2019, 10:47 AM

## 2019-11-03 NOTE — Progress Notes (Signed)
Cath volumes better, but still high-1939-500cc's; 0115-700, and 0015-700cc's and 0520 cath=575cc's. Dig stim at 2015 with moderate mushy stool, no accidents during night. Ryan Reed A

## 2019-11-03 NOTE — Progress Notes (Signed)
Occupational Therapy Session Note  Patient Details  Name: Ryan Reed MRN: [id number] Date of Birth: December 06, 1950  Today's Date: 11/03/2019 OT Individual Time: 1100-1200 OT Individual Time Calculation (min): 60 min    Short Term Goals: Week 1:  OT Short Term Goal 1 (Week 1): Pt will maintain sitting balance at EOB for 5 minutes with max A of 1 OT Short Term Goal 1 - Progress (Week 1): Met OT Short Term Goal 2 (Week 1): Pt will complete slideboard transfer with max A +2 OT Short Term Goal 2 - Progress (Week 1): Met OT Short Term Goal 3 (Week 1): Pt will complete UB bathing sittting in wc at the sink with min A OT Short Term Goal 3 - Progress (Week 1): Met  Skilled Therapeutic Interventions/Progress Updates:    1;1. Pt received in bed agreeable to OT with no pain. Pt agreeable to transfering onto roll in shower chair for RN to complete dig stim to empty bowels. Pt washes feet and dons socks with set up while OT retrieves shower chair for back support. Pt completes supine>sitting with A for LE mangement and trunk elevation. Pt completes beezy board transfer with min-mod A EOB<>shower chair/w/c. Pt able to void bowel with digstim and returns to bed for LB dressing with CGA for rolling. OT dons teds and ace wraps. Pt washes UB at sink in w/c with set up and OT exits with pt seated in w/c, call light in reach and all need smet  Therapy Documentation Precautions:  Precautions Precautions: Cervical, Back Restrictions Weight Bearing Restrictions: No General:   Vital Signs: Therapy Vitals Temp: 98.7 F (37.1 C) Temp Source: Oral Pulse Rate: 68 BP: 113/75 Patient Position (if appropriate): Lying Oxygen Therapy SpO2: 100 % O2 Device: Room Air Pain:   ADL: ADL Eating: Set up Grooming: Setup Upper Body Bathing: Minimal assistance, Moderate assistance Lower Body Bathing: Maximal assistance, Dependent Upper Body Dressing: Minimal assistance, Moderate assistance Lower Body  Dressing: Dependent Toileting: Dependent Toilet Transfer: Unable to assess Tub/Shower Transfer: Unable to assess Vision   Perception    Praxis   Exercises:   Other Treatments:     Therapy/Group: Individual Therapy  Tonny Branch 11/03/2019, 7:00 AM

## 2019-11-03 NOTE — Progress Notes (Signed)
During therapy session had pt move to bath chair. Performed dig stim while pt up right to see if could get pt to empty. Performed dig stim x 4 with mod amount of brown sticky stool.  Pt reported feeling much better.

## 2019-11-04 ENCOUNTER — Inpatient Hospital Stay (HOSPITAL_COMMUNITY): Payer: Medicare Other

## 2019-11-04 ENCOUNTER — Encounter (HOSPITAL_COMMUNITY): Payer: Medicare Other | Admitting: Psychology

## 2019-11-04 ENCOUNTER — Inpatient Hospital Stay (HOSPITAL_COMMUNITY): Payer: Medicare Other | Admitting: Occupational Therapy

## 2019-11-04 LAB — CBC WITH DIFFERENTIAL/PLATELET
Abs Immature Granulocytes: 0.03 10*3/uL (ref 0.00–0.07)
Basophils Absolute: 0.1 10*3/uL (ref 0.0–0.1)
Basophils Relative: 1 %
Eosinophils Absolute: 0.3 10*3/uL (ref 0.0–0.5)
Eosinophils Relative: 5 %
HCT: 28.8 % — ABNORMAL LOW (ref 39.0–52.0)
Hemoglobin: 9.1 g/dL — ABNORMAL LOW (ref 13.0–17.0)
Immature Granulocytes: 1 %
Lymphocytes Relative: 18 %
Lymphs Abs: 0.9 10*3/uL (ref 0.7–4.0)
MCH: 28.7 pg (ref 26.0–34.0)
MCHC: 31.6 g/dL (ref 30.0–36.0)
MCV: 90.9 fL (ref 80.0–100.0)
Monocytes Absolute: 0.4 10*3/uL (ref 0.1–1.0)
Monocytes Relative: 7 %
Neutro Abs: 3.6 10*3/uL (ref 1.7–7.7)
Neutrophils Relative %: 68 %
Platelets: 438 10*3/uL — ABNORMAL HIGH (ref 150–400)
RBC: 3.17 MIL/uL — ABNORMAL LOW (ref 4.22–5.81)
RDW: 12.6 % (ref 11.5–15.5)
WBC: 5.2 10*3/uL (ref 4.0–10.5)
nRBC: 0 % (ref 0.0–0.2)

## 2019-11-04 LAB — BASIC METABOLIC PANEL
Anion gap: 10 (ref 5–15)
BUN: 10 mg/dL (ref 8–23)
CO2: 25 mmol/L (ref 22–32)
Calcium: 8.4 mg/dL — ABNORMAL LOW (ref 8.9–10.3)
Chloride: 103 mmol/L (ref 98–111)
Creatinine, Ser: 0.54 mg/dL — ABNORMAL LOW (ref 0.61–1.24)
GFR calc Af Amer: >60 mL/min (ref >60)
GFR calc non Af Amer: >60 mL/min (ref >60)
Glucose, Bld: 102 mg/dL — ABNORMAL HIGH (ref 70–99)
Potassium: 3.9 mmol/L (ref 3.5–5.1)
Sodium: 138 mmol/L (ref 135–145)

## 2019-11-04 LAB — SEDIMENTATION RATE: Sed Rate: 88 mm/hr — ABNORMAL HIGH (ref 0–16)

## 2019-11-04 LAB — C-REACTIVE PROTEIN: CRP: 1.1 mg/dL — ABNORMAL HIGH (ref <1.0)

## 2019-11-04 NOTE — Progress Notes (Signed)
Pt was woke up for bladder scan/in-out cath. In/out cath performed by the pt. Pt also requested not to be waken up Q2h for repositioning so he could get sleep. Pt will be reposition q4h w/bladder scan.

## 2019-11-04 NOTE — Progress Notes (Signed)
Orthopedic Tech Progress Note Patient Details:  Ryan Reed [birth year] [id number] Patient was with therapy at the time I came to apply [place], therapist said she would take care of that for me. Ortho Devices Type of Ortho Device: Abdominal binder Ortho Device/Splint Location: stomach Ortho Device/Splint Interventions: Other (comment)   Post Interventions Patient Tolerated: Other (comment) Instructions Provided: Other (comment)   Janit Pagan 11/04/2019, 9:37 AM

## 2019-11-04 NOTE — Progress Notes (Signed)
Bowel program was started on 7a-7pm at 6pm. Patient was dig stimulated multiple times according to order and small loose bowel movement achieved. This Probation officer informed the next on coming nurse that additional dig stim may be needed according to order and results from earlier. Adria Devon, LPN

## 2019-11-04 NOTE — Progress Notes (Signed)
Occupational Therapy Session Note  Patient Details  Name: Ryan Reed MRN: [id number] Date of Birth: May 30, 1951  Today's Date: 11/04/2019 OT Individual Time: 0700-0758 OT Individual Time Calculation (min): 58 min    Short Term Goals: Week 3:  OT Short Term Goal 1 (Week 3): Pt will complete clothing management in prep for self-cathing independently OT Short Term Goal 2 (Week 3): Pt will consistently transfer w/c<>EOB with +1 assist in order to decrease caregiver burden OT Short Term Goal 3 (Week 3): Will initiate hands on training with pt's caregivers in prep for d/c OT Short Term Goal 4 (Week 3): Pt will don pants from bed level with set-up  Skilled Therapeutic Interventions/Progress Updates:    Upon entering the room, pt supine in bed and reports having just finished breakfast. Pt is agreeable to OT intervention with no c/o pain this session. Pt bathing LB with set up A and pt utilized long sitting and circle sitting position with hospital bed providing support. Pt able to thread shorts this session and rolling with min A as he pulled over B hips. OT checking brief and pt incontinent of BM with total A to change and hygiene secondary to time management. Pt able to perform circle sitting and don B shoes. Pt needing assistance to thread TED hose, ACE wraps, and tie shoe laces this session. Supine >sit with mod A to EOB. Pt with min - mod A dynamic sitting balance and therapist placing beasy board. Pt transferred to the L with mod A into wheelchair. Pt propelled chair to sink for UB self care and grooming tasks at mod I level as pt able to obtain all needed items from wheelchair level. Call bell and all needed items within reach upon exiting the room.   Therapy Documentation Precautions:  Precautions Precautions: Cervical, Back Restrictions Weight Bearing Restrictions: No General:   Vital Signs:   Pain:   ADL: ADL Eating: Set up Grooming: Setup Upper Body Bathing: Minimal  assistance, Moderate assistance Lower Body Bathing: Maximal assistance, Dependent Upper Body Dressing: Minimal assistance, Moderate assistance Lower Body Dressing: Dependent Toileting: Dependent Toilet Transfer: Unable to assess Tub/Shower Transfer: Unable to assess Other Treatments:     Therapy/Group: Individual Therapy  Gypsy Decant 11/04/2019, 9:52 AM

## 2019-11-04 NOTE — Progress Notes (Signed)
Physical Therapy Session Note  Patient Details  Name: Ryan Reed MRN: [id number] Date of Birth: [birth year]  Today's Date: 11/04/2019 PT Individual Time: 0900-1000 PT Individual Time Calculation (min): 60 min   Short Term Goals: Week 3:  PT Short Term Goal 1 (Week 3): Pt will perform least restrictive transfer with assist x 1 PT Short Term Goal 2 (Week 3): Pt will maintain sitting balance with SBA x 5 min PT Short Term Goal 3 (Week 3): Pt will demonstrate independence with LE PROM stretching program  Skilled Therapeutic Interventions/Progress Updates:    Pt received seated in w/c in room, agreeable to PT session. No complaints of pain but does report numbness throughout buttocks and feeling uncomfortable sitting up in w/c for extended periods of time even following pressure relief schedule. Provided pt with back support for w/c at end of session to see if it improves tolerance for sitting up in chair. Pt is able to perform manual w/c propulsion up to 150 ft with use of BUE at Supervision to mod I level. Reviewed how to safely setup transfers w/c to/from mat table. Pt requires min cueing for safe w/c setup and sequencing of transfer. Pt is min A overall for slide board transfers w/c to/from level mat table and demonstrates good ability to clear buttocks during transfer. Reviewed placement of slide board and overall body positioning during transfer. Pt is able to maintain sitting balance EOB with SBA with use of BUE support and CGA with no UE support. Pt performs 4 slide board transfers going left and right w/c to/from mat table. Pt reports feeling lightheaded following final transfer. Seated BP 102/67, lightheadedness subsides with seated rest break. Reviewed importance of BP checks and BP regulation in patients with SCI. Pt reports episode of AD yesterday and reports he felt "claustraphobic" and felt "hot and sweaty". Pt reports realizing he was having an episode of autonomic dysreflexia and  followed the handout he has been provided with and sat himself up. Also discussed importance of letting RN know right away next time he experiences it. Slide board transfer back to bed with min A. Sit to supine mod A for BLE management. Supine B hip PROM in available planes of motion due to pt reporting stiffness in B hips, reports improvement following PROM. Transitioned to long-sitting position with use of bedrails, pt able to perform B hip ER, attempt to perform B ankle PROM but unable to maintain sitting balance and perform stretch adequately. Pt left seated in bed in long-sitting position with needs in reach at end of session.  Therapy Documentation Precautions:  Precautions Precautions: Cervical, Back Restrictions Weight Bearing Restrictions: No    Therapy/Group: Individual Therapy   Excell Seltzer, PT, DPT  11/04/2019, 12:48 PM

## 2019-11-04 NOTE — Progress Notes (Signed)
[place] PHYSICAL MEDICINE & REHABILITATION PROGRESS NOTE   Subjective/Complaints: . Cath volumes still high overnight- 700cc last night- pt doing himself.  Doing own pressure relief in w/c- says it's "easy to do" because w/c "uncomfortable". Had episode yesterday like AD_ sweating, SOB- resolved with fan use- they checked BP AFTERWARDS- BP nml. BM with bowel program last night- and rectal clear this AM- also has another incontinent stool.   ROS: Patient denies fever, rash, sore throat, blurred vision, nausea, vomiting, diarrhea, cough, shortness of breath or chest pain, joint or back pain, headache, or mood change.   Objective:   No results found. Recent Labs    11/04/19 0246  WBC 5.2  HGB 9.1*  HCT 28.8*  PLT 438*   Recent Labs    11/04/19 0246  NA 138  K 3.9  CL 103  CO2 25  GLUCOSE 102*  BUN 10  CREATININE 0.54*  CALCIUM 8.4*    Intake/Output Summary (Last 24 hours) at 11/04/2019 0902 Last data filed at 11/04/2019 5638 Gross per 24 hour  Intake 120 ml  Output 2700 ml  Net -2580 ml     Physical Exam: Vital Signs Blood pressure 111/66, pulse 64, temperature 98.5 F (36.9 C), temperature source Oral, resp. rate 18, height '6\' 2"'  (1.88 m), SpO2 100 %.  Physical Exam Constitutional: No distress . Vital signs reviewed. Sitting up doing pressure relief in manual w/c; NAD HEENT: EOMI, oral membranes moist Neck: supple Cardiovascular: RRR without murmur. No JVD    Respiratory: CTA Bilaterally without wheezes or rales. Normal effort    GI: BS +, non-tender, non-distended  Musculoskeletal:  General: No deformity.  Comments: tr pedal edema bilaterally Neurological: He is alertand oriented to person, place, and time. Nocranial nerve deficit. Intact sensation in UE. Impaired sensation otherwise below chest. Biceps 5/5, triceps 5/5, WE 5/5; grip 4/5; finger abd 4/5 B/L. LLE tr to  0/5  RLE: 1-2/5. DTR's absent in LE still.   Skin: Skin iswarmand  dry.No rashnoted. He is not diaphoretic.; foley out Noerythema. Pressure injury to buttocks and scrotum--blistered area on right buttock and on underside of scrotum.--not visualized today  Now has pressure injury/DTI seen in middle- Stage II- near coccyx on L and R buttocks and surrounded by large area of shear injury on both sides with scuffed skin.  Psych: pleasant affect   Assessment/Plan: 1. Functional deficits secondary to C7 quadriplegia/ ASIA B with neurogenic bowel and bladder which require 3+ hours per day of interdisciplinary therapy in a comprehensive inpatient rehab setting.  Physiatrist is providing close team supervision and 24 hour management of active medical problems listed below.  Physiatrist and rehab team continue to assess barriers to discharge/monitor patient progress toward functional and medical goals  Care Tool:  Bathing    Body parts bathed by patient: Right arm, Left arm, Chest, Face, Abdomen, Right upper leg, Right lower leg, Left lower leg, Left upper leg   Body parts bathed by helper: Buttocks, Front perineal area Body parts n/a: Front perineal area, Buttocks   Bathing assist Assist Level: Minimal Assistance - Patient > 75%     Upper Body Dressing/Undressing Upper body dressing   What is the patient wearing?: Pull over shirt    Upper body assist Assist Level: Independent    Lower Body Dressing/Undressing Lower body dressing      What is the patient wearing?: Pants, Incontinence brief     Lower body assist Assist for lower body dressing: Minimal Assistance - Patient >  75%(Assist for incontinent brief only)     Naval architect Activity did not occur Landscape architect and hygiene only): N/A (no void or bm)  Toileting assist Assist for toileting: 2 Helpers     Transfers Chair/bed transfer  Transfers assist  Chair/bed transfer activity did not occur: Safety/medical concerns  Chair/bed transfer assist level: 2 Helpers      Locomotion Ambulation   Ambulation assist   Ambulation activity did not occur: Safety/medical concerns          Walk 10 feet activity   Assist  Walk 10 feet activity did not occur: Safety/medical concerns        Walk 50 feet activity   Assist Walk 50 feet with 2 turns activity did not occur: Safety/medical concerns         Walk 150 feet activity   Assist Walk 150 feet activity did not occur: Safety/medical concerns         Walk 10 feet on uneven surface  activity   Assist Walk 10 feet on uneven surfaces activity did not occur: Safety/medical concerns         Wheelchair     Assist Will patient use wheelchair at discharge?: Yes Type of Wheelchair: Manual Wheelchair activity did not occur: Safety/medical concerns  Wheelchair assist level: Supervision/Verbal cueing Max wheelchair distance: 150'    Wheelchair 50 feet with 2 turns activity    Assist    Wheelchair 50 feet with 2 turns activity did not occur: Safety/medical concerns   Assist Level: Supervision/Verbal cueing   Wheelchair 150 feet activity     Assist  Wheelchair 150 feet activity did not occur: Safety/medical concerns   Assist Level: Supervision/Verbal cueing   Blood pressure 111/66, pulse 64, temperature 98.5 F (36.9 C), temperature source Oral, resp. rate 18, height '6\' 2"'  (1.88 m), SpO2 100 %.  Medical Problem List and Plan: 1.C7 Quadriplegia ASIA B and functional deficitssecondary to C7-T1 epidural abscess with associated paraplegia -patient maynot yetshower -ELOS/Goals: min assist PT/OT 28 days  12/2- will maintain on low air loss bed until Monday then reassess Stage II pressure ulcer he came with, patient wishes to switch to another bed he does not find current bed comfortable  12/7- change to regular bed; turn every 2 hours from side to side, since has wound on coccyx/L buttock.  12/8- turing q2 hours   12/14- PT wants abd  binder- ordered  12/3- PT doing ACE wraps/TEDs for orthostatic hypotension related to SCI- not using Abd binder 2. Antithrombotics: -DVT/anticoagulation:Eliquis 11/28- will try and get NSU to allow Lovenox due to  high risk of DVT in SCI patients.  11/29- has B/L gastroc DVTs- started Apixiban 10 mg BID x 1 week then 5 mg BID for DVTs.  -antiplatelet therapy: N/A 3. Pain Management:does not like oxycodone due to SE--will change to ultram prn.Encouragedpatient to take muscle relaxers and Tylenol as needed  12/11- muscle rub added 4. Mood:LCSW to follow for evaluation and support -antipsychotic agents: N/A 5. Neuropsych: This patientiscapable of making decisions onhisown behalf. 6. Skin/Wound Care:Routine pressure relief measureswith local care to sacrum. [place] RN consult as appropriate.  12/2- will con't low air loss bed unti Monday and then reassess- came with this Stage II to rehab  12/7- put back on regular bed and turn q2 hours.   12/8- turning well- and wearing PRAFOs to help feet/stop skin breakdown and prevent foot drop somewhat 7. Fluids/Electrolytes/Nutrition:Monitor I's/O. good po. 8.Acute blood loss anemia: Recheck CBC in a.m.  11/28- Hb 7.8- might cause more problems with orthostatic hypotension- will need abd binder, TEDs, and possibly Midodrine. Will monitor.   11/29- no SCDs due to DVTs 9.PAF: Monitor heart rate 3 times daily. Continue metoprolol 25 mg twice daily 10. Neurogenic bladder:  ICP volumes 600-745m- Nsg will need to increase freq to Q4h per orders  12/8- even at q4 hours, pt's volumes at night are 550-925cc- will d/w pt.  12/13 pt doing caths, volumes a little high. Adjust frequency accordingly  12/14- doing q4 hours caths- volumes still high- drinking too much? IV ABX don't help. 11.Neurogenic bowel: Reports p.o. intake has been poor due to change in taste---has been drinking a lot of smoothies. Discussed dietary needs  and changes. Change senna to 2 every morning with suppositories in evening after supper for PM program.   12/2- got bowel program- no results- will monitor closely; also, will get foley out today and start in/out cath program   12/3- will increase bowel meds today- if doesn't have BM tonight, will do mg citrate/enema tomorrow- added miralax and increased senokot to 3 tabs in AM  12/4- ordered mg citrate and in 3 hours do tap water enema- should hopefully work.  12/6 incont mushy BM-large  12/7- need to do all dig stim so doesn't have accidents later on, like today.  1/28- bowel habit hasn't kicked in yet- still having incontinence  11/9- had great BM with bowel program last night- also d/w pt need to keep intake to 2L/day- and showed him what that meant- also d/w nursing that needs to start cathing himself- they will start today.   12/11- rectal clear added in AM  -12/13 results last night, stool too soft though, hold senna and continue miralax   -abx component  12/14- still having bowel accidents in spite of rectal clear.  12. Leucocytosis in setting of Epidural abscess- on IV ABX x 8 weeks: Follow up CBC in am.  11/28- resolved leukocytosis- will monitor labs at least weekly-  121- ESR 82; CRP 11.6- will monitor weekly for now.  13. Hyperglycemia: Stress Induced hyperglycemia v/s IPG--will check Hgb A1C in am.  11/29- A1c 5.6 #14.  Normochromic normocytic anemia we will check stool guaiac given anticoagulants, start iron supplement  12/7- back up to 8.4- doing better/is recovering some   12/14- Hb up to 9.1      LOS: 17 days A FACE TO FACE EVALUATION WAS PERFORMED  Daiveon Markman 11/04/2019, 9:02 AM

## 2019-11-04 NOTE — Progress Notes (Signed)
Pt had med size incontinent BM. Pt self cath for 700 ml. Denies pain, and was able to  Sleep well through out the night.

## 2019-11-04 NOTE — Progress Notes (Signed)
Physical Therapy Session Note  Patient Details  Name: Ryan Reed MRN: [id number] Date of Birth: 01/05/1951  Today's Date: 11/04/2019 PT Individual Time: 1340-1455 PT Individual Time Calculation (min): 75 min   Short Term Goals: Week 2:  PT Short Term Goal 1 (Week 2): Pt will recall 3 pressure relief techniques and pressure relief schedule PT Short Term Goal 1 - Progress (Week 2): Met PT Short Term Goal 2 (Week 2): Pt will perform bed mobility with assist x 1 PT Short Term Goal 2 - Progress (Week 2): Met PT Short Term Goal 3 (Week 2): Pt will maintain sitting balance with min A x 5 min PT Short Term Goal 3 - Progress (Week 2): Met Week 3:  PT Short Term Goal 1 (Week 3): Pt will perform least restrictive transfer with assist x 1 PT Short Term Goal 2 (Week 3): Pt will maintain sitting balance with SBA x 5 min PT Short Term Goal 3 (Week 3): Pt will demonstrate independence with LE PROM stretching program  Skilled Therapeutic Interventions/Progress Updates:    Bed mobility retraining for supine to sit with mod assist for BLE management and pt using bedrails for support. Donned shoes with total assist by PT with pt maintaining sitting balance with close supervision to CGA. Performed slideboard transfer with mod assist (+2 for safety) to w/c with focus on technique, clearing bottom, head/hips relationship, and w/c parts management. Pt requires assist with legrest management but able to do the armrests independently and verbalize sequence for transfer. Used maxi sky for transfer to/from tilt table with trunk control and balance retraining during donning and doffing of sling in seated position and pt repositioning in the w/c using armrests for support (when transferred back). Pt able to propel w/c to and from therapy session at supervision level due to connected to IV at this time. Utilized tilt table for NMR to address weightbearing through BLE, postural control and upright reorientation, and to  aid with overall cardiovascular/respiratory function, bowel and bladder function, and musculoskeletal/neurological system. Vitals monitored throughout. Pt does appear to have lower resting BP this PM than his usual per report but was not symptomatic except as indicated below. Total of 26 min on tilt table ranging from 20-50 degrees. End of session returned back to bed via slideboard as described above with mod assist overall for transfer and to return to supine. Total assist to doff pants per patient request and removed ACE wraps. Pt awaiting cath, so declined "boots" at this time.   88/59 mmHg; HR = 64 bpm -Seated without binder, asymptomatic. 104/59 mmHg; HR = 63 bpm - supine with abdominal binder, asymptomatic 87/53 mmHg; HR = 68 bpm - 20 degrees, abdominal binder, asymptomatic; 2 min total  91/54 mmHg; 20 degrees, abdominal binder, asymptomatic; 4 min total  77/62 mmHg; HR= 75 bpm; 40 degrees, abdominal binder, symptomatic; 6 min total  76/49 mmHg; 40 degrees, abdominal binder, symptomatic; 10 min total 92/53 mmHg; HR = 67 bpm; 35 degrees, abdominal binder, asymptomatic; 12 min total 92/59 mmHg; 40 degrees, abdominal binder, symptomatic; 16 min total 90/55 mmHg; HR= 68 bpm; 45 degrees, abdominal binder, asymptomatic; 19 min total    84/57 mmHg; 50 degrees, abdominal binder, asymptomatic; 24 min total   Therapy Documentation Precautions:  Precautions Precautions: Cervical, Back Restrictions Weight Bearing Restrictions: No  Pain:  No reports of pain just discomfort of "tightness" in his low back throughout the day. Repositioned as able.     Therapy/Group: Individual Therapy  Canary Brim  Ivory Broad, PT, DPT, CBIS  11/04/2019, 3:11 PM

## 2019-11-04 NOTE — Plan of Care (Signed)
  Problem: Consults Goal: RH SPINAL CORD INJURY PATIENT EDUCATION Description:  See Patient Education module for education specifics.  Outcome: Progressing   Problem: Consults Goal: RH SPINAL CORD INJURY PATIENT EDUCATION Description:  See Patient Education module for education specifics.  Outcome: Progressing   Problem: SCI BOWEL ELIMINATION Goal: RH STG SCI MANAGE BOWEL PROGRAM W/ASSIST OR AS APPROPRIATE Description: STG SCI Manage bowel program w/assist or as appropriate. Mod Outcome: Progressing   Problem: SCI BLADDER ELIMINATION Goal: RH STG SCI MANAGE BLADDER PROGRAM W/ASSISTANCE Description: Patient will manage bladder with mod assist Outcome: Progressing   Problem: RH SKIN INTEGRITY Goal: RH STG MAINTAIN SKIN INTEGRITY WITH ASSISTANCE Description: STG Maintain Skin Integrity With Assistance. Mod Outcome: Progressing Goal: RH STG ABLE TO PERFORM INCISION/WOUND CARE W/ASSISTANCE Description: STG Able To Perform Incision/Wound Care With Assistance. Max Outcome: Progressing

## 2019-11-05 ENCOUNTER — Inpatient Hospital Stay (HOSPITAL_COMMUNITY): Payer: Medicare Other | Admitting: Occupational Therapy

## 2019-11-05 ENCOUNTER — Inpatient Hospital Stay (HOSPITAL_COMMUNITY): Payer: Medicare Other | Admitting: Physical Therapy

## 2019-11-05 MED ORDER — CHLORHEXIDINE GLUCONATE CLOTH 2 % EX PADS
6.0000 | MEDICATED_PAD | Freq: Two times a day (BID) | CUTANEOUS | Status: DC
  Administered 2019-11-05 – 2019-11-13 (×12): 6 via TOPICAL

## 2019-11-05 NOTE — Progress Notes (Signed)
Physical Therapy Session Note  Patient Details  Name: Ryan Reed MRN: [id number] Date of Birth: 1951-01-31  Today's Date: 11/05/2019 PT Individual Time: 1100-1200 PT Individual Time Calculation (min): 60 min   Short Term Goals: Week 3:  PT Short Term Goal 1 (Week 3): Pt will perform least restrictive transfer with assist x 1 PT Short Term Goal 2 (Week 3): Pt will maintain sitting balance with SBA x 5 min PT Short Term Goal 3 (Week 3): Pt will demonstrate independence with LE PROM stretching program  Skilled Therapeutic Interventions/Progress Updates: Pt presented in w/c agreeable to therapy. Session focused on sitting balance in long sit with attempted circle sitting. Pt propleed to rehab gym supervision and PTA set up leg loops to have pt work on managing BLE for SB transfer. Pt ultimately required maxA for BLE management but with PTA set up pt performed SB transfer with minA. Pt then transferred to supine maxA with PTA using large wedge for support. Pt transferred to long sit maxA and worked on sitting balance in long sit x 5 min with modA. Pt with c/o increased pain at L groin stating same ms pain that he has been having. PTA placed wedge against LLE to maintain LLE in neutral with pt stating some relief and able to manage increased time in long sit.  PTA then performed gentle ROM to place pt's BLE in circle sitting to pt's tolerance. Pt was unable to maintain sitting without PTA behind to provide support or with pt placing BUE posteriorly. However pt was able to tolerate for approx 3-4 min overall. Pt then returned to resting position with PTA performing ROM to BLE including hip flexion, hip ER/IR, and ankle DF. Once completed pt transferred to sitting with maxA and was able to perform SB transfer to return to w/c in same manner as prior. Pt then propelled to room and performed SB transfer to bed minA. Pt required maxA for sit to supine transfer with use of bed features. Pt positioned to  comfort and left with call bell within reach, bed alarm on, and needs met.      Therapy Documentation Precautions:  Precautions Precautions: Cervical, Back Restrictions Weight Bearing Restrictions: No General:   Vital Signs: Therapy Vitals Temp: 98.4 F (36.9 C) Pulse Rate: 67 Resp: 18 BP: (!) 97/57 Patient Position (if appropriate): Lying Oxygen Therapy SpO2: 100 % O2 Device: Room Air  Therapy/Group: Individual Therapy  Mekhai Venuto  Jaton Eilers, PTA  11/05/2019, 4:15 PM

## 2019-11-05 NOTE — Patient Care Conference (Signed)
Inpatient RehabilitationTeam Conference and Plan of Care Update Date: 11/05/2019   Time: 11:00 AM    Patient Name: Ryan Reed      Medical Record Number: [id number]  Date of Birth: 01/13/1951 Sex: Male         Room/Bed: 4W14C/4W14C-01 Payor Info: Payor: [place] / Plan: [facility] MEDICARE / Product Type: *No Product type* /    Admit Date/Time:  10/18/2019  5:03 PM  Primary Diagnosis:  Quadriplegia [facility])  Patient Active Problem List   Diagnosis Date Noted  . Quadriplegia ([place]) 10/19/2019  . 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 Epidural abscess 10/14/2019    Expected Discharge Date: Expected Discharge Date: 11/13/19  Team Members Present: Physician leading conference: Dr. Courtney Heys Social Worker Present: Lennart Pall, LCSW Nurse Present: Dwaine Gale, RN Case Manager: Karene Fry, RN PT Present: Excell Seltzer, PT OT Present: Amy Rounds, OT SLP Present: Jettie Booze, CF-SLP PPS Coordinator present : Ileana Ladd, Burna Mortimer, SLP     Current Status/Progress Goal Weekly Team Focus  Bowel/Bladder   pt incont of BM, bowel program w/ 2x dig stem. Pt still having multiple BM through out the night. Pt in/out cath by self. Pt is scannned Q4h due to lg volume.  Bowel program after dinner each evening will contiue, Pt will continue to self cath  Pt will be sat in the upright position on BSC for BM, Continue to assess pts clean technique during self cath.   Swallow/Nutrition/ Hydration             ADL's   mod A beasy board transfer, min A LB self care from bed level UB with mod I at sink. Pt is perform self cathing.  Min A overall  ADL retraining, functional balance, SCI education, bowel/bladder education/positioning for self cathing   Mobility   min A rolling, mod A supine to/from sit, SB transfers min to mod A with occasional +2 SBA for safety, S to mod I w/c mobility  min A transfers, mod A bed mobility, S at w/c  level  sitting balance, transfers, bed mobility, SCI edu   Communication             Safety/Cognition/ Behavioral Observations            Pain   Pt c/o 6/10 back pain before bed. PRN tramadol given, effective.  Pain will be less than 4  Assess pain Qshift/PRN, treat PRN   Skin   Pressure ulcer to sacrum,sm sheering site to R/L buttocks- Foam in place, Chanigng PRN.Pt refusing the Q2h turning schedule, Q4h turning with bladder scan  No further skin breakdown and freee of infection  Turn pt when pt allows. Assess skin Qshift/PRN    Rehab Goals Patient on target to meet rehab goals: Yes *See Care Plan and progress notes for long and short-term goals.     Barriers to Discharge  Current Status/Progress Possible Resolutions Date Resolved   Nursing                  PT  Neurogenic Bowel & Bladder                 OT                  SLP                SW  Discharge Planning/Teaching Needs:  Pt to d/c home with wife who can provide 24/7 assistance.  Local son and daughter-in-law also to assist.  Teaching needs TBD   Team Discussion: Issues with bowel program, doing I+O caths, volumes high.  RN cathed self this am, high volumes, needs q4h caths, drsg to bottom.  PT min A rolling, mod A supine to sit, slideboard min/mod A, I W/C mobility, wife to come in this week.  OT transfers min/mod slideboard, UB B/D set up, LB mod A, interested in doing his won bowel program.   Revisions to Treatment Plan: N/A     Medical Summary Current Status: cath volumes still high- 600-1200cc after 6 hrs once; STage II coccyx ulcer- needs to monitor; bed min-mod assist; SB transfers- no shearing- min-mod assist Weekly Focus/Goal: IV ABX - teaching-rectal clear in AM; w/c mobility mod I even pressure relief; clears bottom for sliding board; UB set-up; LB mod assist  Barriers to Discharge: Decreased family/caregiver support;Home enviroment access/layout;Neurogenic Bowel & Bladder;Incontinence;IV  antibiotics;Wound care;Weight bearing restrictions  Barriers to Discharge Comments: bowel program Possible Resolutions to Barriers: wants to do bowel program himself- will try to teach   Continued Need for Acute Rehabilitation Level of Care: The patient requires daily medical management by a physician with specialized training in physical medicine and rehabilitation for the following reasons: Direction of a multidisciplinary physical rehabilitation program to maximize functional independence : Yes Medical management of patient stability for increased activity during participation in an intensive rehabilitation regime.: Yes Analysis of laboratory values and/or radiology reports with any subsequent need for medication adjustment and/or medical intervention. : Yes   I attest that I was present, lead the team conference, and concur with the assessment and plan of the team.   Retta Diones 11/05/2019, 9:26 PM  Team conference was held via web/ teleconference due to [place] - 19

## 2019-11-05 NOTE — Progress Notes (Signed)
Patient self cathed at 0900 for 600cc and 1439 for 500cc. Patient able to successfully self cath with set up assist and continued education regarding clean technique and hand placement. Honeycomb dressing to back rolling up so it was removed. Steri strips intact. No active drainage. Bowel program started about 1815. Patient positioned to Rt side and able to preform his own digstim for 15 seconds. With assist for hand placement, patient then able to insert suppository. After 15 minutes patient able to preform dig stim again with assist for hand placement. No results noted. Patient somewhat limited by right hip pain. This nurse then did additional dig stim. No results at that time but could feel stool in rectal vault. After 10 minutes, this nurse able to go back and perform additional dig stim for 30 seconds. Small amount of stool removed. Patient cleansed and repositioned on back with pillow support. Report given to on coming shift.

## 2019-11-05 NOTE — Progress Notes (Signed)
[place] PHYSICAL MEDICINE & REHABILITATION PROGRESS NOTE   Subjective/Complaints: . Pt reports had good results with bowel program las tnight, but also had a LARGE BM this AM BEFORE rectal clear.   Also has a few spasms, but doesn't feel uncomfortable. Also notes getting movement in R hip/upper R leg.    ROS: Patient denies fever, rash, sore throat, blurred vision, nausea, vomiting, diarrhea, cough, shortness of breath or chest pain, joint or back pain, headache, or mood change.   Objective:   No results found. Recent Labs    11/04/19 0246  WBC 5.2  HGB 9.1*  HCT 28.8*  PLT 438*   Recent Labs    11/04/19 0246  NA 138  K 3.9  CL 103  CO2 25  GLUCOSE 102*  BUN 10  CREATININE 0.54*  CALCIUM 8.4*    Intake/Output Summary (Last 24 hours) at 11/05/2019 0941 Last data filed at 11/05/2019 0902 Gross per 24 hour  Intake 1180 ml  Output 4150 ml  Net -2970 ml     Physical Exam: Vital Signs Blood pressure 106/66, pulse 69, temperature 98.2 F (36.8 C), temperature source Oral, resp. rate 16, height '6\' 2"'  (1.88 m), SpO2 100 %.  Physical Exam Constitutional: lying in bed; on phone;NAD HEENT: EOMI, oral membranes moist Neck: supple Cardiovascular: RRR without murmur. No JVD    Respiratory: CTA Bilaterally without wheezes or rales. Normal effort    GI: BS +, non-tender, non-distended  Musculoskeletal:  General: No deformity.  Comments: tr pedal edema bilaterally Neurological: seeing spasms of B/L LEs R>L occasionally He is alertand oriented to person, place, and time. Nocranial nerve deficit. Intact sensation in UE. Impaired sensation otherwise below chest. Biceps 5/5, triceps 5/5, WE 5/5; grip 4/5; finger abd 4/5 B/L. LLE tr to  0/5  RLE: HF-1-2/5. DTR's absent in LE still.   Skin: Skin iswarmand dry.No rashnoted. He is not diaphoretic.; foley out Noerythema. Pressure injury to buttocks and scrotum--blistered area on right buttock and on  underside of scrotum.--not visualized today  Now has pressure injury/DTI seen in middle- Stage II- near coccyx on L and R buttocks and surrounded by large area of shear injury on both sides with scuffed skin.  Psych: pleasant affect   Assessment/Plan: 1. Functional deficits secondary to C7 quadriplegia/ ASIA B with neurogenic bowel and bladder which require 3+ hours per day of interdisciplinary therapy in a comprehensive inpatient rehab setting.  Physiatrist is providing close team supervision and 24 hour management of active medical problems listed below.  Physiatrist and rehab team continue to assess barriers to discharge/monitor patient progress toward functional and medical goals  Care Tool:  Bathing    Body parts bathed by patient: Right arm, Left arm, Chest, Face, Abdomen, Right upper leg, Right lower leg, Left lower leg, Left upper leg   Body parts bathed by helper: Buttocks, Front perineal area Body parts n/a: Front perineal area, Buttocks   Bathing assist Assist Level: Minimal Assistance - Patient > 75%     Upper Body Dressing/Undressing Upper body dressing   What is the patient wearing?: Pull over shirt    Upper body assist Assist Level: Independent    Lower Body Dressing/Undressing Lower body dressing      What is the patient wearing?: Pants     Lower body assist Assist for lower body dressing: Set up assist     Toileting Toileting Toileting Activity did not occur (Clothing management and hygiene only): N/A (no void or bm)  Toileting assist  Assist for toileting: 2 Helpers     Transfers Chair/bed transfer  Transfers assist  Chair/bed transfer activity did not occur: Safety/medical concerns  Chair/bed transfer assist level: Minimal Assistance - Patient > 75%     Locomotion Ambulation   Ambulation assist   Ambulation activity did not occur: Safety/medical concerns          Walk 10 feet activity   Assist  Walk 10 feet activity did not  occur: Safety/medical concerns        Walk 50 feet activity   Assist Walk 50 feet with 2 turns activity did not occur: Safety/medical concerns         Walk 150 feet activity   Assist Walk 150 feet activity did not occur: Safety/medical concerns         Walk 10 feet on uneven surface  activity   Assist Walk 10 feet on uneven surfaces activity did not occur: Safety/medical concerns         Wheelchair     Assist Will patient use wheelchair at discharge?: Yes Type of Wheelchair: Manual Wheelchair activity did not occur: Safety/medical concerns  Wheelchair assist level: Supervision/Verbal cueing Max wheelchair distance: 150'    Wheelchair 50 feet with 2 turns activity    Assist    Wheelchair 50 feet with 2 turns activity did not occur: Safety/medical concerns   Assist Level: Supervision/Verbal cueing   Wheelchair 150 feet activity     Assist  Wheelchair 150 feet activity did not occur: Safety/medical concerns   Assist Level: Supervision/Verbal cueing   Blood pressure 106/66, pulse 69, temperature 98.2 F (36.8 C), temperature source Oral, resp. rate 16, height '6\' 2"'  (1.88 m), SpO2 100 %.  Medical Problem List and Plan: 1.C7 Quadriplegia ASIA B and functional deficitssecondary to C7-T1 epidural abscess with associated paraplegia -patient maynot yetshower -ELOS/Goals: min assist PT/OT 28 days  12/2- will maintain on low air loss bed until Monday then reassess Stage II pressure ulcer he came with, patient wishes to switch to another bed he does not find current bed comfortable  12/7- change to regular bed; turn every 2 hours from side to side, since has wound on coccyx/L buttock.  12/8- turing q2 hours   12/14- PT wants abd binder- ordered  12/3- PT doing ACE wraps/TEDs for orthostatic hypotension related to SCI- not using Abd binder 2. Antithrombotics: -DVT/anticoagulation:Eliquis 11/28- will try and get NSU to  allow Lovenox due to  high risk of DVT in SCI patients.  11/29- has B/L gastroc DVTs- started Apixiban 10 mg BID x 1 week then 5 mg BID for DVTs.  -antiplatelet therapy: N/A 3. Pain Management:does not like oxycodone due to SE--will change to ultram prn.Encouragedpatient to take muscle relaxers and Tylenol as needed  12/11- muscle rub added 4. Mood:LCSW to follow for evaluation and support -antipsychotic agents: N/A 5. Neuropsych: This patientiscapable of making decisions onhisown behalf. 6. Skin/Wound Care:Routine pressure relief measureswith local care to sacrum. [place] RN consult as appropriate.  12/2- will con't low air loss bed unti Monday and then reassess- came with this Stage II to rehab  12/7- put back on regular bed and turn q2 hours.   12/8- turning well- and wearing PRAFOs to help feet/stop skin breakdown and prevent foot drop somewhat 7. Fluids/Electrolytes/Nutrition:Monitor I's/O. good po. 8.Acute blood loss anemia: Recheck CBC in a.m.  11/28- Hb 7.8- might cause more problems with orthostatic hypotension- will need abd binder, TEDs, and possibly Midodrine. Will monitor.   11/29- no SCDs  due to DVTs 9.PAF: Monitor heart rate 3 times daily. Continue metoprolol 25 mg twice daily 10. Neurogenic bladder:  ICP volumes 600-756m- Nsg will need to increase freq to Q4h per orders  12/8- even at q4 hours, pt's volumes at night are 550-925cc- will d/w pt.  12/13 pt doing caths, volumes a little high. Adjust frequency accordingly  12/14- doing q4 hours caths- volumes still high- drinking too much? IV ABX don't help. 11.Neurogenic bowel: Reports p.o. intake has been poor due to change in taste---has been drinking a lot of smoothies. Discussed dietary needs and changes. Change senna to 2 every morning with suppositories in evening after supper for PM program.   12/2- got bowel program- no results- will monitor closely; also, will get foley out  today and start in/out cath program   12/3- will increase bowel meds today- if doesn't have BM tonight, will do mg citrate/enema tomorrow- added miralax and increased senokot to 3 tabs in AM  12/4- ordered mg citrate and in 3 hours do tap water enema- should hopefully work.  12/6 incont mushy BM-large  12/7- need to do all dig stim so doesn't have accidents later on, like today.  1/28- bowel habit hasn't kicked in yet- still having incontinence  11/9- had great BM with bowel program last night- also d/w pt need to keep intake to 2L/day- and showed him what that meant- also d/w nursing that needs to start cathing himself- they will start today.   12/11- rectal clear added in AM  -12/13 results last night, stool too soft though, hold senna and continue miralax   -abx component  12/15 still having bowel accidents in spite of rectal clear/even before.  12. Leucocytosis in setting of Epidural abscess- on IV ABX x 8 weeks: Follow up CBC in am.  11/28- resolved leukocytosis- will monitor labs at least weekly-  121- ESR 82; CRP 11.6- will monitor weekly for now.  13. Hyperglycemia: Stress Induced hyperglycemia v/s IPG--will check Hgb A1C in am.  11/29- A1c 5.6 #14.  Normochromic normocytic anemia we will check stool guaiac given anticoagulants, start iron supplement  12/7- back up to 8.4- doing better/is recovering some   12/14- Hb up to 9.1 15. Spasticity  12/15- not enough to need meds as of yet.      LOS: 18 days A FACE TO FACE EVALUATION WAS PERFORMED  Ryan Reed 11/05/2019, 9:41 AM

## 2019-11-05 NOTE — Plan of Care (Signed)
  Problem: Consults Goal: RH SPINAL CORD INJURY PATIENT EDUCATION Description:  See Patient Education module for education specifics.  Outcome: Progressing   Problem: Consults Goal: RH SPINAL CORD INJURY PATIENT EDUCATION Description:  See Patient Education module for education specifics.  Outcome: Progressing   Problem: SCI BOWEL ELIMINATION Goal: RH STG SCI MANAGE BOWEL PROGRAM W/ASSIST OR AS APPROPRIATE Description: STG SCI Manage bowel program w/assist or as appropriate. Mod Outcome: Progressing   Problem: SCI BLADDER ELIMINATION Goal: RH STG SCI MANAGE BLADDER PROGRAM W/ASSISTANCE Description: Patient will manage bladder with mod assist Outcome: Progressing   Problem: RH SKIN INTEGRITY Goal: RH STG MAINTAIN SKIN INTEGRITY WITH ASSISTANCE Description: STG Maintain Skin Integrity With Assistance. Mod Outcome: Progressing Goal: RH STG ABLE TO PERFORM INCISION/WOUND CARE W/ASSISTANCE Description: STG Able To Perform Incision/Wound Care With Assistance. Max Outcome: Progressing

## 2019-11-05 NOTE — Consult Note (Signed)
Neuropsychological Consultation   Patient:   Ryan Reed   DOB:   [birth year]  MR Number:  [id number]  Location:  [place] [facility] [place] [id number] [place] [address] Dept: [id number]: [phone]           Date of Service:   11/04/2019  Start Time:   10 PM End Time:   11 PM  Provider/Observer:  Ilean Skill, Psy.D.       Clinical Neuropsychologist       Billing Code/Service: [id number], [phone]  Chief Complaint:    Ryan Reed. Mclinn is a 68 year old male who has been in relatively good health with the exception of possible undiagnosed obstructive sleep apnea.  The patient developed severe back and chest pain.  Initially seen in urgent care 10/13/2019 with recommendations to go to the ED but did not 3. he developed acute onset of paraplegia with sensory loss and was admitted on 10/14/2019 for work-up.  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  Patient taken the OR emergently for decompressive laminectomy by Dr. Ronnald Ramp.  Patient is recommended for 8 weeks of IV antibiotic therapy.  Patient with paraplegia and sensory deficits from the chest down.  Patient reports now that he has some sensation in his legs but nearly no motor movement from his chest down bilaterally.  11/04/2019: Today's visit was a follow-up visit with this patient who I initially saw a couple weeks prior.  The patient reports that his mood is still staying fairly positive although he is quite frustrated with his overall loss of function.  The patient reports that he is somewhat concerned about once he is discharged of being a "burden to his family."  However, the patient reports that he is beginning to get some movement in his right leg which he is hoping will help with transfers so he can be  more independent.  Reason for Service:  Patient was referred for neuropsychological consultation due to coping and adjustment issues due to loss of function/paraplegia.  Below is the HPI for the current admission.  [id number] Ryan Reed is a 68 year old male in relatively good health except for possibly undiagnosed OSA, normocytic anemia who was developed severe back and chest pain--seen at urgent care on 10/13/19 with recommendations to go to ED but declined. He developed acute onset of paraplegia with sensory loss and was admitted on 10/14/19 for work up. 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 He was taken to the OR emergently for C7-T7 posterior decompressive laminectomy with medial facetectomy and foraminotomy for evacuation of epidural abscess by Dr. Ronnald Ramp. Postop had acute blood loss anemia requiring 4 units of packed red blood cells as well as A. fib requiring beta-blocker for rate control. Wound culture shows abundant Gemella morbilliform and he was started on Vanco/cefepime initially--this was narrowed to Rocephin 2 g twice daily on11/25.2D echo done showed EF of 60 to 65% with increased LVH--no evidence of endocarditis. Dr. Graylon Good recommended 8 weeks of IV antibiotic therapy and will follow up for further recommendations. Patient with paraplegia and sensory deficits from chest down. Foley in place due to neurogenic bladder and he has not had a bowel movement  except for small accident this a.m. Therapy has been ongoing and working on bed mobility. CIR recommended due to functional decline  Current Status:  While the patient was tearful at times but overall he was in good spirits reported no significant symptoms of depression or anxiety.  The patient reports good understanding of purposeful rehabilitative efforts and reports good motivation to  continue functioning.  Cognition and mental status were quite good.  Behavioral Observation: FREDDI KOIKE  presents as a 68 y.o.-year-old Right Caucasian Male who appeared his stated age. his dress was Appropriate and he was Well Groomed and his manners were Appropriate to the situation.  his participation was indicative of Appropriate and Attentive behaviors.  There were any physical disabilities noted.  he displayed an appropriate level of cooperation and motivation.     Interactions:    Active Appropriate and Attentive  Attention:   within normal limits and attention span and concentration were age appropriate  Memory:   within normal limits; recent and remote memory intact  Visuo-spatial:  not examined  Speech (Volume):  normal  Speech:   normal; normal  Thought Process:  Coherent and Relevant  Though Content:  WNL; not suicidal and not homicidal  Orientation:   person, place, time/date and situation  Judgment:   Good  Planning:   Good  Affect:    Appropriate  Mood:    Dysphoric  Insight:   Good  Intelligence:   normal  Medical History:  History reviewed. No pertinent past medical history.  Psychiatric History:  No prior psychiatric history.  Family Med/Psych History:  Family History  Problem Relation Age of Onset  . Heart disease Father   . Cardiomyopathy Sister    Impression/DX:  Ryan Reed. Ryan Reed is a 68 year old male who has been in relatively good health with the exception of possible undiagnosed obstructive sleep apnea.  The patient developed severe back and chest pain.  Initially seen in urgent care 10/13/2019 with recommendations to go to the ED but did not 3. he developed acute onset of paraplegia with sensory loss and was admitted on 10/14/2019 for work-up.  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  Patient  taken the OR emergently for decompressive laminectomy by Dr. Ronnald Ramp.  Patient is recommended for 8 weeks of IV antibiotic therapy.  Patient with paraplegia and sensory deficits from the chest down.  Patient reports now that he has some sensation in his legs but nearly no motor movement from his chest down bilaterally.  While the patient was tearful at times but overall he was in good spirits reported no significant symptoms of depression or anxiety.  The patient reports good understanding of purposeful rehabilitative efforts and reports good motivation to continue functioning.  Cognition and mental status were quite good.  11/04/2019: Today's visit was a follow-up visit with this patient who I initially saw a couple weeks prior.  The patient reports that his mood is still staying fairly positive although he is quite frustrated with his overall loss of function.  The patient reports that he is somewhat concerned about once he is discharged of being a "burden to his family."  However, the patient reports that he is beginning to get some movement in his right leg which he is hoping will help with transfers so he can be more independent.  Disposition/Plan:  Worked on coping  issues around residual motor deficits.  The patient will be discharged in roughly 2 weeks and at this point he appears to be coping fairly well and I do not anticipate needing to see him again before discharge.  However, if his mood does change I am available to follow-up with the patient.  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

## 2019-11-05 NOTE — Progress Notes (Signed)
Physical Therapy Session Note  Patient Details  Name: Ryan Reed MRN: [id number] Date of Birth: 1951/09/01  Today's Date: 11/05/2019 PT Individual Time: 1300-1415 PT Individual Time Calculation (min): 75 min   Short Term Goals: Week 3:  PT Short Term Goal 1 (Week 3): Pt will perform least restrictive transfer with assist x 1 PT Short Term Goal 2 (Week 3): Pt will maintain sitting balance with SBA x 5 min PT Short Term Goal 3 (Week 3): Pt will demonstrate independence with LE PROM stretching program  Skilled Therapeutic Interventions/Progress Updates:    Pt received seated in bed in room, agreeable to PT session. No complaints of pain. Pt reports improved comfort and tolerance for sitting in w/c with addition of supportive back. Supine to sit with mod A for BLE management with use of hospital bed features. Slide board transfer bed to w/c with min A. Pt is at mod I level for manual w/c propulsion with use of BUE x 250 ft. Slide board transfer w/c to mat table with mod A. Sitting balance EOM with close SBA to CGA. Scooting L/R with min A for sitting balance while maneuvering BLE with use of leg loops. Seated ball toss 2 x 5 reps with CGA to min A for sitting balance, one near LOB requiring min A to correct. Slide board transfer back to w/c with min A. Slide board transfer into 27.5" height car to simulate patient's RAV4 with mod A for transfer going uphill, mod A to get BLE into and back out of car. Slide board transfer back to w/c with mod A for safety and balance. Slide board transfer back to bed with mod A. Sit to supine mod A for BLE management, pt attempts to manage BLE with use of leg lifters but unable to lift back up onto bed. Pt left semi-reclined in bed with needs in reach, bed alarm in place at end of session.  Therapy Documentation Precautions:  Precautions Precautions: Cervical, Back Restrictions Weight Bearing Restrictions: No    Therapy/Group: Individual  Therapy   Excell Seltzer, PT, DPT  11/05/2019, 3:58 PM

## 2019-11-05 NOTE — Progress Notes (Signed)
Occupational Therapy Session Note  Patient Details  Name: Ryan Reed MRN: [id number] Date of Birth: 1951-09-23  Today's Date: 11/05/2019 OT Individual Time: 0900-1000 OT Individual Time Calculation (min): 60 min    Short Term Goals: Week 3:  OT Short Term Goal 1 (Week 3): Pt will complete clothing management in prep for self-cathing independently OT Short Term Goal 2 (Week 3): Pt will consistently transfer w/c<>EOB with +1 assist in order to decrease caregiver burden OT Short Term Goal 3 (Week 3): Will initiate hands on training with pt's caregivers in prep for d/c OT Short Term Goal 4 (Week 3): Pt will don pants from bed level with set-up  Skilled Therapeutic Interventions/Progress Updates:    Patient in bed, alert, aware of needs and ready for therapy session.  Completed LB bathing and dressing at bed level with set up for bathing, min A for pants over feet.  Rolling in bed with CS.  Supine to SSP with mod A.  SB transfer bed to w/c with mod A of one, stand by of another, cues for body positioning (good clearance of buttocks).  Grooming, UB bathing and dressing completed w/c level mod I.   Reviewed steps of bowel program and discussed opportunity for him to start taking on this responsibility.   Reviewed DME options - he needs a long handled mirror for inspection (none currently available)  He remained in the w/c at close of session with call bell and tray table in reach.    Therapy Documentation Precautions:  Precautions Precautions: Cervical, Back Restrictions Weight Bearing Restrictions: No General:   Vital Signs: Therapy Vitals Pulse Rate: 69 BP: 106/66 Pain: Pain Assessment Pain Scale: 0-10 Pain Score: 4  Pain Type: Acute pain Pain Location: Leg Pain Descriptors / Indicators: Aching Pain Onset: With Activity Pain Intervention(s): Repositioned   Other Treatments:     Therapy/Group: Individual Therapy  Carlos Levering 11/05/2019, 12:14 PM

## 2019-11-06 ENCOUNTER — Ambulatory Visit (HOSPITAL_COMMUNITY): Payer: Medicare Other | Admitting: *Deleted

## 2019-11-06 ENCOUNTER — Inpatient Hospital Stay (HOSPITAL_COMMUNITY): Payer: Medicare Other | Admitting: Occupational Therapy

## 2019-11-06 MED ORDER — METOPROLOL TARTRATE 12.5 MG HALF TABLET
12.5000 mg | ORAL_TABLET | Freq: Two times a day (BID) | ORAL | Status: DC
  Administered 2019-11-06 – 2019-11-11 (×10): 12.5 mg via ORAL
  Filled 2019-11-06 (×14): qty 1

## 2019-11-06 NOTE — Progress Notes (Signed)
[place] PHYSICAL MEDICINE & REHABILITATION PROGRESS NOTE   Subjective/Complaints: . Pt reports did own dig stim/bowel program with help last night- per staff notes, had 2 more BMs ~ 10pm.  Also had a lightheaded episode in shower- BP was 83/50s. Michela Pitcher he was told his Bottom sacral wounds were better.   Will decrease BP meds since had low BP episode- at least might be able ot stop doing ACE wraps.   ROS: Patient denies fever, rash, sore throat, blurred vision, nausea, vomiting, diarrhea, cough, shortness of breath or chest pain, joint or back pain, headache, or mood change.   Objective:   No results found. Recent Labs    11/04/19 0246  WBC 5.2  HGB 9.1*  HCT 28.8*  PLT 438*   Recent Labs    11/04/19 0246  NA 138  K 3.9  CL 103  CO2 25  GLUCOSE 102*  BUN 10  CREATININE 0.54*  CALCIUM 8.4*    Intake/Output Summary (Last 24 hours) at 11/06/2019 0908 Last data filed at 11/06/2019 0315 Gross per 24 hour  Intake 840 ml  Output 1650 ml  Net -810 ml     Physical Exam: Vital Signs Blood pressure (!) 93/57, pulse 69, temperature 98.1 F (36.7 C), resp. rate 18, height '6\' 2"'  (1.88 m), SpO2 99 %.  Physical Exam Constitutional: lying in bed; finished shower; tired;NAD HEENT: EOMI, oral membranes moist Neck: supple Cardiovascular: RRR without murmur. No JVD    Respiratory: CTA Bilaterally without wheezes or rales. Normal effort    GI: BS +, non-tender, non-distended  Musculoskeletal:  General: No deformity.  Comments: tr pedal edema bilaterally Neurological: seeing spasms of B/L LEs R>L occasionally He is alertand oriented to person, place, and time. Nocranial nerve deficit. Intact sensation in UE. Impaired sensation otherwise below chest. Biceps 5/5, triceps 5/5, WE 5/5; grip 4/5; finger abd 4/5 B/L. LLE tr to  0/5  RLE: HF-1-2/5. DTR's absent in LE still.   Skin: Skin iswarmand dry.No rashnoted. He is not diaphoretic.; foley out  Noerythema. Pressure injury to buttocks and scrotum--blistered area on right buttock and on underside of scrotum.--not visualized today  Now has pressure injury/DTI seen in middle- Stage II- near coccyx on L and R buttocks and surrounded by large area of shear injury on both sides with scuffed skin.  Psych: pleasant affect   Assessment/Plan: 1. Functional deficits secondary to C7 quadriplegia/ ASIA B with neurogenic bowel and bladder which require 3+ hours per day of interdisciplinary therapy in a comprehensive inpatient rehab setting.  Physiatrist is providing close team supervision and 24 hour management of active medical problems listed below.  Physiatrist and rehab team continue to assess barriers to discharge/monitor patient progress toward functional and medical goals  Care Tool:  Bathing    Body parts bathed by patient: Right arm, Left arm, Chest, Face, Abdomen, Right upper leg, Right lower leg, Left lower leg, Left upper leg, Front perineal area   Body parts bathed by helper: Buttocks Body parts n/a: Front perineal area, Buttocks   Bathing assist Assist Level: Minimal Assistance - Patient > 75%     Upper Body Dressing/Undressing Upper body dressing   What is the patient wearing?: Pull over shirt    Upper body assist Assist Level: Independent    Lower Body Dressing/Undressing Lower body dressing      What is the patient wearing?: Pants, Incontinence brief     Lower body assist Assist for lower body dressing: Moderate Assistance - Patient 50 -  74%     Toileting Toileting Toileting Activity did not occur Landscape architect and hygiene only): N/A (no void or bm)  Toileting assist Assist for toileting: 2 Helpers     Transfers Chair/bed transfer  Transfers assist  Chair/bed transfer activity did not occur: Safety/medical concerns  Chair/bed transfer assist level: Moderate Assistance - Patient 50 - 74%     Locomotion Ambulation   Ambulation assist    Ambulation activity did not occur: Safety/medical concerns          Walk 10 feet activity   Assist  Walk 10 feet activity did not occur: Safety/medical concerns        Walk 50 feet activity   Assist Walk 50 feet with 2 turns activity did not occur: Safety/medical concerns         Walk 150 feet activity   Assist Walk 150 feet activity did not occur: Safety/medical concerns         Walk 10 feet on uneven surface  activity   Assist Walk 10 feet on uneven surfaces activity did not occur: Safety/medical concerns         Wheelchair     Assist Will patient use wheelchair at discharge?: Yes Type of Wheelchair: Manual Wheelchair activity did not occur: Safety/medical concerns  Wheelchair assist level: Independent Max wheelchair distance: 150'    Wheelchair 50 feet with 2 turns activity    Assist    Wheelchair 50 feet with 2 turns activity did not occur: Safety/medical concerns   Assist Level: Independent   Wheelchair 150 feet activity     Assist  Wheelchair 150 feet activity did not occur: Safety/medical concerns   Assist Level: Independent   Blood pressure (!) 93/57, pulse 69, temperature 98.1 F (36.7 C), resp. rate 18, height '6\' 2"'  (1.88 m), SpO2 99 %.  Medical Problem List and Plan: 1.C7 Quadriplegia ASIA B and functional deficitssecondary to C7-T1 epidural abscess with associated paraplegia -patient maynot yetshower -ELOS/Goals: min assist PT/OT 28 days  12/2- will maintain on low air loss bed until Monday then reassess Stage II pressure ulcer he came with, patient wishes to switch to another bed he does not find current bed comfortable  12/7- change to regular bed; turn every 2 hours from side to side, since has wound on coccyx/L buttock.  12/8- turing q2 hours   12/14- PT wants abd binder- ordered  12/3- PT doing ACE wraps/TEDs for orthostatic hypotension related to SCI- not using Abd binder 2.  Antithrombotics: -DVT/anticoagulation:Eliquis 11/28- will try and get NSU to allow Lovenox due to  high risk of DVT in SCI patients.  11/29- has B/L gastroc DVTs- started Apixiban 10 mg BID x 1 week then 5 mg BID for DVTs.  -antiplatelet therapy: N/A 3. Pain Management:does not like oxycodone due to SE--will change to ultram prn.Encouragedpatient to take muscle relaxers and Tylenol as needed  12/11- muscle rub added 4. Mood:LCSW to follow for evaluation and support -antipsychotic agents: N/A 5. Neuropsych: This patientiscapable of making decisions onhisown behalf. 6. Skin/Wound Care:Routine pressure relief measureswith local care to sacrum. [place] RN consult as appropriate.  12/2- will con't low air loss bed unti Monday and then reassess- came with this Stage II to rehab  12/7- put back on regular bed and turn q2 hours.   12/8- turning well- and wearing PRAFOs to help feet/stop skin breakdown and prevent foot drop somewhat 7. Fluids/Electrolytes/Nutrition:Monitor I's/O. good po. 8.Acute blood loss anemia: Recheck CBC in a.m.  11/28- Hb 7.8- might  cause more problems with orthostatic hypotension- will need abd binder, TEDs, and possibly Midodrine. Will monitor.   11/29- no SCDs due to DVTs 9.PAF: Monitor heart rate 3 times daily. Continue metoprolol 25 mg twice daily  12/16- will decrease Metoprolol to 12.5 mg BID since BP so low with orthostatic hypotension issues. 10. Neurogenic bladder:  ICP volumes 600-746m- Nsg will need to increase freq to Q4h per orders  12/8- even at q4 hours, pt's volumes at night are 550-925cc- will d/w pt.  12/13 pt doing caths, volumes a little high. Adjust frequency accordingly  12/14- doing q4 hours caths- volumes still high- drinking too much? IV ABX don't help. 11.Neurogenic bowel: Reports p.o. intake has been poor due to change in taste---has been drinking a lot of smoothies. Discussed dietary needs and changes.  Change senna to 2 every morning with suppositories in evening after supper for PM program.   12/2- got bowel program- no results- will monitor closely; also, will get foley out today and start in/out cath program   12/3- will increase bowel meds today- if doesn't have BM tonight, will do mg citrate/enema tomorrow- added miralax and increased senokot to 3 tabs in AM  12/4- ordered mg citrate and in 3 hours do tap water enema- should hopefully work.  12/6 incont mushy BM-large  12/7- need to do all dig stim so doesn't have accidents later on, like today.  1/28- bowel habit hasn't kicked in yet- still having incontinence  11/9- had great BM with bowel program last night- also d/w pt need to keep intake to 2L/day- and showed him what that meant- also d/w nursing that needs to start cathing himself- they will start today.   12/11- rectal clear added in AM  -12/13 results last night, stool too soft though, hold senna and continue miralax   -abx component  12/15 still having bowel accidents in spite of rectal clear/even before.   12/16- still accidents ~ 10-midnight after bowel program- I was taught needs more dig stim??? Will d/w nursing. 12. Leucocytosis in setting of Epidural abscess- on IV ABX x 8 weeks: Follow up CBC in am.  11/28- resolved leukocytosis- will monitor labs at least weekly-  121- ESR 82; CRP 11.6- will monitor weekly for now.  13. Hyperglycemia: Stress Induced hyperglycemia v/s IPG--will check Hgb A1C in am.  11/29- A1c 5.6 #14.  Normochromic normocytic anemia we will check stool guaiac given anticoagulants, start iron supplement  12/7- back up to 8.4- doing better/is recovering some   12/14- Hb up to 9.1 15. Spasticity  12/15- not enough to need meds as of yet.      LOS: 19 days A FACE TO FACE EVALUATION WAS PERFORMED  Reign Dziuba 11/06/2019, 9:08 AM

## 2019-11-06 NOTE — Progress Notes (Signed)
2220 pt was checked for BM. Pt has large amount of stool in brief at the time. During the clean/peri care pt had 2 more medium size BM- thick liquid consistency. Wound was cleansed and  Dressing changed on the buttocks. Pt shows signs of frustration with certain self cath kits, and having difficulty with the pre-lubed cath. Several different types of self cath kits brought to the pts room to try on future in/outs.

## 2019-11-06 NOTE — Progress Notes (Signed)
Social Work Patient ID: Ryan Reed, male   DOB: Jul 31, 1951, 68 y.o.   MRN: [id number]  Have reviewed team conference with pt who is aware we continue to aim for 12/23 d/c.  Discussed need to choose which cath system he prefers so I can order.  Need to discuss plans for family ed as well.  Virna Livengood, LCSW

## 2019-11-06 NOTE — Plan of Care (Signed)
  Problem: Consults Goal: RH SPINAL CORD INJURY PATIENT EDUCATION Description:  See Patient Education module for education specifics.  Outcome: Progressing   Problem: Consults Goal: RH SPINAL CORD INJURY PATIENT EDUCATION Description:  See Patient Education module for education specifics.  Outcome: Progressing   Problem: SCI BOWEL ELIMINATION Goal: RH STG SCI MANAGE BOWEL PROGRAM W/ASSIST OR AS APPROPRIATE Description: STG SCI Manage bowel program w/assist or as appropriate. Mod Outcome: Progressing   Problem: SCI BLADDER ELIMINATION Goal: RH STG SCI MANAGE BLADDER PROGRAM W/ASSISTANCE Description: Patient will manage bladder with mod assist Outcome: Progressing   Problem: RH SKIN INTEGRITY Goal: RH STG MAINTAIN SKIN INTEGRITY WITH ASSISTANCE Description: STG Maintain Skin Integrity With Assistance. Mod Outcome: Progressing Goal: RH STG ABLE TO PERFORM INCISION/WOUND CARE W/ASSISTANCE Description: STG Able To Perform Incision/Wound Care With Assistance. Max Outcome: Progressing

## 2019-11-06 NOTE — Progress Notes (Signed)
Physical Therapy Session Note  Patient Details  Name: Ryan Reed MRN: [id number] Date of Birth: [birth year]  Today's Date: 11/06/2019 PT Individual Time: [id number] PT Individual Time Calculation (min): 27 min  PT Missed Time: 33 min Missed Time Reason: Nursing care (I/O cath)  Short Term Goals: Week 3:  PT Short Term Goal 1 (Week 3): Pt will perform least restrictive transfer with assist x 1 PT Short Term Goal 2 (Week 3): Pt will maintain sitting balance with SBA x 5 min PT Short Term Goal 3 (Week 3): Pt will demonstrate independence with LE PROM stretching program  Skilled Therapeutic Interventions/Progress Updates:    Pt received seated in bed, reports he is getting ready to have nursing perform bladder scan. NT is able to perform bladder scan on patient and he is ready for I/O cath. RN in room to assist pt with performing I/O cath. Pt missed 33 min of scheduled therapy session due to I/O cath. Pt is dependent to don BLE TED hose and ACE wrapped. Discussed purpose of TEDs and ACE wrapped and described technique for donning ACE wrap. Pt is dependent to don RLE leg loop for time conservation, min A for LLE management in order to don LLE leg loop. Supine to sit with CGA for sitting balance with HOB elevated and pt able to maneuver BLE with use of leg loops. Pt reports this is the first time he has been able to perform bed mobility with such independence. Sitting balance EOB with SBA. Slide board transfer bed to w/c with min A for trunk control, dependent for board placement and removal. Pt is able to use leg loops to lift BLE on w/c leg rests. Pt left seated in w/c in room with needs in reach at end of session. Cotreatment session with recreational therapy.  Therapy Documentation Precautions:  Precautions Precautions: Cervical, Back Restrictions Weight Bearing Restrictions: No    Therapy/Group: Individual Therapy   Excell Seltzer, PT, DPT  11/06/2019, 12:02 PM

## 2019-11-06 NOTE — Progress Notes (Signed)
Occupational Therapy Session Note  Patient Details  Name: Ryan Reed MRN: [id number] Date of Birth: 1951/09/24  Today's Date: 11/06/2019 OT Individual Time: [id number] and 1400-1500 OT Individual Time Calculation (min): 70 min and 60 min   Short Term Goals: Week 3:  OT Short Term Goal 1 (Week 3): Pt will complete clothing management in prep for self-cathing independently OT Short Term Goal 2 (Week 3): Pt will consistently transfer w/c<>EOB with +1 assist in order to decrease caregiver burden OT Short Term Goal 3 (Week 3): Will initiate hands on training with pt's caregivers in prep for d/c OT Short Term Goal 4 (Week 3): Pt will don pants from bed level with set-up  Skilled Therapeutic Interventions/Progress Updates:    Session One: Pt seen for OT ADL Bathing/dressing session. Pt awake in supine upon arrival, agreeable to tx sessio and denying pain. Pt agreeable to   Transfers: Min A sliding board transfer to roll in shower seat with removeable armrests. +2 required to steady equipment. Maxi-sky transfer to transfer back from shower chair to bed due to fatigue and drop in BP, see below for BP details.  Rolling in bed with supervision-CGA for management of LEs to initiate roll. ADL Re-Training: Bathed seated on roll in shower chair. Able to reach underneath in order to complete pericare/buttock hygiene. TED hose were donned for bathing task due to BP management.  Returned to bed to dress. Set-up for UB and LB dressing at bed level.   At end of shower, ~20 minutes of sitting upright in shower chair, pt with complaints of light headedness. BP 83/56. Therefore used lift to transfer back to bed. In supine BP 106/65 and pt voicing feeling better with supine rest break.  Pt left in supine at end of session, all needs in reach.   Session Two: Pt seen for OT session focusing on functional transfers and dynamic sitting balance in prep for ADLs. Pt in supine upon arrival, voiced fatigue but  no other complaints and willing to participate as able.  Transfers: Supine>sitting EOB with min A for management of LEs with pt using leg loops to assist and HOB elevated Sliding board transfer EOB>padded tub bench with cut out. Min-mod A overall with assist for management of LEs and to stbilze equipment.  ADL Re-training: Pt tolerated sitting on tub bench well, supervision with no pain. Discussed use of lateral leans for clothing management and other clothing types to ease toileting task. Pt declined practicing clothing management this session.   Dynamic sitting balance EOB, lateral leans to reach to obtain cones on R/L with pt required to return to midline btwn trials. Completed with guarding assist overall, pt with good use of leading with head to assist in maintaining and re-gaining balance seated EOB. Reaching task to obtain cones placed out in front of him with emphasis on core activation and control, guarding assist provided.  Pt tolerated ~20 minutes of unsupported sitting EOB.  Education provided throughout session regarding modified ADLs, activity progression, DME, continuum of care, bowel/bladder program, reducing caregiver burden and d/c planning.   Therapy Documentation Precautions:  Precautions Precautions: Cervical, Back Restrictions Weight Bearing Restrictions: No   Therapy/Group: Individual Therapy  Cina Klumpp L 11/06/2019, 6:50 AM

## 2019-11-07 ENCOUNTER — Encounter (HOSPITAL_COMMUNITY): Payer: Medicare Other | Admitting: Occupational Therapy

## 2019-11-07 ENCOUNTER — Inpatient Hospital Stay (HOSPITAL_COMMUNITY): Payer: Medicare Other

## 2019-11-07 ENCOUNTER — Ambulatory Visit (HOSPITAL_COMMUNITY): Payer: Medicare Other | Admitting: Physical Therapy

## 2019-11-07 MED ORDER — ADULT MULTIVITAMIN W/MINERALS CH: 1.0000 | ORAL_TABLET | Freq: Every day | ORAL | Status: AC

## 2019-11-07 MED ORDER — CEFTRIAXONE IV (FOR PTA / DISCHARGE USE ONLY): 2.0000 g | Freq: Two times a day (BID) | INTRAVENOUS | 0 refills | Status: AC

## 2019-11-07 MED ORDER — ACETAMINOPHEN 325 MG PO TABS: 325.0000 mg | ORAL_TABLET | ORAL | Status: DC | PRN

## 2019-11-07 NOTE — Progress Notes (Addendum)
Bowel program started 1947. RN inserted suppository. Pt laying left side, did self dig stim 15 minutes later, and RN did dig stim again 15 minutes after that. No results noted. Pt successfully preformed self cath into urinal, needs help to empty.   '@0350' -Pt had medium results from bowel program in brief. While cleaning pt up had two more medium results. Scan at [facility] is 480cc. Pt self cath using hospital kit for 500cc with opening assist and verbal cueing.

## 2019-11-07 NOTE — Progress Notes (Signed)
[place] PHYSICAL MEDICINE & REHABILITATION PROGRESS NOTE   Subjective/Complaints: . No complaints this morning.   Denies pain or constipation.  ROS: Patient denies fever, rash, sore throat, blurred vision, nausea, vomiting, diarrhea, cough, shortness of breath or chest pain, joint or back pain, headache, or mood change.   Objective:   No results found. No results for input(s): WBC, HGB, HCT, PLT in the last 72 hours. No results for input(s): NA, K, CL, CO2, GLUCOSE, BUN, CREATININE, CALCIUM in the last 72 hours.  Intake/Output Summary (Last 24 hours) at 11/07/2019 1619 Last data filed at 11/07/2019 1100 Gross per 24 hour  Intake 360 ml  Output 2300 ml  Net -1940 ml     Physical Exam: Vital Signs Blood pressure 114/63, pulse 65, temperature 98.4 F (36.9 C), temperature source Oral, resp. rate 18, height _0  (1.88 m), weight 89.6 kg, SpO2 98 %.  Physical Exam Constitutional: lying in bed; finished shower; tired;NAD HEENT: EOMI, oral membranes moist Neck: supple Cardiovascular: RRR without murmur. No JVD    Respiratory: CTA Bilaterally without wheezes or rales. Normal effort    GI: BS +, non-tender, non-distended  Musculoskeletal:  General: No deformity.  Comments: tr pedal edema bilaterally Neurological: seeing spasms of B/L LEs R>L occasionally He is alertand oriented to person, place, and time. Nocranial nerve deficit. Intact sensation in UE. Impaired sensation otherwise below chest. Biceps 5/5, triceps 5/5, WE 5/5; grip 4/5; finger abd 4/5 B/L. LLE tr to  0/5  RLE: HF-1-2/5. DTR's absent in LE still.   Skin: Skin iswarmand dry.No rashnoted. He is not diaphoretic.; foley out Noerythema. Pressure injury to buttocks and scrotum--blistered area on right buttock and on underside of scrotum.--not visualized today  Now has pressure injury/DTI seen in middle- Stage II- near coccyx on L and R buttocks and surrounded by large area of shear injury on both  sides with scuffed skin.  Psych: pleasant affect   Assessment/Plan: 1. Functional deficits secondary to C7 quadriplegia/ ASIA B with neurogenic bowel and bladder which require 3+ hours per day of interdisciplinary therapy in a comprehensive inpatient rehab setting.  Physiatrist is providing close team supervision and 24 hour management of active medical problems listed below.  Physiatrist and rehab team continue to assess barriers to discharge/monitor patient progress toward functional and medical goals  Care Tool:  Bathing    Body parts bathed by patient: Right arm, Left arm, Chest, Face, Abdomen, Right upper leg, Right lower leg, Left lower leg, Left upper leg(perineal and bottom already cleaned prior)   Body parts bathed by helper: Buttocks Body parts n/a: Front perineal area, Buttocks   Bathing assist Assist Level: Supervision/Verbal cueing     Upper Body Dressing/Undressing Upper body dressing   What is the patient wearing?: Pull over shirt    Upper body assist Assist Level: Set up assist    Lower Body Dressing/Undressing Lower body dressing      What is the patient wearing?: Pants     Lower body assist Assist for lower body dressing: Supervision/Verbal cueing     Toileting Toileting Toileting Activity did not occur (Clothing management and hygiene only): N/A (no void or bm)  Toileting assist Assist for toileting: 2 Helpers     Transfers Chair/bed transfer  Transfers assist  Chair/bed transfer activity did not occur: Safety/medical concerns  Chair/bed transfer assist level: Minimal Assistance - Patient > 75%     Locomotion Ambulation   Ambulation assist   Ambulation activity did not occur: Safety/medical concerns  Walk 10 feet activity   Assist  Walk 10 feet activity did not occur: Safety/medical concerns        Walk 50 feet activity   Assist Walk 50 feet with 2 turns activity did not occur: Safety/medical concerns          Walk 150 feet activity   Assist Walk 150 feet activity did not occur: Safety/medical concerns         Walk 10 feet on uneven surface  activity   Assist Walk 10 feet on uneven surfaces activity did not occur: Safety/medical concerns         Wheelchair     Assist Will patient use wheelchair at discharge?: Yes Type of Wheelchair: Manual Wheelchair activity did not occur: Safety/medical concerns  Wheelchair assist level: Independent Max wheelchair distance: 150'    Wheelchair 50 feet with 2 turns activity    Assist    Wheelchair 50 feet with 2 turns activity did not occur: Safety/medical concerns   Assist Level: Independent   Wheelchair 150 feet activity     Assist  Wheelchair 150 feet activity did not occur: Safety/medical concerns   Assist Level: Independent   Blood pressure 114/63, pulse 65, temperature 98.4 F (36.9 C), temperature source Oral, resp. rate 18, height _0  (1.88 m), weight 89.6 kg, SpO2 98 %.  Medical Problem List and Plan: 1.C7 Quadriplegia ASIA B and functional deficitssecondary to C7-T1 epidural abscess with associated paraplegia -patient maynot yetshower -ELOS/Goals: min assist PT/OT 28 days  12/2- will maintain on low air loss bed until Monday then reassess Stage II pressure ulcer he came with, patient wishes to switch to another bed he does not find current bed comfortable  12/7- change to regular bed; turn every 2 hours from side to side, since has wound on coccyx/L buttock.  12/8- turing q2 hours   12/14- PT wants abd binder- ordered  12/3- PT doing ACE wraps/TEDs for orthostatic hypotension related to SCI- not using Abd binder 2. Antithrombotics: -DVT/anticoagulation:Eliquis 11/28- will try and get NSU to allow Lovenox due to  high risk of DVT in SCI patients.  11/29- has B/L gastroc DVTs- started Apixiban 10 mg BID x 1 week then 5 mg BID for DVTs.  -antiplatelet therapy:  N/A 3. Pain Management:does not like oxycodone due to SE--will change to ultram prn.Encouragedpatient to take muscle relaxers and Tylenol as needed  12/11- muscle rub added 4. Mood:LCSW to follow for evaluation and support -antipsychotic agents: N/A 5. Neuropsych: This patientiscapable of making decisions onhisown behalf. 6. Skin/Wound Care:Routine pressure relief measureswith local care to sacrum. [place] RN consult as appropriate.  12/2- will con't low air loss bed unti Monday and then reassess- came with this Stage II to rehab  12/7- put back on regular bed and turn q2 hours.   12/8- turning well- and wearing PRAFOs to help feet/stop skin breakdown and prevent foot drop somewhat 7. Fluids/Electrolytes/Nutrition:Monitor I's/O. good po. 8.Acute blood loss anemia: Recheck CBC in a.m.  11/28- Hb 7.8- might cause more problems with orthostatic hypotension- will need abd binder, TEDs, and possibly Midodrine. Will monitor.   11/29- no SCDs due to DVTs 9.PAF: Monitor heart rate 3 times daily. Continue metoprolol 25 mg twice daily  12/16- will decrease Metoprolol to 12.5 mg BID since BP so low with orthostatic hypotension issues. 10. Neurogenic bladder:  ICP volumes 600-742m- Nsg will need to increase freq to Q4h per orders  12/8- even at q4 hours, pt's volumes at night are 550-925cc- will  d/w pt.  12/13 pt doing caths, volumes a little high. Adjust frequency accordingly  12/14- doing q4 hours caths- volumes still high- drinking too much? IV ABX don't help. 11.Neurogenic bowel: Reports p.o. intake has been poor due to change in taste---has been drinking a lot of smoothies. Discussed dietary needs and changes. Change senna to 2 every morning with suppositories in evening after supper for PM program.   12/2- got bowel program- no results- will monitor closely; also, will get foley out today and start in/out cath program   12/3- will increase bowel meds today- if  doesn't have BM tonight, will do mg citrate/enema tomorrow- added miralax and increased senokot to 3 tabs in AM  12/4- ordered mg citrate and in 3 hours do tap water enema- should hopefully work.  12/6 incont mushy BM-large  12/7- need to do all dig stim so doesn't have accidents later on, like today.  1/28- bowel habit hasn't kicked in yet- still having incontinence  11/9- had great BM with bowel program last night- also d/w pt need to keep intake to 2L/day- and showed him what that meant- also d/w nursing that needs to start cathing himself- they will start today.   12/11- rectal clear added in AM  -12/13 results last night, stool too soft though, hold senna and continue miralax   -abx component  12/15 still having bowel accidents in spite of rectal clear/even before.   12/16- still accidents ~ 10-midnight after bowel program- I was taught needs more dig stim??? Will d/w nursing. 12. Leucocytosis in setting of Epidural abscess- on IV ABX x 8 weeks: Follow up CBC in am.  11/28- resolved leukocytosis- will monitor labs at least weekly-  121- ESR 82; CRP 11.6- will monitor weekly for now.  13. Hyperglycemia: Stress Induced hyperglycemia v/s IPG--will check Hgb A1C in am.  11/29- A1c 5.6 #14.  Normochromic normocytic anemia we will check stool guaiac given anticoagulants, start iron supplement  12/7- back up to 8.4- doing better/is recovering some   12/14- Hb up to 9.1 15. Spasticity  12/15- not enough to need meds as of yet.      LOS: 20 days A FACE TO FACE EVALUATION WAS PERFORMED  Clide Deutscher Kenidi Elenbaas 11/07/2019, 4:19 PM

## 2019-11-07 NOTE — Progress Notes (Signed)
Occupational Therapy Session Note  Patient Details  Name: Ryan Reed MRN: [id number] Date of Birth: 11/21/1951  Today's Date: 11/07/2019 OT Individual Time: 1257-1345 OT Individual Time Calculation (min): 48 min    Short Term Goals: Week 3:  OT Short Term Goal 1 (Week 3): Pt will complete clothing management in prep for self-cathing independently OT Short Term Goal 2 (Week 3): Pt will consistently transfer w/c<>EOB with +1 assist in order to decrease caregiver burden OT Short Term Goal 3 (Week 3): Will initiate hands on training with pt's caregivers in prep for d/c OT Short Term Goal 4 (Week 3): Pt will don pants from bed level with set-up  Skilled Therapeutic Interventions/Progress Updates:    patient in bed, wife present for family education session.  Patient able to share information regarding needs/abilities for daily care routine and communicates immediate needs well during activity.  Reviewed positioning, BP management/use of TEDS and ace wraps (also abdominal binder), AD s/s, bowel program set up/DME (provided self inspection mirror), ADL (location and status), transfers with SB, options for bathroom remodel to consider for ease of transfer and access to sink.   Demonstrated ace wrapping, bed mobility, sitting balance and guarding, SB transfer bed to w/c, weight shifts/pressure relief, demonstrated use and set up of padded shower bench/commode seat (wife states that she is ordering the same one that he has recently used)   Wife did not have an opportunity to complete hands on transfer - recommend ongoing FT.  Patient remained in w/c at close of session, call bell in reach.    Therapy Documentation Precautions:  Precautions Precautions: Cervical, Back Restrictions Weight Bearing Restrictions: No General:   Vital Signs: Therapy Vitals Temp: 98.4 F (36.9 C) Temp Source: Oral Pulse Rate: 65 Resp: 18 BP: 114/63 Patient Position (if appropriate): Lying Oxygen  Therapy SpO2: 98 % O2 Device: Room Air Pain: Pain Assessment Pain Scale: 0-10 Pain Score: 0-No pain   Therapy/Group: Individual Therapy  Carlos Levering 11/07/2019, 3:58 PM

## 2019-11-07 NOTE — Progress Notes (Signed)
Bowel program completed following initiation by day shift nurse. Digital stimulation performed. Small amount of stool expressed after first pass. Waited 15 minutes and performed second digital stimulation. Small amount of stool passed. No acute distress noted. Peri care performed, patient repositioned in bed with call light in reach, bed alarm on, and bed in lowest position.

## 2019-11-07 NOTE — Progress Notes (Signed)
Physical Therapy Session Note  Patient Details  Name: Ryan Reed MRN: [id number] Date of Birth: [birth year]  Today's Date: 11/07/2019 PT Individual Time: 0830-0930 PT Individual Time Calculation (min): 60 min   Short Term Goals: Week 3:  PT Short Term Goal 1 (Week 3): Pt will perform least restrictive transfer with assist x 1 PT Short Term Goal 2 (Week 3): Pt will maintain sitting balance with SBA x 5 min PT Short Term Goal 3 (Week 3): Pt will demonstrate independence with LE PROM stretching program  Skilled Therapeutic Interventions/Progress Updates:   Focused on bathing and dressing at bed and sink level with focus on pt directing care and increasing independence. Pt utilized hospital bed features and able to wash lower body with set up assist managing BLE independently. Pt also donned pants at bed level with set up assist. Total assist to don tedhose and ACE wraps for BP management. Pt able to don leg loops with minimal assist for support of legs but able to position and fasten all pieces of leg loops from bed level. Pt performed supine to sit with bed rails and leg loops with supervision and pt reported feeling like he had a BM so returned to supine with mod assist for BLE management. Pt did not have BM, returned to seated EOB with supervision again using bedrails and legloops. Pt able to maintain sitting balance with close supervision to CGA. Transferred to w/c with slideboard with overall min assist for balance with cues for management of BLE with leg loops. Pt able to don leg rests with set up assist. Performed upper body bathing at sink level with set up assist (except to wash back) and donned new shirt. Pt performed grooming tasks independently at sink. Discussed and educated on transition home throughout.   Therapy Documentation Precautions:  Precautions Precautions: Cervical, Back Restrictions Weight Bearing Restrictions: No   Pain:  reports pain in low back like it's  "dislocated". Medication given this morning.    Therapy/Group: Individual Therapy and Co-Treatment with Rec Therapy  Canary Brim Ivory Broad, PT, DPT, CBIS  11/07/2019, 10:36 AM

## 2019-11-07 NOTE — Progress Notes (Signed)
Digital stimulation was witnessed for patient, A small amount of stool was expressed after the first pass. Nothing noted after the 2nd pass. Patient was left in care of his nurse for the rest of the procedure. No acute distress noted

## 2019-11-07 NOTE — Progress Notes (Signed)
Recreational Therapy Session Note  Patient Details  Name: Ryan Reed MRN: [id number] Date of Birth: [birth year] Today's Date: 11/07/2019 Time:  9-930 & [id number] Pain: c/o low back pain, states "it feels dislocated"- premedicated 2nd session:  C/o abdominal tightness, suspect from AD, muscle relaxer administered by RN Skilled Therapeutic Interventions/Progress Updates: Session 1:  Pt in bed working on bed level bathing and dressing tasks with PT.  Session focued on pt pt directing his care. Pt required set up assist bed level for lower body.  Pt required total assist to don TED hose and min assist to support LE's as pt applied leg loops.  Pt performed slide board transfer to w/c with min assist/min verbal cues for LE management.  Pt completed bathing/dressing task w/c level at the sink with set up assist with exception of washing back.  Also discussed discharge planning/transition to home.  Session 2:  Pt in bed, RN at bedside.  Pt reporting previous episode of AD in which he had to call the nurse and return to bed prior to this session.   Discussed symptoms and what to do if pt experienced those same symptoms at home.  Discussed use of a notebook/journal to list events and/or symptoms that he experienced to see if he could determine a pattern or similarities with episodes to assist with management at home.  Pt stated understanding.  Pt eager to attempt OOB activity although still with some abdominal discomfort.  BP assessed (see PT note)  Pt performed slide board transfer with min assist to w/c.  Pt propelled w/c on the unit using BUEs with supervision.  Pt then transferred to and from the therapy mat with the slide board and min assist.  Pt sat EOM using 1 lb weighted bar to tap a ball back and forth with LRT with focus on activity tolerance & sitting balance with close supervision- min assist.  Pt did experience 1 LOB, needing mod assist to recover. Therapy/Group:  Co-Treatment  Treylon Henard 11/07/2019, 3:18 PM

## 2019-11-07 NOTE — Progress Notes (Signed)
Pt c/o intense abd pain and was sweating. Pt was sitting up right in w/c. Transferred back to bed and did dig stim with large results. Stool was green and mushy. Pt reports feeling better but still feels tight. Pain and sweating has resolved.

## 2019-11-07 NOTE — Progress Notes (Signed)
Physical Therapy Session Note  Patient Details  Name: Ryan Reed MRN: [id number] Date of Birth: 1951/07/04  Today's Date: 11/07/2019 PT Individual Time: 1345-1438 PT Individual Time Calculation (min): 53 min   Short Term Goals: Week 3:  PT Short Term Goal 1 (Week 3): Pt will perform least restrictive transfer with assist x 1 PT Short Term Goal 2 (Week 3): Pt will maintain sitting balance with SBA x 5 min PT Short Term Goal 3 (Week 3): Pt will demonstrate independence with LE PROM stretching program  Skilled Therapeutic Interventions/Progress Updates:    Pt received seated in w/c in room with wife [name] present for hands-on family education. No complaints of pain. Pt is able to perform w/c mobility x 150 ft (+) with use of BUE at mod I level. Pt is independent with management of w/c parts. Demonstration of how to assist pt with car transfer, mod A via use of slide board going uphill into car and for BLE management in/out of car. Per pt and his wife they are able to borrow a handicap accessible [employer] for a few months that he can enter via his w/c so they will not need to perform car transfers immediately. Slide board transfer w/c to/from level mat table with min A. Pt's wife is able to perform transfer with mod cueing from therapist for safe slide board placement, body mechanics, and how to best assist pt with weight shift. Pt is able to perform transfer lifting up buttocks and just needs assist for LE placement and trunk control during transfer. Slide board transfer back to bed min A with pt's wife assisting and therapist providing cues. Sit to supine mod A for BLE management due to pt fatigue and unable to lift BLE with use of leg lifters. Will continue with family education this weekend with patient's wife and daughter leading up to d/c next week. Also reiterated importance of family being present for training with RN with regards to bowel and bladder management. Pt left semi-reclined in bed  with needs in reach and wife present at end of session.  Therapy Documentation Precautions:  Precautions Precautions: Cervical, Back Restrictions Weight Bearing Restrictions: No    Therapy/Group: Individual Therapy   Excell Seltzer, PT, DPT  11/07/2019, 3:47 PM

## 2019-11-07 NOTE — Progress Notes (Signed)
Physical Therapy Session Note  Patient Details  Name: Ryan Reed MRN: [id number] Date of Birth: [birth year]  Today's Date: 11/07/2019 PT Individual Time: 1030-1115 PT Individual Time Calculation (min): 45 min   Short Term Goals: Week 2:  PT Short Term Goal 1 (Week 2): Pt will recall 3 pressure relief techniques and pressure relief schedule PT Short Term Goal 1 - Progress (Week 2): Met PT Short Term Goal 2 (Week 2): Pt will perform bed mobility with assist x 1 PT Short Term Goal 2 - Progress (Week 2): Met PT Short Term Goal 3 (Week 2): Pt will maintain sitting balance with min A x 5 min PT Short Term Goal 3 - Progress (Week 2): Met Week 3:  PT Short Term Goal 1 (Week 3): Pt will perform least restrictive transfer with assist x 1 PT Short Term Goal 2 (Week 3): Pt will maintain sitting balance with SBA x 5 min PT Short Term Goal 3 (Week 3): Pt will demonstrate independence with LE PROM stretching program  Skilled Therapeutic Interventions/Progress Updates:    Pt reporting incident with AD and back in bed. Discussed signs and symptoms and positive that patient was able to identify after the fact. Education and discussion about what to do in case this occurs at home and suggested keeping a notebook to write down events to allow pt to track and become more familiar with occurences. Pt agreeable to get up OOB and eager to try to participate. BP assessed (see below) and symptoms monitored throughout session. Pt with decreased overall energy level. Performed bed mobility with supervision using leg loops during session to come to EOB. Pt performed min assist for slideboard transfers (4 total and 1 was light mod assist due to decreased balance) with focus on technique, foot placement (still requires cues and assist for this consistently), and overall balance during transitions. NMR for trunk control and balance retraining on EOM with close supervision to min assist overall (1 episode of LOB  requiring mod assist) while performing ball toss while holding 1# straight weight to hit x 3 trials with rest break between sets. Returned back to bed with transfer as described above with wife at bedside and relayed information about AD episode earlier. Pt able to return to supine with supervision using leg loops. Left in care of RN to address cathing needs.   Therapy Documentation Precautions:  Precautions Precautions: Cervical, Back Restrictions Weight Bearing Restrictions: No Vital Signs: Therapy Vitals BP: (P) 108/70 Patient Position (if appropriate): (P) Lying Pain: Denies pain. Reports that he just had an episode of AD with abdominal pain symptoms and ended up having a BP.    Therapy/Group: Individual Therapy  Canary Brim Ivory Broad, PT, DPT, CBIS  11/07/2019, 12:07 PM

## 2019-11-07 NOTE — Progress Notes (Signed)
  Patient ID: Ryan Reed, male   DOB: 10-Feb-1951, 68 y.o.   MRN: [id number]    Diagnosis code:  G82.50;  G06.1  Height:  6'2"  Weight:   198 lbs   Patient has quadriplegia due to spinal abscess which requires extremities  to be positioned in ways not feasible with a normal bed and to allow for safe bed to wheelchair transfers.  Patient requires frequent and immediate changes in body position which cannot be achieved with a normal bed to allow for pressure relieving and prevention of skin deterioration.  Reesa Chew, PA-C

## 2019-11-07 NOTE — Progress Notes (Signed)
Bowel program: rectal vault cleared, dig stim performed with med mushy brown results. Suppository gvn. Dig stim performed with very little results.

## 2019-11-08 ENCOUNTER — Inpatient Hospital Stay (HOSPITAL_COMMUNITY): Payer: Medicare Other | Admitting: Physical Therapy

## 2019-11-08 ENCOUNTER — Inpatient Hospital Stay (HOSPITAL_COMMUNITY): Payer: Medicare Other | Admitting: Occupational Therapy

## 2019-11-08 ENCOUNTER — Inpatient Hospital Stay (HOSPITAL_COMMUNITY): Payer: Medicare Other

## 2019-11-08 MED ORDER — POLYETHYLENE GLYCOL 3350 17 G PO PACK: 17.0000 g | PACK | Freq: Two times a day (BID) | ORAL | Status: DC

## 2019-11-08 MED ORDER — MAGNESIUM HYDROXIDE 400 MG/5ML PO SUSP
30.0000 mL | Freq: Once | ORAL | Status: AC
  Administered 2019-11-08: 30 mL via ORAL
  Filled 2019-11-08: qty 30

## 2019-11-08 MED ORDER — SENNA 8.6 MG PO TABS
2.0000 | ORAL_TABLET | Freq: Every day | ORAL | Status: DC
  Administered 2019-11-09 – 2019-11-13 (×5): 17.2 mg via ORAL
  Filled 2019-11-08 (×5): qty 2

## 2019-11-08 MED ORDER — MAGNESIUM HYDROXIDE NICU ORAL SYRINGE 400 MG/5 ML
30.0000 mL | Freq: Once | ORAL | Status: DC
  Filled 2019-11-08: qty 30

## 2019-11-08 NOTE — Progress Notes (Signed)
Soap sud enema was administered with good results. Xlarge brown stool

## 2019-11-08 NOTE — Progress Notes (Signed)
Physical Therapy Weekly Progress Note  Patient Details  Name: Ryan Reed MRN: [id number] Date of Birth: May 28, 1951  Beginning of progress report period: November 01, 2019 End of progress report period: November 08, 2019  Today's Date: 11/08/2019 PT Individual Time: 1100-1200 PT Individual Time Calculation (min): 60 min   Patient has met 2 of 3 short term goals.  Pt continues to make great progress towards LTG. Pt is able to perform bed mobility with Supervision to CGA with use of leg loops and hospital bed features. Pt is able to maintain sitting balance with SBA up to 5 min. Pt is at min A level for slide board transfers and demos good ability to clear buttocks during transfer. Pt is at mod I level for w/c mobility and management of w/c parts. Pt is making progress towards independence with BLE HEP PROM. Weekly focus has been family education with patient's [name]. She has been present for several therapy sessions and has been able to assist pt with donning TED hose and BLE ACE wraps, donning leg loops, and performing slide board transfers bed to/from w/c.  Patient continues to demonstrate the following deficits muscle weakness, muscle joint tightness and muscle paralysis, abnormal tone and unbalanced muscle activation and decreased sitting balance, decreased postural control and decreased balance strategies and therefore will continue to benefit from skilled PT intervention to increase functional independence with mobility.  Patient progressing toward long term goals..  Continue plan of care.  PT Short Term Goals Week 3:  PT Short Term Goal 1 (Week 3): Pt will perform least restrictive transfer with assist x 1 PT Short Term Goal 1 - Progress (Week 3): Met PT Short Term Goal 2 (Week 3): Pt will maintain sitting balance with SBA x 5 min PT Short Term Goal 2 - Progress (Week 3): Met PT Short Term Goal 3 (Week 3): Pt will demonstrate independence with LE PROM stretching program PT  Short Term Goal 3 - Progress (Week 3): Progressing toward goal Week 4:  PT Short Term Goal 1 (Week 4): =LTG due to ELOS  Skilled Therapeutic Interventions/Progress Updates:    Pt received supine in bed, agreeable to PT session. No complaints of pain. Pt's wife present for ongoing family education. Reviewed how to assist pt with donning BLE ACE wraps and purpose of BP management. Provided handout for ACE wrapping BLE. Pt's wife is able to assist pt with donning wraps with mod cueing and demonstration of correct technique. Pt is then able to don leg loops and shoes independently with increased time while in long-sitting position. Long-sitting to sitting EOB with Supervision with increased time needed and HOB elevated. Slide board transfer bed to/from w/c x 6 reps with pt's wife providing min A. Reviewed Beezy board and slide board placement for transfers and how to provide min A during transfer as well as management of BLE. Will continue with family education leading up to pt d/c next week. Sit to supine CGA for balance, pt is able to manage BLE with use of leg loops. Pt left semi-reclined in bed with needs in reach at end of session.  Therapy Documentation Precautions:  Precautions Precautions: Cervical, Back Restrictions Weight Bearing Restrictions: No   Therapy/Group: Individual Therapy   Excell Seltzer, PT, DPT 11/08/2019, 12:55 PM

## 2019-11-08 NOTE — Progress Notes (Signed)
[place] PHYSICAL MEDICINE & REHABILITATION PROGRESS NOTE   Subjective/Complaints: No complaints this morning.   Denies pain or constipation. Says that yesterday he felt stomach cramps when he sat up in his chair with associated diaphoresis. He said he was attended to by nursing staff and the episode resolved.   ROS: Patient denies fever, rash, sore throat, blurred vision, nausea, vomiting, diarrhea, cough, shortness of breath or chest pain, joint or back pain, headache, or mood change.   Objective:   No results found. No results for input(s): WBC, HGB, HCT, PLT in the last 72 hours. No results for input(s): NA, K, CL, CO2, GLUCOSE, BUN, CREATININE, CALCIUM in the last 72 hours.  Intake/Output Summary (Last 24 hours) at 11/08/2019 1215 Last data filed at 11/08/2019 1000 Gross per 24 hour  Intake 250 ml  Output 1590 ml  Net -1340 ml     Physical Exam: Vital Signs Blood pressure 109/69, pulse 61, temperature 98.7 F (37.1 C), temperature source Oral, resp. rate 18, height '6\' 2"'  (1.88 m), weight 89.6 kg, SpO2 99 %.  Physical Exam Constitutional: lying in bed; finished shower; tired;NAD HEENT: EOMI, oral membranes moist Neck: supple Cardiovascular: RRR without murmur. No JVD    Respiratory: CTA Bilaterally without wheezes or rales. Normal effort    GI: BS +, non-tender, non-distended  Musculoskeletal:  General: No deformity.  Comments: tr pedal edema bilaterally Neurological: seeing spasms of B/L LEs R>L occasionally He is alertand oriented to person, place, and time. Nocranial nerve deficit. Intact sensation in UE. Impaired sensation otherwise below chest. Biceps 5/5, triceps 5/5, WE 5/5; grip 4/5; finger abd 4/5 B/L. LLE tr to  0/5  RLE: HF-1-2/5. DTR's absent in LE still.   Skin: Skin iswarmand dry.No rashnoted. He is not diaphoretic.; foley out Noerythema. Pressure injury to buttocks and scrotum--blistered area on right buttock and on underside of  scrotum.--not visualized today  Now has pressure injury/DTI seen in middle- Stage II- near coccyx on L and R buttocks and surrounded by large area of shear injury on both sides with scuffed skin.  Psych: pleasant affect   Assessment/Plan: 1. Functional deficits secondary to C7 quadriplegia/ ASIA B with neurogenic bowel and bladder which require 3+ hours per day of interdisciplinary therapy in a comprehensive inpatient rehab setting.  Physiatrist is providing close team supervision and 24 hour management of active medical problems listed below.  Physiatrist and rehab team continue to assess barriers to discharge/monitor patient progress toward functional and medical goals  Care Tool:  Bathing    Body parts bathed by patient: Right arm, Left arm, Chest, Face, Abdomen, Right upper leg, Right lower leg, Left lower leg, Left upper leg, Front perineal area, Buttocks   Body parts bathed by helper: Buttocks Body parts n/a: Front perineal area, Buttocks   Bathing assist Assist Level: Supervision/Verbal cueing     Upper Body Dressing/Undressing Upper body dressing   What is the patient wearing?: Pull over shirt    Upper body assist Assist Level: Contact Guard/Touching assist Assistive Device Comment: Sitting EOB  Lower Body Dressing/Undressing Lower body dressing      What is the patient wearing?: Pants, Underwear/pull up     Lower body assist Assist for lower body dressing: Supervision/Verbal cueing     Toileting Toileting Toileting Activity did not occur (Clothing management and hygiene only): N/A (no void or bm)  Toileting assist Assist for toileting: 2 Helpers     Transfers Chair/bed transfer  Transfers assist  Chair/bed transfer activity did  not occur: Safety/medical concerns  Chair/bed transfer assist level: Minimal Assistance - Patient > 75%     Locomotion Ambulation   Ambulation assist   Ambulation activity did not occur: Safety/medical concerns           Walk 10 feet activity   Assist  Walk 10 feet activity did not occur: Safety/medical concerns        Walk 50 feet activity   Assist Walk 50 feet with 2 turns activity did not occur: Safety/medical concerns         Walk 150 feet activity   Assist Walk 150 feet activity did not occur: Safety/medical concerns         Walk 10 feet on uneven surface  activity   Assist Walk 10 feet on uneven surfaces activity did not occur: Safety/medical concerns         Wheelchair     Assist Will patient use wheelchair at discharge?: Yes Type of Wheelchair: Manual Wheelchair activity did not occur: Safety/medical concerns  Wheelchair assist level: Independent Max wheelchair distance: 150'    Wheelchair 50 feet with 2 turns activity    Assist    Wheelchair 50 feet with 2 turns activity did not occur: Safety/medical concerns   Assist Level: Independent   Wheelchair 150 feet activity     Assist  Wheelchair 150 feet activity did not occur: Safety/medical concerns   Assist Level: Independent   Blood pressure 109/69, pulse 61, temperature 98.7 F (37.1 C), temperature source Oral, resp. rate 18, height '6\' 2"'  (1.88 m), weight 89.6 kg, SpO2 99 %.  Medical Problem List and Plan: 1.C7 Quadriplegia ASIA B and functional deficitssecondary to C7-T1 epidural abscess with associated paraplegia -patient maynot yetshower -ELOS/Goals: min assist PT/OT 28 days  12/2- will maintain on low air loss bed until Monday then reassess Stage II pressure ulcer he came with, patient wishes to switch to another bed he does not find current bed comfortable  12/7- change to regular bed; turn every 2 hours from side to side, since has wound on coccyx/L buttock.  12/8- turing q2 hours   12/14- PT wants abd binder- ordered  12/3- PT doing ACE wraps/TEDs for orthostatic hypotension related to SCI- not using Abd binder 2.  Antithrombotics: -DVT/anticoagulation:Eliquis 11/28- will try and get NSU to allow Lovenox due to  high risk of DVT in SCI patients.  11/29- has B/L gastroc DVTs- started Apixiban 10 mg BID x 1 week then 5 mg BID for DVTs.  -antiplatelet therapy: N/A 3. Pain Management:does not like oxycodone due to SE--will change to ultram prn.Encouragedpatient to take muscle relaxers and Tylenol as needed  12/11- muscle rub added 4. Mood:LCSW to follow for evaluation and support -antipsychotic agents: N/A 5. Neuropsych: This patientiscapable of making decisions onhisown behalf. 6. Skin/Wound Care:Routine pressure relief measureswith local care to sacrum. [place] RN consult as appropriate.  12/2- will con't low air loss bed unti Monday and then reassess- came with this Stage II to rehab  12/7- put back on regular bed and turn q2 hours.   12/8- turning well- and wearing PRAFOs to help feet/stop skin breakdown and prevent foot drop somewhat 7. Fluids/Electrolytes/Nutrition:Monitor I's/O. good po. 8.Acute blood loss anemia: Recheck CBC in a.m.  11/28- Hb 7.8- might cause more problems with orthostatic hypotension- will need abd binder, TEDs, and possibly Midodrine. Will monitor.   11/29- no SCDs due to DVTs 9.PAF: Monitor heart rate 3 times daily. Continue metoprolol 25 mg twice daily  12/16- will decrease  Metoprolol to 12.5 mg BID since BP so low with orthostatic hypotension issues. 10. Neurogenic bladder:  ICP volumes 600-761m- Nsg will need to increase freq to Q4h per orders  12/8- even at q4 hours, pt's volumes at night are 550-925cc- will d/w pt.  12/13 pt doing caths, volumes a little high. Adjust frequency accordingly  12/14- doing q4 hours caths- volumes still high- drinking too much? IV ABX don't help. 11.Neurogenic bowel: Reports p.o. intake has been poor due to change in taste---has been drinking a lot of smoothies. Discussed dietary needs and changes.  Change senna to 2 every morning with suppositories in evening after supper for PM program.   12/2- got bowel program- no results- will monitor closely; also, will get foley out today and start in/out cath program   12/3- will increase bowel meds today- if doesn't have BM tonight, will do mg citrate/enema tomorrow- added miralax and increased senokot to 3 tabs in AM  12/4- ordered mg citrate and in 3 hours do tap water enema- should hopefully work.  12/6 incont mushy BM-large  12/7- need to do all dig stim so doesn't have accidents later on, like today.  1/28- bowel habit hasn't kicked in yet- still having incontinence  11/9- had great BM with bowel program last night- also d/w pt need to keep intake to 2L/day- and showed him what that meant- also d/w nursing that needs to start cathing himself- they will start today.   12/11- rectal clear added in AM  -12/13 results last night, stool too soft though, hold senna and continue miralax   -abx component  12/15 still having bowel accidents in spite of rectal clear/even before.   12/16- still accidents ~ 10-midnight after bowel program- I was taught needs more dig stim??? Will d/w nursing. 12. Leucocytosis in setting of Epidural abscess- on IV ABX x 8 weeks: Follow up CBC in am.  11/28- resolved leukocytosis- will monitor labs at least weekly-  121- ESR 82; CRP 11.6- will monitor weekly for now.  13. Hyperglycemia: Stress Induced hyperglycemia v/s IPG--will check Hgb A1C in am.  11/29- A1c 5.6 #14.  Normochromic normocytic anemia we will check stool guaiac given anticoagulants, start iron supplement  12/7- back up to 8.4- doing better/is recovering some   12/14- Hb up to 9.1 15. Spasticity  12/15- not enough to need meds as of yet. 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 Appreciate RT's education regarding AD during session 2 yesterday. Has noted  abdominal discomfort and as per nursing notes, small amounts of stool have been expressed with dig stim. Will increase daily miralax to BID to prevent constipation which may be contributing to AD.       LOS: 21 days A FACE TO FACE EVALUATION WAS PERFORMED  KMartha ClanP Jla Reynolds 11/08/2019, 12:15 PM

## 2019-11-08 NOTE — Plan of Care (Signed)
  Problem: RH Tub/Shower Transfers Goal: LTG Patient will perform tub/shower transfers w/assist (OT) Description: LTG: Patient will perform tub/shower transfers with assist, with/without cues using equipment (OT) Outcome: Not Applicable Flowsheets (Taken 11/08/2019 1738) LTG: Pt will perform tub/shower stall transfers with assistance level of: (Modified 12/18-AR) -- Note: Goal d/c as not recommending bathing at shower level at d/c. Ryan Reed, OTR/L

## 2019-11-08 NOTE — Progress Notes (Signed)
Physical Therapy Session Note  Patient Details  Name: Ryan Reed MRN: [id number] Date of Birth: [birth year]  Today's Date: 11/08/2019 PT Individual Time: 1600-1700 PT Individual Time Calculation (min): 60 min   Short Term Goals: Week 4:  PT Short Term Goal 1 (Week 4): =LTG due to ELOS  Skilled Therapeutic Interventions/Progress Updates:   Pt received supine in bed and agreeable to PT. PT assisted pt to don shoes with min assist on the R and max assist on the L due to hip stiffness. Supine>sit transfer with min assist to maintain balance with BLE at EOB. SB transfer to [facility] with min assist and set up from PT to place SB. Cues for increased lift off gluteal surface and prevent sheer forces. WC mobility through rehab unit with distant supervision assist in loaner WC x 315f, 2570fand 20015fMin cues for WC parts management and improved pelvic positioning to prevent arm rest from dropping. PT also instructed pt in partial WC with anti-tippers in place to allow pt to manage rugs and transition strips in house and encouraged pt to remove rugs if possible. UBE 4 min forward/4 min reverse with prolonged rest break between bouts. Pt returned to room and performed SB transfer to bed with min assist overall. Sit>supine completed with min assist for assist with LLE management, and left supine in bed with call bell in reach and all needs met.        Therapy Documentation Precautions:  Precautions Precautions: Cervical, Back Restrictions Weight Bearing Restrictions: No Pain: Denies    Therapy/Group: Individual Therapy  AusLorie Phenix/18/2020, 5:31 PM

## 2019-11-08 NOTE — Progress Notes (Addendum)
Patient's rectal vault empty this evening. Performed digital stimulation with no results. Denies any pain. Patient comfortable.

## 2019-11-08 NOTE — Progress Notes (Signed)
Occupational Therapy Weekly Progress Note  Patient Details  Name: Ryan Reed MRN: [id number] Date of Birth: July 24, 1951  Beginning of progress report period: October 31, 2019 End of progress report period: November 08, 2019  Today's Date: 11/08/2019 OT Individual Time: 1572-6203 OT Individual Time Calculation (min): 70 min    Patient has met 3 of 3 short term goals.  Pt cont to make steady progress towards OT goals. He is completing sliding board transfers at overall min A for balance and placing/stabilzing of equipment. Bathing/dressing from bed level with set-up and use of hospital bed functions. Beginning to trial bathing/dressing on padded tub bench with cutout, however, at this time recommend bed level at d/c for safety and independence. Have initiated caregiver training with pt's wife, will cont family ed sessions in prep for d/c home next week.   Patient continues to demonstrate the following deficits: muscle weakness and muscle paralysis, decreased cardiorespiratoy endurance and decreased sitting balance, decreased postural control and decreased balance strategies and therefore will continue to benefit from skilled OT intervention to enhance overall performance with BADL and Reduce care partner burden.  Patient progressing toward long term goals..  Plan of care revisions: Tub/shower transfer goal d/c.  OT Short Term Goals Week 3:  OT Short Term Goal 1 (Week 3): Pt will complete clothing management in prep for self-cathing independently OT Short Term Goal 1 - Progress (Week 3): Met OT Short Term Goal 2 (Week 3): Pt will consistently transfer w/c<>EOB with +1 assist in order to decrease caregiver burden OT Short Term Goal 2 - Progress (Week 3): Met OT Short Term Goal 3 (Week 3): Will initiate hands on training with pt's caregivers in prep for d/c OT Short Term Goal 4 (Week 3): Pt will don pants from bed level with set-up OT Short Term Goal 4 - Progress (Week 3): Met Week 4:   OT Short Term Goal 1 (Week 4): STG=LTG due to LOS  Skilled Therapeutic Interventions/Progress Updates:    Pt seen for OT ADL bathing/dressing session. Pt awake in supine upon arrival, agreeable to tx session, requesting bathing/dressing.  Transfers: Rolling with CGA for management of LEs using hospital bed functions Supine>sitting EOB with min A for LEs.  Sitting>supine mod A for LE management  ADL Re-training LB bathing bed level using hospital bed functions completed with set-up/supervision in supported circle sitting position. Returned to supine to complete pericare/buttock hygiene with set-up and assist to don brief. Donned pants from circle sit position with set-up, rolling with use of bed rail to pull up pants. TED hose and ACE wraps donned total A and shoes donned with set-up. Completed UB bathing/dressing seated EOB in order to address dynamic sitting balance and core strengthening/stability. Min A overall for balance EOB during functional tasks. Supervision for static sitting balance. Pt tolerated ~30 minutes of unsupported sitting on EOB with tactile ceus provided throughout for erect posture.   Education throughout session regarding ADL re-training, activity progression, bowel/bladder program, family education and d/c planning.  Pt requesting to remain in supine at end of session, left with all needs in reach and bed alarm on.    Therapy Documentation Precautions:  Precautions Precautions: Cervical, Back Restrictions Weight Bearing Restrictions: No   Therapy/Group: Individual Therapy  Ryan Reed L 11/08/2019, 7:00 AM

## 2019-11-09 ENCOUNTER — Ambulatory Visit (HOSPITAL_COMMUNITY): Payer: Medicare Other | Admitting: Physical Therapy

## 2019-11-09 DIAGNOSIS — K592 Neurogenic bowel, not elsewhere classified: Secondary | ICD-10-CM

## 2019-11-09 DIAGNOSIS — G062 Extradural and subdural abscess, unspecified: Secondary | ICD-10-CM

## 2019-11-09 DIAGNOSIS — K5900 Constipation, unspecified: Secondary | ICD-10-CM

## 2019-11-09 DIAGNOSIS — N319 Neuromuscular dysfunction of bladder, unspecified: Secondary | ICD-10-CM

## 2019-11-09 DIAGNOSIS — K5901 Slow transit constipation: Secondary | ICD-10-CM

## 2019-11-09 NOTE — Progress Notes (Signed)
Bowel Program preformed during 7a shift. Patient had 3 small bowel movements. Adria Devon, LPN

## 2019-11-09 NOTE — Progress Notes (Signed)
Physical Therapy Session Note  Patient Details  Name: Ryan Reed MRN: [id number] Date of Birth: September 27, 1951  Today's Date: 11/09/2019 PT Individual Time: 1300-1400 PT Individual Time Calculation (min): 60 min   Short Term Goals: Week 4:  PT Short Term Goal 1 (Week 4): =LTG due to ELOS  Skilled Therapeutic Interventions/Progress Updates:    Pt received seated in bed, agreeable to PT session. No complaints of pain. Pt's wife and daughter present for hands-on family education session. Reviewed how to assist pt with donning TEDS and BLE ACE wrap with patient's daughter. Provided demonstration and had her perform return demo with min cueing for correct technique. Then assisted pt and his daughter with donning BLE leg loops. Donned shorts and shoes dependently for time conservation. Supine to sit with CGA for trunk control while pt manages BLE with use of leg loops. Demonstrated how to setup w/c and slide board for transfer then performed transfers x 2 with patient providing min A overall and cueing for family. Pt's daughter is then able to perform slide board transfers x 2 with patient with min cueing for correct technique and body mechanics. Pt returns to supine with CGA for trunk control and is able to manage BLE with leg loops. Reviewed handout of BLE PROM with patient's wife and daughter. Pt's wife and daughter with questions about AD, reviewed causes and treatments as well as that it is considered a medical emergency. Pt left semi-reclined in bed with needs in reach at end of session, family present.  Therapy Documentation Precautions:  Precautions Precautions: Cervical, Back Restrictions Weight Bearing Restrictions: No    Therapy/Group: Individual Therapy   Excell Seltzer, PT, DPT  11/09/2019, 4:08 PM

## 2019-11-09 NOTE — Progress Notes (Signed)
[place] PHYSICAL MEDICINE & REHABILITATION PROGRESS NOTE   Subjective/Complaints: Patient seen sitting up in bed this morning.  States he slept well overnight.  He becomes excited when demonstrated the patient that he is now able to wiggle his toes on left foot.  ROS: Denies CP, SOB, N/V/D  Objective:   DG Abd 1 View  Result Date: 11/08/2019 CLINICAL DATA:  Frequent stools. Abdominal pain. EXAM: ABDOMEN - 1 VIEW COMPARISON:  10/18/2019 FINDINGS: Nondistended gas-filled loops of small bowel and colon noted. A moderate amount of stool within the colon is present. No dilated bowel loops are present. No suspicious calcifications are present. No acute bony abnormalities are noted. IMPRESSION: 1. Nonspecific bowel gas pattern. No evidence of bowel obstruction. 2. Moderate colonic stool. Electronically Signed   By: Margarette Canada M.D.   On: 11/08/2019 14:31   No results for input(s): WBC, HGB, HCT, PLT in the last 72 hours. No results for input(s): NA, K, CL, CO2, GLUCOSE, BUN, CREATININE, CALCIUM in the last 72 hours.  Intake/Output Summary (Last 24 hours) at 11/09/2019 1548 Last data filed at 11/09/2019 1521 Gross per 24 hour  Intake 600 ml  Output 2900 ml  Net -2300 ml     Physical Exam: Vital Signs Blood pressure 112/68, pulse (!) 56, temperature 98.2 F (36.8 C), resp. rate 18, height 6\' 2"  (1.88 m), weight 89.6 kg, SpO2 100 %. Constitutional: No distress . Vital signs reviewed. HENT: Normocephalic.  Atraumatic. Eyes: EOMI. No discharge. Cardiovascular: No JVD. Respiratory: Normal effort.  No stridor. GI: Non-distended. Skin: Warm and dry.  Intact. Psych: Normal mood.  Normal behavior. Musc: No edema in extremities.  No tenderness in extremities. Neurological:  Alert Motor: Bilateral upper extremities: 5/5 proximal distal Right lower extremity: 2/5 proximal distally  Left lower extremity: Hip flexion 1/5, 0/5 knee extension 1/5, ankle dorsiflexion 0/5, wiggles toes now    Assessment/Plan: 1. Functional deficits secondary to C7 quadriplegia/ ASIA B with neurogenic bowel and bladder which require 3+ hours per day of interdisciplinary therapy in a comprehensive inpatient rehab setting.  Physiatrist is providing close team supervision and 24 hour management of active medical problems listed below.  Physiatrist and rehab team continue to assess barriers to discharge/monitor patient progress toward functional and medical goals  Care Tool:  Bathing    Body parts bathed by patient: Right arm, Left arm, Chest, Face, Abdomen, Right upper leg, Right lower leg, Left lower leg, Left upper leg, Front perineal area, Buttocks   Body parts bathed by helper: Buttocks Body parts n/a: Front perineal area, Buttocks   Bathing assist Assist Level: Supervision/Verbal cueing     Upper Body Dressing/Undressing Upper body dressing   What is the patient wearing?: Pull over shirt    Upper body assist Assist Level: Contact Guard/Touching assist Assistive Device Comment: Sitting EOB  Lower Body Dressing/Undressing Lower body dressing      What is the patient wearing?: Pants, Underwear/pull up     Lower body assist Assist for lower body dressing: Supervision/Verbal cueing     Toileting Toileting Toileting Activity did not occur (Clothing management and hygiene only): N/A (no void or bm)  Toileting assist Assist for toileting: 2 Helpers     Transfers Chair/bed transfer  Transfers assist  Chair/bed transfer activity did not occur: Safety/medical concerns  Chair/bed transfer assist level: Minimal Assistance - Patient > 75%     Locomotion Ambulation   Ambulation assist   Ambulation activity did not occur: Safety/medical concerns  Walk 10 feet activity   Assist  Walk 10 feet activity did not occur: Safety/medical concerns        Walk 50 feet activity   Assist Walk 50 feet with 2 turns activity did not occur: Safety/medical concerns          Walk 150 feet activity   Assist Walk 150 feet activity did not occur: Safety/medical concerns         Walk 10 feet on uneven surface  activity   Assist Walk 10 feet on uneven surfaces activity did not occur: Safety/medical concerns         Wheelchair     Assist Will patient use wheelchair at discharge?: Yes Type of Wheelchair: Manual Wheelchair activity did not occur: Safety/medical concerns  Wheelchair assist level: Independent Max wheelchair distance: 150'    Wheelchair 50 feet with 2 turns activity    Assist    Wheelchair 50 feet with 2 turns activity did not occur: Safety/medical concerns   Assist Level: Independent   Wheelchair 150 feet activity     Assist  Wheelchair 150 feet activity did not occur: Safety/medical concerns   Assist Level: Independent   Blood pressure 112/68, pulse (!) 56, temperature 98.2 F (36.8 C), resp. rate 18, height 6\' 2"  (1.88 m), weight 89.6 kg, SpO2 100 %.  Medical Problem List and Plan: 1.C7 Quadriplegia ASIA B and functional deficitssecondary to C7-T1 epidural abscess with associated paraplegia  Continue CIR 2. Antithrombotics: -DVT/anticoagulation-bilateral gastrocs:Eliquis 11/28- will try and get NSU to allow Lovenox due to  high risk of DVT in SCI patients.   -antiplatelet therapy: N/A 3. Pain Management:does not like oxycodone due to SE--changed to ultram prn.Encouragedpatient to take muscle relaxers and Tylenol as needed  Controlled with meds on 12/19 4. Mood:LCSW to follow for evaluation and support -antipsychotic agents: N/A 5. Neuropsych: This patientiscapable of making decisions onhisown behalf. 6. Skin/Wound Care:Routine pressure relief measureswith local care to sacrum. [place] RN consult as appropriate.  Pressure relief with every 2 hour turns  Lower last bed 7. Fluids/Electrolytes/Nutrition:Monitor I's/O.   BMP within acceptable range on 12/14, labs ordered for  Monday 8.Acute blood loss anemia:   Hemoglobin 9.1 on 12/14, labs ordered for Monday 9.PAF: Monitor heart rate 3 times daily.   Metoprolol decreased to 12.5 twice daily on 12/16 10. Neurogenic bladder:    Continue I/O caths 11.Neurogenic bowel:   Changed senna to 2 every morning with suppositories in evening after supper for PM program.   KUB personally reviewed on 12/18, showing stool  Adjust bowel meds as necessary  12. Epidural abscess- on IV Rocephin x 8 weeks:  13. Hyperglycemia: Stress Induced hyperglycemia   11/29- A1c 5.6 #14.  Normochromic normocytic anemia we will check stool guaiac given anticoagulants, start iron supplement  12/7- back up to 8.4- doing better/is recovering some   12/14- Hb up to 9.1 15. 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       LOS: 22 days A FACE TO FACE EVALUATION WAS PERFORMED  Juliet Vasbinder Lorie Phenix 11/09/2019, 3:48 PM

## 2019-11-09 NOTE — Plan of Care (Signed)
Discharged standing and gait goals due to decreased safety with attempting this due to ongoing orthostatic BP in upright position and lack of BLE strength and trunk control needed to attempt standing. Upgraded bed mobility and w/c mobility goals due to progress.

## 2019-11-09 NOTE — Progress Notes (Signed)
Patient is self cath. 3rd day. No issues. At 2144 cath for 700 cc, 155-400 cc and 606-400 cc. Rectal vault checked this morning followed by digital stimulation. Patient had some small greenish bowel movement. Tolerated well.

## 2019-11-10 ENCOUNTER — Encounter (HOSPITAL_COMMUNITY): Payer: Medicare Other | Admitting: Occupational Therapy

## 2019-11-10 DIAGNOSIS — D649 Anemia, unspecified: Secondary | ICD-10-CM

## 2019-11-10 MED ORDER — COLLAGENASE 250 UNIT/GM EX OINT
TOPICAL_OINTMENT | Freq: Two times a day (BID) | CUTANEOUS | Status: DC
  Filled 2019-11-10: qty 30

## 2019-11-10 NOTE — Progress Notes (Signed)
PHYSICAL MEDICINE & REHABILITATION PROGRESS NOTE   Subjective/Complaints: Patient seen sitting up in bed this morning.  He states he slept well overnight.  Slept fairly well per sleep chart.  Continues to improve.  ROS: Denies CP, SOB, N/V/D  Objective:   DG Abd 1 View  Result Date: 11/08/2019 CLINICAL DATA:  Frequent stools. Abdominal pain. EXAM: ABDOMEN - 1 VIEW COMPARISON:  10/18/2019 FINDINGS: Nondistended gas-filled loops of small bowel and colon noted. A moderate amount of stool within the colon is present. No dilated bowel loops are present. No suspicious calcifications are present. No acute bony abnormalities are noted. IMPRESSION: 1. Nonspecific bowel gas pattern. No evidence of bowel obstruction. 2. Moderate colonic stool. Electronically Signed   By: Margarette Canada M.D.   On: 11/08/2019 14:31   No results for input(s): WBC, HGB, HCT, PLT in the last 72 hours. No results for input(s): NA, K, CL, CO2, GLUCOSE, BUN, CREATININE, CALCIUM in the last 72 hours.  Intake/Output Summary (Last 24 hours) at 11/10/2019 1213 Last data filed at 11/10/2019 0900 Gross per 24 hour  Intake 720 ml  Output 1800 ml  Net -1080 ml     Physical Exam: Vital Signs Blood pressure 98/68, pulse 64, temperature 98.6 F (37 C), temperature source Oral, resp. rate 18, height 6\' 2"  (1.88 m), weight 89.6 kg, SpO2 99 %.  Constitutional: No distress . Vital signs reviewed. HENT: Normocephalic.  Atraumatic. Eyes: EOMI. No discharge. Cardiovascular: No JVD. Respiratory: Normal effort.  No stridor. GI: Non-distended. Skin: Warm and dry.  Intact. Psych: Normal mood.  Normal behavior. Musc: No edema in extremities.  No tenderness in extremities. Neurological:  Alert Motor: Bilateral upper extremities: 5/5 proximal distal Right lower extremity: 2/5 proximal distally  Left lower extremity: Hip flexion 1/5, 1/5 knee extension 1/5, ankle dorsiflexion 0/5, wiggles toes,  improving  Assessment/Plan: 1. Functional deficits secondary to C7 quadriplegia/ ASIA B with neurogenic bowel and bladder which require 3+ hours per day of interdisciplinary therapy in a comprehensive inpatient rehab setting.  Physiatrist is providing close team supervision and 24 hour management of active medical problems listed below.  Physiatrist and rehab team continue to assess barriers to discharge/monitor patient progress toward functional and medical goals  Care Tool:  Bathing    Body parts bathed by patient: Right arm, Left arm, Chest, Face, Abdomen, Right upper leg, Right lower leg, Left lower leg, Left upper leg, Front perineal area, Buttocks   Body parts bathed by helper: Buttocks Body parts n/a: Front perineal area, Buttocks   Bathing assist Assist Level: Supervision/Verbal cueing     Upper Body Dressing/Undressing Upper body dressing   What is the patient wearing?: Pull over shirt    Upper body assist Assist Level: Contact Guard/Touching assist Assistive Device Comment: Sitting EOB  Lower Body Dressing/Undressing Lower body dressing      What is the patient wearing?: Pants, Underwear/pull up     Lower body assist Assist for lower body dressing: Supervision/Verbal cueing     Toileting Toileting Toileting Activity did not occur (Clothing management and hygiene only): N/A (no void or bm)  Toileting assist Assist for toileting: 2 Helpers     Transfers Chair/bed transfer  Transfers assist  Chair/bed transfer activity did not occur: Safety/medical concerns  Chair/bed transfer assist level: Minimal Assistance - Patient > 75%     Locomotion Ambulation   Ambulation assist   Ambulation activity did not occur: Safety/medical concerns          Walk  10 feet activity   Assist  Walk 10 feet activity did not occur: Safety/medical concerns        Walk 50 feet activity   Assist Walk 50 feet with 2 turns activity did not occur: Safety/medical  concerns         Walk 150 feet activity   Assist Walk 150 feet activity did not occur: Safety/medical concerns         Walk 10 feet on uneven surface  activity   Assist Walk 10 feet on uneven surfaces activity did not occur: Safety/medical concerns         Wheelchair     Assist Will patient use wheelchair at discharge?: Yes Type of Wheelchair: Manual Wheelchair activity did not occur: Safety/medical concerns  Wheelchair assist level: Independent Max wheelchair distance: 150'    Wheelchair 50 feet with 2 turns activity    Assist    Wheelchair 50 feet with 2 turns activity did not occur: Safety/medical concerns   Assist Level: Independent   Wheelchair 150 feet activity     Assist  Wheelchair 150 feet activity did not occur: Safety/medical concerns   Assist Level: Independent   Blood pressure 98/68, pulse 64, temperature 98.6 F (37 C), temperature source Oral, resp. rate 18, height 6\' 2"  (1.88 m), weight 89.6 kg, SpO2 99 %.  Medical Problem List and Plan: 1.C7 Quadriplegia ASIA B and functional deficitssecondary to C7-T1 epidural abscess with associated paraplegia  Continue CIR 2. Antithrombotics: -DVT/anticoagulation-bilateral gastrocs:Eliquis 11/28- will try and get NSU to allow Lovenox due to  high risk of DVT in SCI patients.   -antiplatelet therapy: N/A 3. Pain Management:does not like oxycodone due to SE--changed to ultram prn.Encouragedpatient to take muscle relaxers and Tylenol as needed  Controlled with meds on 12/20 4. Mood:LCSW to follow for evaluation and support -antipsychotic agents: N/A 5. Neuropsych: This patientiscapable of making decisions onhisown behalf. 6. Skin/Wound Care:Routine pressure relief measureswith local care to sacrum. [place] RN consult as appropriate.  Pressure relief with every 2 hour turns  Lower last bed 7. Fluids/Electrolytes/Nutrition:Monitor I's/O.   BMP within acceptable  range on 12/14, labs ordered for tomorrow 8.Acute blood loss anemia:   Hemoglobin 9.1 on 12/14, labs ordered for tomorrow 9.PAF: Monitor heart rate 3 times daily.   Metoprolol decreased to 12.5 twice daily on 12/16 10. Neurogenic bladder:    Continue I/O caths 11.Neurogenic bowel:   Changed senna to 2 every morning with suppositories in evening after supper for PM program.   KUB personally reviewed on 12/18, showing stool  Continue to adjust bowel meds as necessary   Improving 12. Epidural abscess- on IV Rocephin x 8 weeks:  13. Hyperglycemia: Stress Induced hyperglycemia   11/29- A1c 5.6 14.  Normochromic normocytic anemia   Started iron supplement  Hemoglobin 9.1 on 12/14, labs ordered for tomorrow  15. 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   LOS: 23 days A FACE TO FACE EVALUATION WAS PERFORMED  Cy Bresee Lorie Phenix 11/10/2019, 12:13 PM

## 2019-11-10 NOTE — Progress Notes (Signed)
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 The best treatment is Santyl BID, wet to dry dressing followed by sacral foam dressing to prevent further breakdown. Santyl has been ordered and dressing changes to begin at 0500 11/11/2019.  Dorthula Nettles, RN3, CBIS, CRRN, [facility]

## 2019-11-10 NOTE — Progress Notes (Signed)
Bowel program completed following initiation by day shift nurse. Digital stimulation performed twice. Patient had medium amount of stool expressed on first pass, then a small amount on the second. No signs of acute distress. Peri-care performed and patient repositioned for comfort.

## 2019-11-10 NOTE — Progress Notes (Signed)
Occupational Therapy Session Note  Patient Details  Name: Ryan Reed MRN: [id number] Date of Birth: [birth year]  Today's Date: 11/10/2019 OT Individual Time: 1300-1400 OT Individual Time Calculation (min): 60 min    Short Term Goals: Week 4:  OT Short Term Goal 1 (Week 4): STG=LTG due to LOS  Skilled Therapeutic Interventions/Progress Updates:    Pt seen for OT session focusing on functional mobility, sitting balance and ADL re-training. Pt awake sitting up in bed upon arrival, denying pain and agreeable to tx session. Pt reports wife was unable to come in today for scheduled family ed due to remodeling  Going on at house in prep for pt's d/c home. He reports that family ed with wife and daughter went well yesterday.  Pt requesting to bathe this session. TED hose donned total A bed level. He donned pants bed level with increased time and use of bed rails for rolling R/L in order to pull pants up. BP sitting up in bed 129/75 with TED hose donned. Transferred to sitting EOB with use of leg loops and guarding assist. BP 119/62 sitting EOB. Completed UB bathing/dressing seated EOB with min A for dynamic balance overall, supervision with 1 UE support sitting EOB. Pt able to reach down to unfasten shoes with steadying assist and maintain balance while assist provided to don shoes.  Practiced lateral leans EOB to R/L to pull pants down/up in simulation of upright toileting task. Pt required significantly increased time and guarding assist for balance with periodic rest breaks required.  He returned to supine with guarding assist, managing B LEs up onto bed with use of leg loops and increased time.  Per pt request, new brief donned. Therapist assist sacral wound, RN alerted to change in wound. Pt left in side-lying awaiting RN assessment, all needs in reach.   Therapy Documentation Precautions:  Precautions Precautions: Cervical, Back Restrictions Weight Bearing Restrictions:  No   Therapy/Group: Individual Therapy  Adah Stoneberg L 11/10/2019, 6:39 AM

## 2019-11-10 NOTE — Progress Notes (Addendum)
Patient feels like he needs to have a bowel movement this morning at 4am. Patient had small amount of stool that is mushy in consistency. Rectal vault checked, patient had an additional large bowel movement. Peri care performed afterwards.

## 2019-11-11 ENCOUNTER — Ambulatory Visit (HOSPITAL_COMMUNITY): Payer: Medicare Other | Admitting: *Deleted

## 2019-11-11 ENCOUNTER — Inpatient Hospital Stay (HOSPITAL_COMMUNITY): Payer: Medicare Other | Admitting: Occupational Therapy

## 2019-11-11 DIAGNOSIS — L8990 Pressure ulcer of unspecified site, unspecified stage: Secondary | ICD-10-CM | POA: Insufficient documentation

## 2019-11-11 LAB — BASIC METABOLIC PANEL
Anion gap: 10 (ref 5–15)
Anion gap: 8 (ref 5–15)
BUN: 14 mg/dL (ref 8–23)
BUN: 15 mg/dL (ref 8–23)
CO2: 26 mmol/L (ref 22–32)
CO2: 26 mmol/L (ref 22–32)
Calcium: 8.7 mg/dL — ABNORMAL LOW (ref 8.9–10.3)
Calcium: 8.6 mg/dL — ABNORMAL LOW (ref 8.9–10.3)
Chloride: 103 mmol/L (ref 98–111)
Chloride: 103 mmol/L (ref 98–111)
Creatinine, Ser: 0.56 mg/dL — ABNORMAL LOW (ref 0.61–1.24)
Creatinine, Ser: 0.66 mg/dL (ref 0.61–1.24)
GFR calc Af Amer: >60 mL/min (ref >60)
GFR calc Af Amer: >60 mL/min (ref >60)
GFR calc non Af Amer: >60 mL/min (ref >60)
GFR calc non Af Amer: >60 mL/min (ref >60)
Glucose, Bld: 101 mg/dL — ABNORMAL HIGH (ref 70–99)
Glucose, Bld: 116 mg/dL — ABNORMAL HIGH (ref 70–99)
Potassium: 3.9 mmol/L (ref 3.5–5.1)
Potassium: 3.6 mmol/L (ref 3.5–5.1)
Sodium: 139 mmol/L (ref 135–145)
Sodium: 137 mmol/L (ref 135–145)

## 2019-11-11 LAB — CBC WITH DIFFERENTIAL/PLATELET
Abs Immature Granulocytes: 0.01 10*3/uL (ref 0.00–0.07)
Abs Immature Granulocytes: 0.02 10*3/uL (ref 0.00–0.07)
Basophils Absolute: 0.1 10*3/uL (ref 0.0–0.1)
Basophils Absolute: 0.1 10*3/uL (ref 0.0–0.1)
Basophils Relative: 1 %
Basophils Relative: 1 %
Eosinophils Absolute: 0.3 10*3/uL (ref 0.0–0.5)
Eosinophils Absolute: 0.3 10*3/uL (ref 0.0–0.5)
Eosinophils Relative: 6 %
Eosinophils Relative: 5 %
HCT: 31.7 % — ABNORMAL LOW (ref 39.0–52.0)
HCT: 31.3 % — ABNORMAL LOW (ref 39.0–52.0)
Hemoglobin: 9.9 g/dL — ABNORMAL LOW (ref 13.0–17.0)
Hemoglobin: 9.9 g/dL — ABNORMAL LOW (ref 13.0–17.0)
Immature Granulocytes: 0 %
Immature Granulocytes: 0 %
Lymphocytes Relative: 19 %
Lymphocytes Relative: 23 %
Lymphs Abs: 0.9 10*3/uL (ref 0.7–4.0)
Lymphs Abs: 1.3 10*3/uL (ref 0.7–4.0)
MCH: 28.3 pg (ref 26.0–34.0)
MCH: 28.2 pg (ref 26.0–34.0)
MCHC: 31.2 g/dL (ref 30.0–36.0)
MCHC: 31.6 g/dL (ref 30.0–36.0)
MCV: 90.6 fL (ref 80.0–100.0)
MCV: 89.2 fL (ref 80.0–100.0)
Monocytes Absolute: 0.3 10*3/uL (ref 0.1–1.0)
Monocytes Absolute: 0.5 10*3/uL (ref 0.1–1.0)
Monocytes Relative: 7 %
Monocytes Relative: 8 %
Neutro Abs: 3.2 10*3/uL (ref 1.7–7.7)
Neutro Abs: 3.5 10*3/uL (ref 1.7–7.7)
Neutrophils Relative %: 67 %
Neutrophils Relative %: 63 %
Platelets: 321 10*3/uL (ref 150–400)
Platelets: 314 10*3/uL (ref 150–400)
RBC: 3.5 MIL/uL — ABNORMAL LOW (ref 4.22–5.81)
RBC: 3.51 MIL/uL — ABNORMAL LOW (ref 4.22–5.81)
RDW: 13.1 % (ref 11.5–15.5)
RDW: 13.1 % (ref 11.5–15.5)
WBC: 4.7 10*3/uL (ref 4.0–10.5)
WBC: 5.7 10*3/uL (ref 4.0–10.5)
nRBC: 0 % (ref 0.0–0.2)
nRBC: 0 % (ref 0.0–0.2)

## 2019-11-11 LAB — C-REACTIVE PROTEIN: CRP: 0.6 mg/dL (ref <1.0)

## 2019-11-11 LAB — SEDIMENTATION RATE: Sed Rate: 55 mm/hr — ABNORMAL HIGH (ref 0–16)

## 2019-11-11 NOTE — Progress Notes (Signed)
Occupational Therapy Session Note  Patient Details  Name: Ryan Reed MRN: [id number] Date of Birth: 17-Jun-1951  Today's Date: 11/11/2019 OT Individual Time: 4098-1191  &  0930-1056 OT Individual Time Calculation (min): 35 min & 86 min   Short Term Goals: Week 3:  OT Short Term Goal 1 (Week 3): Pt will complete clothing management in prep for self-cathing independently OT Short Term Goal 1 - Progress (Week 3): Met OT Short Term Goal 2 (Week 3): Pt will consistently transfer w/c<>EOB with +1 assist in order to decrease caregiver burden OT Short Term Goal 2 - Progress (Week 3): Met OT Short Term Goal 3 (Week 3): Will initiate hands on training with pt's caregivers in prep for d/c OT Short Term Goal 4 (Week 3): Pt will don pants from bed level with set-up OT Short Term Goal 4 - Progress (Week 3): Met  Skilled Therapeutic Interventions/Progress Updates:    session 1:  Patient in bed, alert - reviewed plan for therapy sessions today - assisted with TEDS and ace wraps - he donned shorts and right shoe with set up, max A for left shoe.  Completed bed mobility with CS.  SB transfer bed to w/c with CGA.  He completed oral care and room set up mod I.  He remained in the w/c with call bell in reach.    Session 2:  Patient states that he is ready to take a shower - min A for w/c set up in bathroom.  CGA/min a for SB transfer w/c to padded transfer bench/commode.  He completed shower (teds on due to ongoing BP concerns) with min A to reach bilateral lower legs - otherwise CS level.  Transfer shower bench to w/c with min A and increased time due to wet surfaces and mild fatigue.  He completed transfer w/c to bed with CGA where he completed LB dressing tasks.  teds dependent.  Min A and moderate cues to donn incontinence brief.  Set up for shorts, he is able to donn slipper socks with set up.  OH shirt with set up.  Reviewed positioning and plan for home with good insight noted.  Completed unsupported  sitting/trunk and posture activities with focus on pelvic mobility and lower back control/abdominal strength - good results with significant effort.  Completed supine core exercises with good effort and ability to follow back precautions.  He remained in bed at close of session, bed alarm set and call bell/tray table in reach.    Therapy Documentation Precautions:  Precautions Precautions: Cervical, Back Restrictions Weight Bearing Restrictions: No General:   Vital Signs: Therapy Vitals Temp: 98.3 F (36.8 C) Pulse Rate: 67 Resp: 16 BP: 116/65 Patient Position (if appropriate): Lying Oxygen Therapy SpO2: 99 % O2 Device: Room Air Pain: Pain Assessment Pain Scale: 0-10 Pain Score: 0-No pain Other Treatments:     Therapy/Group: Individual Therapy  Carlos Levering 11/11/2019, 3:58 PM

## 2019-11-11 NOTE — Progress Notes (Signed)
[place] PHYSICAL MEDICINE & REHABILITATION PROGRESS NOTE   Subjective/Complaints:   Pt reports bowel program went better over weekend- had episode of bowel incontinence overnight even though had good results with bowel program last night- also had GREAT results with rectal clear this AM  Trying to limit intake to ~ 2L- is hard but getting better. Usually has cath volumes now ~400cc- with an occ high one at night.   ROS: Denies CP, SOB, N/V/D  Objective:   No results found. Recent Labs    11/11/19 0450  WBC 4.7  HGB 9.9*  HCT 31.7*  PLT 321   Recent Labs    11/11/19 0450  NA 139  K 3.9  CL 103  CO2 26  GLUCOSE 101*  BUN 14  CREATININE 0.56*  CALCIUM 8.7*    Intake/Output Summary (Last 24 hours) at 11/11/2019 1338 Last data filed at 11/11/2019 1100 Gross per 24 hour  Intake 520 ml  Output 1900 ml  Net -1380 ml     Physical Exam: Vital Signs Blood pressure 116/65, pulse 67, temperature 98.3 F (36.8 C), resp. rate 16, height 6\' 2"  (1.88 m), weight 89.6 kg, SpO2 99 %.  Constitutional: No distress . Vital signs and labs and nursing notes reviewed. Sitting up in manual w/c, in room, unable to assess sacral ulcer due to position, appropriate, NAD HENT: Normocephalic.  Atraumatic. Eyes: EOMI. No discharge. Cardiovascular: No JVD. Respiratory: Normal effort.  No stridor. GI: Non-distended. Skin: Warm and dry.  Intact. Psych: Normal mood.  Normal behavior. Musc: No edema in extremities.  No tenderness in extremities. Neurological:  Alert Motor: Bilateral upper extremities: 5/5 proximal distal Right lower extremity: 2/5 proximal distally  Left lower extremity: Hip flexion 1/5, 1/5 knee extension 1/5, ankle dorsiflexion 0/5, wiggles toes, improving  Assessment/Plan: 1. Functional deficits secondary to C7 quadriplegia/ ASIA B with neurogenic bowel and bladder which require 3+ hours per day of interdisciplinary therapy in a comprehensive inpatient rehab  setting.  Physiatrist is providing close team supervision and 24 hour management of active medical problems listed below.  Physiatrist and rehab team continue to assess barriers to discharge/monitor patient progress toward functional and medical goals  Care Tool:  Bathing    Body parts bathed by patient: Right arm, Left arm, Chest, Face, Abdomen, Right upper leg, Right lower leg, Left lower leg, Left upper leg, Front perineal area, Buttocks   Body parts bathed by helper: Buttocks Body parts n/a: Front perineal area, Buttocks   Bathing assist Assist Level: Supervision/Verbal cueing     Upper Body Dressing/Undressing Upper body dressing   What is the patient wearing?: Pull over shirt    Upper body assist Assist Level: Contact Guard/Touching assist Assistive Device Comment: Sitting EOB  Lower Body Dressing/Undressing Lower body dressing      What is the patient wearing?: Pants, Underwear/pull up     Lower body assist Assist for lower body dressing: Supervision/Verbal cueing     Toileting Toileting Toileting Activity did not occur (Clothing management and hygiene only): N/A (no void or bm)  Toileting assist Assist for toileting: 2 Helpers     Transfers Chair/bed transfer  Transfers assist  Chair/bed transfer activity did not occur: Safety/medical concerns  Chair/bed transfer assist level: Minimal Assistance - Patient > 75%     Locomotion Ambulation   Ambulation assist   Ambulation activity did not occur: Safety/medical concerns          Walk 10 feet activity   Assist  Walk 10  feet activity did not occur: Safety/medical concerns        Walk 50 feet activity   Assist Walk 50 feet with 2 turns activity did not occur: Safety/medical concerns         Walk 150 feet activity   Assist Walk 150 feet activity did not occur: Safety/medical concerns         Walk 10 feet on uneven surface  activity   Assist Walk 10 feet on uneven surfaces  activity did not occur: Safety/medical concerns         Wheelchair     Assist Will patient use wheelchair at discharge?: Yes Type of Wheelchair: Manual Wheelchair activity did not occur: Safety/medical concerns  Wheelchair assist level: Independent Max wheelchair distance: 150'    Wheelchair 50 feet with 2 turns activity    Assist    Wheelchair 50 feet with 2 turns activity did not occur: Safety/medical concerns   Assist Level: Independent   Wheelchair 150 feet activity     Assist  Wheelchair 150 feet activity did not occur: Safety/medical concerns   Assist Level: Independent   Blood pressure 116/65, pulse 67, temperature 98.3 F (36.8 C), resp. rate 16, height 6\' 2"  (1.88 m), weight 89.6 kg, SpO2 99 %.  Medical Problem List and Plan: 1.C7 Quadriplegia ASIA B and functional deficitssecondary to C7-T1 epidural abscess with associated paraplegia  Continue CIR 2. Antithrombotics: -DVT/anticoagulation-bilateral gastrocs:Eliquis 11/28- will try and get NSU to allow Lovenox due to  high risk of DVT in SCI patients.   -antiplatelet therapy: N/A 3. Pain Management:does not like oxycodone due to SE--changed to ultram prn.Encouragedpatient to take muscle relaxers and Tylenol as needed  Controlled with meds on 12/21 4. Mood:LCSW to follow for evaluation and support -antipsychotic agents: N/A 5. Neuropsych: This patientiscapable of making decisions onhisown behalf. 6. Skin/Wound Care:Routine pressure relief measureswith local care to sacrum. [place] RN consult as appropriate.  Pressure relief with every 2 hour turns  Lower last bed 7. Fluids/Electrolytes/Nutrition:Monitor I's/O.   BMP within acceptable range on 12/14, labs ordered for tomorrow 8.Acute blood loss anemia:   Hemoglobin 9.1 on 12/14, labs ordered for tomorrow 9.PAF: Monitor heart rate 3 times daily.   Metoprolol decreased to 12.5 twice daily on 12/16 10. Neurogenic  bladder:    Continue I/O caths  12/21- doing well with own caths- volumes much improved 11.Neurogenic bowel:   Changed senna to 2 every morning with suppositories in evening after supper for PM program.   KUB personally reviewed on 12/18, showing stool  Continue to adjust bowel meds as necessary   Improving 12. Epidural abscess- on IV Rocephin x 8 weeks:  13. Hyperglycemia: Stress Induced hyperglycemia   11/29- A1c 5.6 14.  Normochromic normocytic anemia   Started iron supplement  Hemoglobin 9.1 on 12/14, labs ordered for tomorrow  15. 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  31. Dispo  12/21- d/c Wed after therapy- wife needs to learn how to manage IV ABX/PICC also before d/c.  LOS: 24 days A FACE TO FACE EVALUATION WAS PERFORMED  Ryan Reed 11/11/2019, 1:38 PM

## 2019-11-11 NOTE — Progress Notes (Signed)
Bowel program completed following initiation from day shift nurse. Received report that digital stimulation and suppository had been performed. Digital stimulation performed twice following day shift initiation. First pass expelled medium bowel movement. Second pass expelled small bowel movement. Peri-care performed and patient repositioned for comfort.

## 2019-11-11 NOTE — Progress Notes (Signed)
Patient last cathed at 03:11. Scanned per q4 order at 06:40 for 217ml, but patient requested to wait to be cathed until after breakfast, but before his therapy at 08:00. Day shift nurse informed during report of patient's request.

## 2019-11-11 NOTE — Progress Notes (Signed)
Recreational Therapy Session Note  Patient Details  Name: [name] MRN: [id number] Date of Birth: 19-Mar-1951 Today's Date: 11/11/2019 Time:  1420-1530 Pain: no c/o Skilled Therapeutic Interventions/Progress Updates: Session focused on family education/answering questions with pt, wife & daughter including slide board transfers, general safety concerns, energy conservation and accessing w/c accessible van during co-treat with PT.  Specific education provided on how to safely secure the w/c to the [place] floor using built in restraint system.  Demonstrated and had them return demonstration of where to secure the tie downs on the frame of the w/c.  Suggested they follow up from [employer] owner or manual on how to use seatbelt system w/c level.  Education was provided on where to thread the seat belt next to patient for best support.  All stated understanding.   Therapy/Group: Co-Treatment Aunya Lemler 11/11/2019, 4:14 PM

## 2019-11-11 NOTE — Progress Notes (Signed)
Physical Therapy Session Note  Patient Details  Name: Ryan Reed MRN: [id number] Date of Birth: [birth year]  Today's Date: 11/11/2019 PT Individual Time: 1415-1545 PT Individual Time Calculation (min): 90 min   Short Term Goals: Week 4:  PT Short Term Goal 1 (Week 4): =LTG due to ELOS  Skilled Therapeutic Interventions/Progress Updates:    Pt received seated in bed, agreeable to PT session. No complaints of pain. Pt's wife and daughter present for hands-on family education session. Supine BP 99/61 with use of BLE thigh high TEDs. Reviewed BP parameters with patient's family. Applied BLE ACE wrap due to low BP to LLE while pt's daughter applied to RLE with min cueing for technique. Bed mobility min A due to pt not wearing leg loops for time conservation. Slide board transfer bed to w/c with min A with pt's daughter performing transfer, min cueing from patient for safe setup. Pt is mod I for w/c mobility up to 500 ft with use of BUE. Pt's wife present with handicap accessible w/c Lucianne Lei that they will be using temporarily upon pt discharge. Pt's wife able to operate van to lift and lower ramp to enter the [place]. Reviewed safe placement of w/c anchors in [place] and where to place on w/c as well as reviewed use of seat belt while pt seated in w/c. Pt requesting to return to bed at end of session. Slide board transfer back to bed with min A with pt's daughter performing transfer. Sit to supine mod A for BLE management due to pt not wearing leg loops. Reviewed pressure relief schedule while in w/c but also in bed due to ongoing pressure wound on pt's backside. Reviewed what to do if pt has a fall at home with regards to calling the local fire department vs attempting to lift pt back up by themselves. Reviewed autonomic dysreflexia and again provided handout to patient's family with regards to causes, symptoms, how to address, etc. Pt's family also with several questions for CSW, she is present at end of  session to answer questions. Pt left semi-reclined in bed with needs in reach at end of session. Cotreatment session with RT.  Therapy Documentation Precautions:  Precautions Precautions: Cervical, Back Restrictions Weight Bearing Restrictions: No   Therapy/Group: Individual Therapy   Excell Seltzer, PT, DPT  11/11/2019, 4:02 PM

## 2019-11-12 ENCOUNTER — Inpatient Hospital Stay (HOSPITAL_COMMUNITY): Payer: Medicare Other | Admitting: Occupational Therapy

## 2019-11-12 ENCOUNTER — Encounter (HOSPITAL_COMMUNITY): Payer: Medicare Other | Admitting: Psychology

## 2019-11-12 ENCOUNTER — Inpatient Hospital Stay (HOSPITAL_COMMUNITY): Payer: Medicare Other | Admitting: Physical Therapy

## 2019-11-12 ENCOUNTER — Inpatient Hospital Stay (HOSPITAL_COMMUNITY): Payer: Medicare Other

## 2019-11-12 LAB — URINALYSIS, ROUTINE W REFLEX MICROSCOPIC
Bilirubin Urine: NEGATIVE
Glucose, UA: NEGATIVE mg/dL
Hgb urine dipstick: NEGATIVE
Ketones, ur: NEGATIVE mg/dL
Leukocytes,Ua: NEGATIVE
Nitrite: NEGATIVE
Protein, ur: NEGATIVE mg/dL
Specific Gravity, Urine: 1.018 (ref 1.005–1.030)
pH: 6 (ref 5.0–8.0)

## 2019-11-12 MED ORDER — POLYETHYLENE GLYCOL 3350 17 G PO PACK: 17.0000 g | PACK | Freq: Every day | ORAL | 0 refills | Status: DC | PRN

## 2019-11-12 NOTE — Patient Care Conference (Signed)
Inpatient RehabilitationTeam Conference and Plan of Care Update Date: 11/12/2019   Time: 11:00 AM   Patient Name: Ryan Reed      Medical Record Number: [id number]  Date of Birth: January 23, 1951 Sex: Male         Room/Bed: 4W14C/4W14C-01 Payor Info: Payor: [place] / Plan: [facility] MEDICARE / Product Type: *No Product type* /    Admit Date/Time:  10/18/2019  5:03 PM  Primary Diagnosis:  Quadriplegia [facility])  Patient Active Problem List   Diagnosis Date Noted  . Pressure injury of skin 11/11/2019  . Anemia   . Constipation   . Neurogenic bladder   . Neurogenic bowel   . Quadriplegia ([place]) 10/19/2019  . 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 Epidural abscess 10/14/2019    Expected Discharge Date: Expected Discharge Date: 11/13/19  Team Members Present: Physician leading conference: Dr. Courtney Heys Social Worker Present: Lennart Pall, LCSW Nurse Present: Ellison Carwin, LPN Case Manager: Karene Fry, RN PT Present: Excell Seltzer, PT OT Present: Amy Rounds, OT SLP Present: Jettie Booze, CF-SLP PPS Coordinator present : Gunnar Fusi, SLP     Current Status/Progress Goal Weekly Team Focus  Bowel/Bladder   Pt incont of bowel; bowel program w/2x dig stim and suppository. Pt still has some episodes of bowel incontinence through out night. Pt self-caths I/O. PT scanned q4hrs  bowel program after evening will continue. Pt will continue to self-cath  continue to perform bowel program. assess patient's clean technique during self-cath   Swallow/Nutrition/ Hydration             ADL's   CGA sliding board transfers, min A LB dressing bed level, set-up UB/LB bathing bed level, mod I w/c level grooming, self-cath with set-up  Supervision-min A overall  ADL re-training, SCI education, dynamic sitting balance, functional transfers, family education and d/c planning   Mobility   Supervision bed mobility with use of leg loops, min A  SB transfers, mod I at w/c level  min A overall, mod I w/c mobility  family education, d/c planning   Communication             Safety/Cognition/ Behavioral Observations            Pain   pt complains of 4/10 back pain. PRN tramadol given, effective.  pain > or = 4  assess pain q shift/ prn, treat prn   Skin   unstageable pressure injury coccyx, treated with wet-to-dry dressing and santyl ointment BID. deep tissue injury R buttocks, foam in place. Pt attempting self-turning Q2h, Q4h turn with bladder scan  No further skin breakdown and free of infection  turn patient q2-4 hrs depending on pt cooperation. assess skin qshift/prn    Rehab Goals Patient on target to meet rehab goals: Yes *See Care Plan and progress notes for long and short-term goals.     Barriers to Discharge  Current Status/Progress Possible Resolutions Date Resolved   Nursing                  PT                    OT                  SLP                SW                Discharge Planning/Teaching  Needs:  Pt to d/c home with wife who can provide 24/7 assistance.  Local son and daughter-in-law also to assist.  Teaching completed with wife and daughter   Team Discussion: Wound backside masserated, working on bowel program, wife needs to see bowel program.  RN taking tramadol for sleep, self cath doing well.  OT CGA slide board, set up B/D, fam ed ongoing.  PT bed CGA/min A, mod I w/c level, fam ed done.   Revisions to Treatment Plan: N/A     Medical Summary Current Status: wound macerated- stage II; self cathing; CGA SB transfers; S/U dressing; family traiing being done Weekly Focus/Goal: d/c tomorrow  Barriers to Discharge: Wound care;Weight bearing restrictions;Weight;Neurogenic Bowel & Bladder;Incontinence;Decreased family/caregiver support;Home enviroment access/layout  Barriers to Discharge Comments: quadriplegia Possible Resolutions to Barriers: CGA SB transfers- mod I in w/c- family training finishing  today- d/c   Continued Need for Acute Rehabilitation Level of Care: The patient requires daily medical management by a physician with specialized training in physical medicine and rehabilitation for the following reasons: Direction of a multidisciplinary physical rehabilitation program to maximize functional independence : Yes Medical management of patient stability for increased activity during participation in an intensive rehabilitation regime.: Yes Analysis of laboratory values and/or radiology reports with any subsequent need for medication adjustment and/or medical intervention. : Yes   I attest that I was present, lead the team conference, and concur with the assessment and plan of the team.   Retta Diones 11/12/2019, 8:41 PM  Team conference was held via web/ teleconference due to [place] - 19

## 2019-11-12 NOTE — Progress Notes (Signed)
Recreational Therapy Session Note  Patient Details  Name: Ryan Reed MRN: [id number] Date of Birth: [birth year] Today's Date: 11/12/2019 Time:  [id number] Pain: no c/o Skilled Therapeutic Interventions/Progress Updates: Session to focus on activity tolerance and dynamic sitting balance during co-treat with PT.  Pt performed slide board transfers x2 with min assist.  Once seated on therapy mat and participating in balance activity with PT, pt stated feeling lightheaded and requested to lean back for supported sitting.  Discussed cath schedule and pt reported that he was scanned prior to this session and found to have very little urine and no need to cath.  Pt also reported successful bowel program today.  Pt with increasing c/o dizziness and BP assessed 137/108, HR 104.  Pt requesting to lie down, LRT notified RN and MD  (Continued to monitor BP- see Flow sheets for readings).  Leg loops removed but nothing appeared tight and pt not reporting discomfort.  Discussed possibility of autonomic dysreflexia. ( BP now down to 97/70,  HR 67 without any intervention and pt stated he felt weird.  Returned to seated position EOB and performed slide board transfer back to the w/c and then to bed once back in the room with mod assist +2 for safety.  Once in bed, ACE wraps and Ted hose removed, RN at bedside to assist pt with cath.  Pt's BP 107/70, HR 63.  Pt stated feeling better once back in the bed, but felt "clammy."  Cold wash cloth provided.  Continued discussion about autonomic dysreflexia at bedside, encouraged note taking/journaling and reviewed what to do when this happens at home.  Pt stated understanding and apologetic that he couldn't do more in therapy session.  Therapy/Group: Co-Treatment  Hydia Copelin 11/12/2019, 2:42 PM

## 2019-11-12 NOTE — Progress Notes (Addendum)
[facility] PHYSICAL MEDICINE & REHABILITATION PROGRESS NOTE   Subjective/Complaints:   Pt reports mainly taking tramadol at night "to sleep" not as much for pain- recommended at home taking Melatonin and has "something here for sleep"- trazodone.  Good BM with bowel program last night.  Laying on side to get off bottom.  ROS: Denies CP, SOB, N/V/D  Objective:   No results found. Recent Labs    11/11/19 0450 11/11/19 1859  WBC 4.7 5.7  HGB 9.9* 9.9*  HCT 31.7* 31.3*  PLT 321 314   Recent Labs    11/11/19 0450 11/11/19 1859  NA 139 137  K 3.9 3.6  CL 103 103  CO2 26 26  GLUCOSE 101* 116*  BUN 14 15  CREATININE 0.56* 0.66  CALCIUM 8.7* 8.6*    Intake/Output Summary (Last 24 hours) at 11/12/2019 0942 Last data filed at 11/12/2019 [id number] Gross per 24 hour  Intake 220 ml  Output 2750 ml  Net -2530 ml     Physical Exam: Vital Signs Blood pressure 95/60, pulse (!) 56, temperature 98.2 F (36.8 C), temperature source Oral, resp. rate 16, height 6\' 2"  (1.88 m), weight 89.6 kg, SpO2 100 %.  Constitutional: No distress . Vital signs and labs and nursing notes reviewed. Laying on side, L side in bed to get off backside, NAD HENT: Normocephalic.  Atraumatic. Eyes: EOMI. No discharge. Cardiovascular: No JVD. Respiratory: Normal effort.  No stridor. GI: Non-distended. Skin: Warm and dry.  Buttocks- Stage II- macerated/white edges- 2 small spots seen- but moist/wet appearing- otherwise looks superficial/good. Marland Kitchen Psych: Normal mood.  Normal behavior. Musc: No edema in extremities.  No tenderness in extremities. Neurological:  Alert Motor: Bilateral upper extremities: 5/5 proximal distal Right lower extremity: 2/5 proximal distally  Left lower extremity: Hip flexion 1/5, 1/5 knee extension 1/5, ankle dorsiflexion 0/5, wiggles toes, improving  Assessment/Plan: 1. Functional deficits secondary to C7 quadriplegia/ ASIA B with neurogenic bowel and bladder which require 3+  hours per day of interdisciplinary therapy in a comprehensive inpatient rehab setting.  Physiatrist is providing close team supervision and 24 hour management of active medical problems listed below.  Physiatrist and rehab team continue to assess barriers to discharge/monitor patient progress toward functional and medical goals  Care Tool:  Bathing    Body parts bathed by patient: Right arm, Left arm, Chest, Face, Abdomen, Right upper leg, Right lower leg, Left lower leg, Left upper leg, Front perineal area, Buttocks   Body parts bathed by helper: Right lower leg, Left lower leg Body parts n/a: Front perineal area, Buttocks   Bathing assist Assist Level: Minimal Assistance - Patient > 75%(shower level seated on padded transfer bench/commode)     Upper Body Dressing/Undressing Upper body dressing   What is the patient wearing?: Pull over shirt    Upper body assist Assist Level: Set up assist Assistive Device Comment: Sitting EOB  Lower Body Dressing/Undressing Lower body dressing      What is the patient wearing?: Pants, Incontinence brief     Lower body assist Assist for lower body dressing: Minimal Assistance - Patient > 75%     Toileting Toileting Toileting Activity did not occur (Clothing management and hygiene only): N/A (no void or bm)  Toileting assist Assist for toileting: 2 Helpers     Transfers Chair/bed transfer  Transfers assist  Chair/bed transfer activity did not occur: Safety/medical concerns  Chair/bed transfer assist level: Minimal Assistance - Patient > 75%     Locomotion Ambulation  Ambulation assist   Ambulation activity did not occur: Safety/medical concerns          Walk 10 feet activity   Assist  Walk 10 feet activity did not occur: Safety/medical concerns        Walk 50 feet activity   Assist Walk 50 feet with 2 turns activity did not occur: Safety/medical concerns         Walk 150 feet activity   Assist Walk  150 feet activity did not occur: Safety/medical concerns         Walk 10 feet on uneven surface  activity   Assist Walk 10 feet on uneven surfaces activity did not occur: Safety/medical concerns         Wheelchair     Assist Will patient use wheelchair at discharge?: Yes Type of Wheelchair: Manual Wheelchair activity did not occur: Safety/medical concerns  Wheelchair assist level: Independent Max wheelchair distance: 150'    Wheelchair 50 feet with 2 turns activity    Assist    Wheelchair 50 feet with 2 turns activity did not occur: Safety/medical concerns   Assist Level: Independent   Wheelchair 150 feet activity     Assist  Wheelchair 150 feet activity did not occur: Safety/medical concerns   Assist Level: Independent   Blood pressure 95/60, pulse (!) 56, temperature 98.2 F (36.8 C), temperature source Oral, resp. rate 16, height 6\' 2"  (1.88 m), weight 89.6 kg, SpO2 100 %.  Medical Problem List and Plan: 1.C7 Quadriplegia ASIA B and functional deficitssecondary to C7-T1 epidural abscess with associated paraplegia  Continue CIR 2. Antithrombotics: -DVT/anticoagulation-bilateral gastrocs:Eliquis 11/28- will try and get NSU to allow Lovenox due to  high risk of DVT in SCI patients.   -antiplatelet therapy: N/A 3. Pain Management:does not like oxycodone due to SE--changed to ultram prn.Encouragedpatient to take muscle relaxers and Tylenol as needed  Controlled with meds on 12/21 4. Mood:LCSW to follow for evaluation and support -antipsychotic agents: N/A 5. Neuropsych: This patientiscapable of making decisions onhisown behalf. 6. Skin/Wound Care:Routine pressure relief measureswith local care to sacrum. [place] RN consult as appropriate.  Pressure relief with every 2 hour turns  Lower last bed  12/22- will talk with nursing about dressing to not macerate wound on bottom/sacrum 7. Fluids/Electrolytes/Nutrition:Monitor I's/O.    BMP within acceptable range on 12/14, labs ordered for tomorrow 8.Acute blood loss anemia:   Hemoglobin 9.1 on 12/14, labs ordered for tomorrow 9.PAF: Monitor heart rate 3 times daily.   Metoprolol decreased to 12.5 twice daily on 12/16 10. Neurogenic bladder:    Continue I/O caths  12/21- doing well with own caths- volumes much improved 11.Neurogenic bowel:   Changed senna to 2 every morning with suppositories in evening after supper for PM program.   KUB personally reviewed on 12/18, showing stool  Continue to adjust bowel meds as necessary   Improving 12. Epidural abscess- on IV Rocephin x 8 weeks:  13. Hyperglycemia: Stress Induced hyperglycemia   11/29- A1c 5.6 14.  Normochromic normocytic anemia   Started iron supplement  Hemoglobin 9.1 on 12/14, labs ordered for tomorrow  15. 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  [place]  12/21- d/c Wed after therapy- wife needs to learn how to manage IV ABX/PICC also before d/c.  12/22- suggested Melatonin at home for sleep 3-6 mg instead of taking tramadol. Wife also needs to see bowel program.   Patient is a C6/7 Quadriplegic  ASIA B SCI patient who absolutely NEEDS an ultra light weight wheelchair- he doesn't have full strength in his arms and NONE in his legs, so therefore, cannot push a regular manual w/c, only an ultra light weight manual w/c with his reduced strength- I completely concur with PT evaluation with no modifications and pt needs this ultra light weight manual w/c: to be able to transfer, do pressure relief, get in better position to feed and groom/bathe self at home; is 90 kg normally. Any questions, can please call Dr Dagoberto Ligas at [phone].   LOS: 25 days A FACE TO FACE EVALUATION WAS PERFORMED  Colson Barco 11/12/2019, 9:42 AM

## 2019-11-12 NOTE — Progress Notes (Signed)
Physical Therapy Session Note  Patient Details  Name: Ryan Reed MRN: [id number] Date of Birth: [birth year]  Today's Date: 11/12/2019 PT Individual Time: 1100-1155 PT Individual Time Calculation (min): 55 min   Short Term Goals: Week 4:  PT Short Term Goal 1 (Week 4): =LTG due to ELOS  Skilled Therapeutic Interventions/Progress Updates:    Session with focus on bed mobility, transfers, and NMR for balance re-training. Pt performed supine to sit with leg loops and hospital bed features with supervision and maintains balance with RUE support on rail with supervision while PT donned shoes. Pt using leg loops to help guide feet into the shoes. Performed min assist slideboard from w/c to bed and then bed to mat table in gym with pt performing improved lifting technique and intermittent assist needed to remind to reposition foot placement during transfer. On mat table focused on NMR for trunk control and balance retraining with and without UE support with pt able to maintain with close supervision to min assist with minimally challenging tasks. Pt then starting to report lightheadedness and request to lean back for support. Discussed last cath schedule and pt reports he had just been scanned before this session and was not ready to be cathed. Pt also reports bowel program was successful. Pt with increasing dizziness and BP assessed 137/119mmHg (HR = 104 bpm). Pt request to lay down due to dizziness and Rec therapist notified RN and MD, BP assessed again (see flowsheets) and removed leg loops but did not notice anything tight or pt reporting discomfort. Discussed possibility autonomic dysreflexia and education provided throughout. BP then resolved to 97/70 mmHg (67 bpm) without intervention except being in supine, pt with increasing "weird feeling". Returned to seated EOB with mod assist for BLE management due to removal of leg loops and performed +2 transfer for safety back ot w/c and then back to bed  for RN to perform catherization per MD recommendation. Tranfsfer back to bed with +2 for safety mod assist overall and returned to supine with mod assist. Removed ACE wraps and Tedhose and RN to perform catheterization with patient assisting. BP = 107/70 mmHg and HR = 63 after settled with pt reporting improvement in dizziness but still reporting clamminess (did resolve mostly by end of session). Education and discussion about times that patient has experienced autonomic dysreflexia and keeping a journal and what to do if happens at home. Performed suprevision rolling with bed rails for support for redonning of new brief and pulling up pants. All needs in reach and pt not in distress.   Therapy Documentation Precautions:  Precautions Precautions: Cervical, Back Restrictions Weight Bearing Restrictions: No  Pain:  During session, pt presents with c/o dizziness and clammy feeling, does not report pain.    Therapy/Group: Individual Therapy and Co-Treatment with TR  Canary Brim Ivory Broad, PT, DPT, CBIS  11/12/2019, 12:10 PM

## 2019-11-12 NOTE — Consult Note (Addendum)
Neuropsychological Consultation   Patient:   Ryan Reed   DOB:   13-Oct-1951  MR Number:  [id number]  Location:  [facility] [place] [id number] [place] [address] Dept: [id number]: [phone]           Date of Service:   11/12/2019  Start Time:   1 PM End Time:   2 PM  Provider/Observer:  Ilean Skill, Psy.D.       Clinical Neuropsychologist       Billing Code/Service: [id number], [phone]  Chief Complaint:    Ryan Reed is a 68 year old male who has been in relatively good health with the exception of possible undiagnosed obstructive sleep apnea.  The patient developed severe back and chest pain.  Initially seen in urgent care 10/13/2019 with recommendations to go to the ED but did not 3. he developed acute onset of paraplegia with sensory loss and was admitted on 10/14/2019 for work-up.  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  Patient taken the OR emergently for decompressive laminectomy by Dr. Ronnald Ramp.  Patient is recommended for 8 weeks of IV antibiotic therapy.  Patient with paraplegia and sensory deficits from the chest down.  Patient reports now that he has some sensation in his legs but nearly no motor movement from his chest down bilaterally.  11/04/2019: Today's visit was a follow-up visit with this patient who I initially saw a couple weeks prior.  The patient reports that his mood is still staying fairly positive although he is quite frustrated with his overall loss of function.  The patient reports that he is somewhat concerned about once he is discharged of being a "burden to his family."  However, the patient reports that he is beginning to get some movement in his right leg which he is hoping will help with transfers so he can be  more independent.  11/12/2019:  Patient do go home tomorrow.  Apprehensive and worried about extra burden on wife, but sister is coming up from [facility] for few weeks to help. Patient with progressive but slow improvement in motor function. Patient reports that he does get emotional thinking about the impact his deficits are having on his family but feels very good about his recovery and denies any significant depressive events other than just thinking about these impacts. The patient has friends who've built a ramp for him and he is having his bathroom modified.  Reason for Service:  Patient was referred for neuropsychological consultation due to coping and adjustment issues due to loss of function/paraplegia.  Below is the HPI for the current admission.  Ryan Reed is a 68 year old male in relatively good health except for possibly undiagnosed OSA, normocytic anemia who was developed severe back and chest pain--seen at urgent care on 10/13/19 with recommendations to go to ED but declined. He developed acute onset of paraplegia with sensory loss and was admitted on 10/14/19 for work up. 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 He was taken to the OR emergently for C7-T7 posterior decompressive laminectomy with medial facetectomy and foraminotomy for evacuation of epidural abscess by Dr. Ronnald Ramp. Postop had acute blood loss anemia requiring  4 units of packed red blood cells as well as A. fib requiring beta-blocker for rate control. Wound culture shows abundant Gemella morbilliform and he was started on Vanco/cefepime initially--this was narrowed to Rocephin 2 g twice daily on11/25.2D echo done showed EF of 60 to 65% with increased LVH--no evidence of endocarditis. Dr. Graylon Good recommended 8 weeks of IV antibiotic therapy and will follow up for further  recommendations. Patient with paraplegia and sensory deficits from chest down. Foley in place due to neurogenic bladder and he has not had a bowel movement except for small accident this a.m. Therapy has been ongoing and working on bed mobility. CIR recommended due to functional decline  Current Status:  While the patient was tearful at times but overall he was in good spirits reported no significant symptoms of depression or anxiety.  The patient reports good understanding of purposeful rehabilitative efforts and reports good motivation to continue functioning.  Cognition and mental status were quite good.  Behavioral Observation: Ryan Reed  presents as a 68 y.o.-year-old Right Caucasian Male who appeared his stated age. his dress was Appropriate and he was Well Groomed and his manners were Appropriate to the situation.  his participation was indicative of Appropriate and Attentive behaviors.  There were any physical disabilities noted.  he displayed an appropriate level of cooperation and motivation.     Interactions:    Active Appropriate and Attentive  Attention:   within normal limits and attention span and concentration were age appropriate  Memory:   within normal limits; recent and remote memory intact  Visuo-spatial:  not examined  Speech (Volume):  normal  Speech:   normal; normal  Thought Process:  Coherent and Relevant  Though Content:  WNL; not suicidal and not homicidal  Orientation:   person, place, time/date and situation  Judgment:   Good  Planning:   Good  Affect:    Appropriate  Mood:    Dysphoric  Insight:   Good  Intelligence:   normal  Medical History:  History reviewed. No pertinent past medical history.  Psychiatric History:  No prior psychiatric history.  Family Med/Psych History:  Family History  Problem Relation Age of Onset  . Heart disease Father   . Cardiomyopathy Sister    Impression/DX:  Ryan Reed is a 68 year old  male who has been in relatively good health with the exception of possible undiagnosed obstructive sleep apnea.  The patient developed severe back and chest pain.  Initially seen in urgent care 10/13/2019 with recommendations to go to the ED but did not 3. he developed acute onset of paraplegia with sensory loss and was admitted on 10/14/2019 for work-up.  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  Patient taken the OR emergently for decompressive laminectomy by Dr. Ronnald Ramp.  Patient is recommended for 8 weeks of IV antibiotic therapy.  Patient with paraplegia and sensory deficits from the chest down.  Patient reports now that he has some sensation in his legs but nearly no motor movement from his chest down bilaterally.  While the patient was tearful at times but overall he was in good spirits reported no significant symptoms of depression or anxiety.  The patient reports good understanding of purposeful rehabilitative efforts and reports good motivation to continue functioning.  Cognition and mental status were quite good.  11/04/2019: Today's visit was a follow-up  visit with this patient who I initially saw a couple weeks prior.  The patient reports that his mood is still staying fairly positive although he is quite frustrated with his overall loss of function.  The patient reports that he is somewhat concerned about once he is discharged of being a "burden to his family."  However, the patient reports that he is beginning to get some movement in his right leg which he is hoping will help with transfers so he can be more independent.  11/12/2019:  Patient do go home tomorrow.  Apprehensive and worried about extra burden on wife, but sister is coming up from [facility] for few weeks to help. Patient with progressive but slow improvement in motor function. Patient reports that  he does get emotional thinking about the impact his deficits are having on his family but feels very good about his recovery and denies any significant depressive events other than just thinking about these impacts. The patient has friends who've built a ramp for him and he is having his bathroom modified.  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

## 2019-11-12 NOTE — Discharge Instructions (Signed)
Inpatient Rehab Discharge Instructions  Ryan Reed Discharge date and time: 11/13/19   Activities/Precautions/ Functional Status: Activity: no lifting, driving, or strenuous exercise till cleared by MD Diet: regular diet Wound Care:    Functional status:  ___ No restrictions     ___ Walk up steps independently _X__ 24/7 supervision/assistance   ___ Walk up steps with assistance ___ Intermittent supervision/assistance  ___ Bathe/dress independently ___ Walk with walker     ___ Bathe/dress with assistance ___ Walk Independently    ___ Shower independently ___ Walk with assistance    _X__ Shower with assistance _X__ No alcohol     ___ Return to work/school ________    COMMUNITY REFERRALS UPON DISCHARGE:    Home Health:   PT     OT     RN                      Agency:  [facility]   Phone: [phone]   Medical Equipment/Items Ordered:  Wheelchair, cushion, hospital bed and 30" tf board                                                       Agency/Supplier:  [facility] @ [phone]       Special Instructions: 1. Antibiotic end date 12/09/19. ID will adjust date/antibiotics as needed. 2. Need to record fluid intake and document output (amount cathed) --stop drinking fluids after supper/ 7pm--may need to set an alarm to get up at 5 am to cath.  3.    My questions have been answered and I understand these instructions. I will adhere to these goals and the provided educational materials after my discharge from the hospital.  Patient/Caregiver Signature _______________________________ Date __________  Clinician Signature _______________________________________ Date __________  Please bring this form and your medication list with you to all your follow-up doctor's appointments. Information on my medicine - ELIQUIS (apixaban)  This medication education was reviewed with me or my healthcare representative as part of my discharge preparation.    Why was  Eliquis prescribed for you? Eliquis was prescribed to treat blood clots that may have been found in the veins of your legs (deep vein thrombosis) or in your lungs (pulmonary embolism) and to reduce the risk of them occurring again.  What do You need to know about Eliquis ? The starting dose is 10 mg (two 5 mg tablets) taken TWICE daily for the FIRST SEVEN (7) DAYS, then on (enter date)  10/26/2019  the dose is reduced to ONE 5 mg tablet taken TWICE daily.  Eliquis may be taken with or without food.   Try to take the dose about the same time in the morning and in the evening. If you have difficulty swallowing the tablet whole please discuss with your pharmacist how to take the medication safely.  Take Eliquis exactly as prescribed and DO NOT stop taking Eliquis without talking to the doctor who prescribed the medication.  Stopping may increase your risk of developing a new blood clot.  Refill your prescription before you run out.  After discharge, you should have regular check-up appointments with your healthcare provider that is prescribing your Eliquis.    What do you do if you miss a dose? If a dose of ELIQUIS is not taken at the scheduled  time, take it as soon as possible on the same day and twice-daily administration should be resumed. The dose should not be doubled to make up for a missed dose.  Important Safety Information A possible side effect of Eliquis is bleeding. You should call your healthcare provider right away if you experience any of the following: ? Bleeding from an injury or your nose that does not stop. ? Unusual colored urine (red or dark brown) or unusual colored stools (red or black). ? Unusual bruising for unknown reasons. ? A serious fall or if you hit your head (even if there is no bleeding).  Some medicines may interact with Eliquis and might increase your risk of bleeding or clotting while on Eliquis. To help avoid this, consult your healthcare provider or  pharmacist prior to using any new prescription or non-prescription medications, including herbals, vitamins, non-steroidal anti-inflammatory drugs (NSAIDs) and supplements.  This website has more information on Eliquis (apixaban): http://[email or web address].com/eliquis/home

## 2019-11-12 NOTE — Progress Notes (Signed)
Physical Therapy Session Note  Patient Details  Name: Ryan Reed MRN: [id number] Date of Birth: Sep 01, 1951  Today's Date: 11/12/2019 PT Individual Time: 1400-1500 PT Individual Time Calculation (min): 60 min   Short Term Goals: Week 4:  PT Short Term Goal 1 (Week 4): =LTG due to ELOS  Skilled Therapeutic Interventions/Progress Updates:    Pt received seated in bed, agreeable to PT session. No complaints of pain. Reviewed BLE AAROM therex: heel slides, hip abd, quad sets, ankle pumps. Will provide pt with handout of HEP for therex that family can assist him with performing at home. Supine BP 108/63. Assisted pt with donning BLE TEDs and ACE wrap. Supine to sit mod A for BLE management due to pt not wearing leg loops for time conservation. Seated BP 135/70. Slide board transfer bed to w/c with CGA. Manual w/c propulsion x 150 ft with use of BUE at mod I level. Slide board transfer w/c to/from mat table with CGA. Sitting balance EOM with close SBA while performing ball toss reaching outside BOS. Pt demos improved sitting balance and ability to recover balance with intermittent use of BUE. Seated ball circle pass around trunk rolling ball on mat table front to back to front. Pt is able to perform x 5 repetitions each direction. Slide board transfer back to w/c then back to bed CGA. Sit to supine mod A for BLE management. Pt left semi-reclined in bed with needs in reach at end of session.  Therapy Documentation Precautions:  Precautions Precautions: Cervical, Back Restrictions Weight Bearing Restrictions: No    Therapy/Group: Individual Therapy   Excell Seltzer, PT, DPT  11/12/2019, 3:47 PM

## 2019-11-12 NOTE — Progress Notes (Signed)
Occupational Therapy Session Note  Patient Details  Name: Ryan Reed MRN: [id number] Date of Birth: [birth year]  Today's Date: 11/12/2019 OT Individual Time: 0845-1000 OT Individual Time Calculation (min): 75 min    Short Term Goals: Week 4:  OT Short Term Goal 1 (Week 4): STG=LTG due to LOS  Skilled Therapeutic Interventions/Progress Updates:    Pt seen for OT session focusing on functional transfers and ADL re-training. Pt awake in supine upon arrival, denying pain and agreeable to tx session.  ACE wraps and TED hose donnedtotal A. Pt able to don leg loops with set-up. Transferredto sitting EOB with supervision using hospital bed functions. Shoes donned with max A sitting EOB with pt able to maintain sitting balancewith supervision while shoes donnedwith max A. Completed CGA slidingb oard transfer to padded tub bench with cut out with pillowcase placed over sliding board. Completed modified push up in order for brief to be removed. Pt with small amount of stool in brief. RN performed dig.stim while pt upright with medium size results. Pt able to complete hygiene sitting upright. Total A for clothing management with pt completing push-up in order to advance pants over hips. CGA sliding board transfer back to bed. Pt left in supine with all needs in reach.  Education/discussion throughout session regarding bowel program, DME, benefits of upright toileting, and discharge planning.   Therapy Documentation Precautions:  Precautions Precautions: Cervical, Back Restrictions Weight Bearing Restrictions: No   Therapy/Group: Individual Therapy  Kregg Cihlar L 11/12/2019, 6:42 AM

## 2019-11-13 ENCOUNTER — Inpatient Hospital Stay: Payer: Medicare Other | Admitting: Internal Medicine

## 2019-11-13 ENCOUNTER — Inpatient Hospital Stay (HOSPITAL_COMMUNITY): Payer: Medicare Other | Admitting: Physical Therapy

## 2019-11-13 ENCOUNTER — Inpatient Hospital Stay (HOSPITAL_COMMUNITY): Payer: Medicare Other | Admitting: Occupational Therapy

## 2019-11-13 DIAGNOSIS — G061 Intraspinal abscess and granuloma: Secondary | ICD-10-CM | POA: Diagnosis not present

## 2019-11-13 DIAGNOSIS — Z23 Encounter for immunization: Secondary | ICD-10-CM | POA: Diagnosis not present

## 2019-11-13 DIAGNOSIS — G8250 Quadriplegia, unspecified: Secondary | ICD-10-CM | POA: Diagnosis not present

## 2019-11-13 DIAGNOSIS — B9689 Other specified bacterial agents as the cause of diseases classified elsewhere: Secondary | ICD-10-CM | POA: Diagnosis not present

## 2019-11-13 LAB — URINE CULTURE: Culture: NO GROWTH

## 2019-11-13 MED ORDER — APIXABAN 5 MG PO TABS: 5.0000 mg | ORAL_TABLET | Freq: Two times a day (BID) | ORAL | 1 refills | Status: DC

## 2019-11-13 MED ORDER — TRAZODONE HCL 50 MG PO TABS: 25.0000 mg | ORAL_TABLET | Freq: Every evening | ORAL | 0 refills | Status: DC | PRN

## 2019-11-13 MED ORDER — COLLAGENASE 250 UNIT/GM EX OINT: TOPICAL_OINTMENT | Freq: Two times a day (BID) | CUTANEOUS | 0 refills | Status: DC

## 2019-11-13 MED ORDER — TRAMADOL HCL 50 MG PO TABS: 50.0000 mg | ORAL_TABLET | Freq: Four times a day (QID) | ORAL | 0 refills | Status: DC | PRN

## 2019-11-13 MED ORDER — METOPROLOL TARTRATE 25 MG PO TABS: 12.5000 mg | ORAL_TABLET | Freq: Two times a day (BID) | ORAL | 1 refills | Status: AC

## 2019-11-13 MED ORDER — BISACODYL 10 MG RE SUPP: 10.0000 mg | Freq: Every day | RECTAL | 0 refills | Status: DC

## 2019-11-13 MED ORDER — HEPARIN SOD (PORK) LOCK FLUSH 100 UNIT/ML IV SOLN
250.0000 [IU] | INTRAVENOUS | Status: AC | PRN
  Administered 2019-11-13: 250 [IU]
  Filled 2019-11-13: qty 2.5

## 2019-11-13 MED ORDER — TRAMADOL HCL 50 MG PO TABS: 50.0000 mg | ORAL_TABLET | Freq: Four times a day (QID) | ORAL | 0 refills | Status: AC | PRN

## 2019-11-13 MED ORDER — FERROUS SULFATE 325 (65 FE) MG PO TABS: 325.0000 mg | ORAL_TABLET | Freq: Two times a day (BID) | ORAL | 3 refills | Status: DC

## 2019-11-13 MED ORDER — CYCLOBENZAPRINE HCL 5 MG PO TABS: 5.0000 mg | ORAL_TABLET | Freq: Three times a day (TID) | ORAL | 0 refills | Status: DC | PRN

## 2019-11-13 MED ORDER — SENNA 8.6 MG PO TABS: 2.0000 | ORAL_TABLET | Freq: Every day | ORAL | 0 refills | Status: AC

## 2019-11-13 MED FILL — FERROUS SULFATE 325 MG TAB: 325 (65 FE) | 30 days supply | Qty: 60 | Fill #0

## 2019-11-13 MED FILL — METOPROLOL TARTRATE 25 MG T: 25 | 60 days supply | Qty: 30 | Fill #0

## 2019-11-13 MED FILL — ELIQUIS 5 MG TABLET: 5 | 30 days supply | Qty: 60 | Fill #0

## 2019-11-13 MED FILL — CYCLOBENZAPRINE HCL 5 MG TA: 5 | 20 days supply | Qty: 60 | Fill #0

## 2019-11-13 MED FILL — traMADol HCL 50 MG TABS: 50 | 5 days supply | Qty: 30 | Fill #0

## 2019-11-13 MED FILL — traZODone HCL 50 MG TABS: 50 | 30 days supply | Qty: 30 | Fill #0

## 2019-11-13 NOTE — Progress Notes (Signed)
Occupational Therapy Session Note  Patient Details  Name: Ryan Reed MRN: [id number] Date of Birth: Mar 21, 1951  Today's Date: 11/13/2019 OT Individual Time: [id number] OT Individual Time Calculation (min): 45 min    Short Term Goals: Week 4:  OT Short Term Goal 1 (Week 4): STG=LTG due to LOS  Skilled Therapeutic Interventions/Progress Updates:    Pt seen for OT ADL Bathing/dressing session. Pt awake in supine upon arrival, denying pain and agreeable to tx session.  ADL re-training: Completed LB bathing/dressing from bed level in supported circle and long sitting positions with set-up assist overall.Returned to supine to roll to pull pants up. Total A for TED hose and ACE wraps. Pt donned shoes with set-up. UB bathing/dressing and grooming tasks completed from w/c level mod I.  Transfers: Bed mobility mod I using hospital bed functions. Supine>sitting EOB with supervision using leg loops to assist with transferring LEs off EOB.  Sliding board transfer EOB> w/c with min A and assist to place and stabilize equipment.   Education: Pt provided with inspection mirror and educated regarding use for sacral and foot inspections and importance of maintaining skin integrity. Continuum of care, activity progression DME, pressure relief and discharge planning.   Pt left seated in w/c at end of session, completing ADLs at sink, all needs in reach.   Therapy Documentation Precautions:  Precautions Precautions: Cervical, Back Restrictions Weight Bearing Restrictions: No   Therapy/Group: Individual Therapy  Madalynne Gutmann L 11/13/2019, 6:56 AM

## 2019-11-13 NOTE — Progress Notes (Signed)
Physical Therapy Session Note  Patient Details  Name: Ryan Reed MRN: [id number] Date of Birth: [birth year]  Today's Date: 11/13/2019 PT Individual Time: 1000-1105; 1330-1405 PT Individual Time Calculation (min): 65 min and 35 min  Short Term Goals: Week 4:  PT Short Term Goal 1 (Week 4): =LTG due to ELOS  Skilled Therapeutic Interventions/Progress Updates:    Session 1: Pt received seated in w/c in room, agreeable to PT session. No complaints of pain. Manual w/c propulsion 150 ft (+) at mod I level. Pt is independent for management of w/c parts. Slide board transfer w/c to/from mat table with CGA. Seated balance EOM with occasional min A needed for balance recovery: ball toss progressing to volleyball reaching outside BOS; zoom ball with focus on no UE support on mat table with external perturbations; ball circle pass around trunk x 5 reps each direction; chest press with volleyball 2 x 10 reps. Pt requests to return to bed at end of session. Slide board transfer back to bed CGA. Sit to supine Supervision with pt using leg lifters for BLE management. Pt left seated in bed with needs in reach, wife present at end of session.  Session 2: Pt received seated in bed, ready for discharge home. Bed mobility Supervision. Slide board transfer to w/c min A. Assisted pt downstairs to w/c accessible van. Assisted pt's wife with connecting w/c to hooks in [name] and buckling seat belt. Pt left in care of wife in order to discharge home safely.  Therapy Documentation Precautions:  Precautions Precautions: Cervical, Back Restrictions Weight Bearing Restrictions: No    Therapy/Group: Individual Therapy   Excell Seltzer, PT, DPT  11/13/2019, 12:44 PM

## 2019-11-13 NOTE — Progress Notes (Signed)
Occupational Therapy Discharge Summary  Patient Details  Name: Ryan Reed MRN: [id number] Date of Birth: November 18, 1951   Patient has met 8 of 8 long term goals due to improved activity tolerance, improved balance, postural control, ability to compensate for deficits and improved coordination.  Patient to discharge at overall Supervision level.  Patient's care partner is independent to provide the necessary physical assistance at discharge.  Pt's wife anddaughter have completed hands on family training. They voice and demonstrate willing and ableness to provide the needed set-up/supervision assist pt requires for ADLs.  He requires TED hose and ACE wraps for BP management when OOB. He is completing sliding board transfers with close [place]. LB bathing/dressing completed from bed level with set-up and use of hospital bed functions.  UB bathing/dressing from w/c level mod I. He is able to self-cath with set-up. On PM bowel program with AM rectal clearing. Trialing upright void as part of bowel program on padded tub bench with cut out, requiring assist for clothing management.   Recommendation:  Patient will benefit from ongoing skilled OT services in home health setting to continue to advance functional skills in the area of BADL, iADL and Reduce care partner burden.  Equipment: Sliding board, hospital bed. Pt to private purchase padded tub bench with cut out  Reasons for discharge: treatment goals met and discharge from hospital  Patient/family agrees with progress made and goals achieved: Yes  OT Discharge Precautions/Restrictions  Precautions Precautions: Cervical;Back Restrictions Weight Bearing Restrictions: No General   Vital Signs   Pain   ADL ADL Eating: Set up Grooming: Setup Upper Body Bathing: Minimal assistance, Moderate assistance Lower Body Bathing: Maximal assistance, Dependent Upper Body Dressing: Minimal assistance, Moderate assistance Lower Body  Dressing: Dependent Toileting: Dependent Toilet Transfer: Unable to assess Tub/Shower Transfer: Unable to assess Vision Baseline Vision/History: Wears glasses Wears Glasses: Reading only Vision Assessment?: No apparent visual deficits Perception  Perception: Within Functional Limits Praxis Praxis: Intact Cognition Overall Cognitive Status: Within Functional Limits for tasks assessed Arousal/Alertness: Awake/alert Orientation Level: Oriented X4 Attention: Focused Focused Attention: Appears intact Memory: Appears intact Awareness: Appears intact Problem Solving: Appears intact Safety/Judgment: Appears intact Sensation Sensation Light Touch: Impaired Detail Light Touch Impaired Details: Impaired RLE;Impaired LLE Proprioception: Impaired Detail Proprioception Impaired Details: Impaired RLE;Absent LLE Additional Comments: mild decreased appreciation to light touch in BLE L>R Coordination Gross Motor Movements are Fluid and Coordinated: No Fine Motor Movements are Fluid and Coordinated: Yes Coordination and Movement Description: 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 Motor  Motor Motor: Tetraplegia;Abnormal tone;Abnormal postural alignment and control Motor - Discharge Observations: tetraplegia Mobility  Bed Mobility Bed Mobility: Rolling Right;Rolling Left;Sit to Supine;Supine to Sit Rolling Right: Supervision/verbal cueing Rolling Left: Supervision/Verbal cueing Supine to Sit: Supervision/Verbal cueing Sit to Supine: Supervision/Verbal cueing  Trunk/Postural Assessment  Cervical Assessment Cervical Assessment: Exceptions to WFL(Forward head; Cervical pre-cautions) Thoracic Assessment Thoracic Assessment: Exceptions to WFL(Rounded shoulders; kyphotic) Lumbar Assessment Lumbar Assessment: Exceptions to WFL(Posterior pelvic tilt) Postural Control Postural Control: Deficits on evaluation(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)  Balance Balance Balance Assessed: Yes Static Sitting  Balance Static Sitting - Balance Support: No upper extremity supported;Feet supported Static Sitting - Level of Assistance: 5: Stand by assistance Static Sitting - Comment/# of Minutes: Sitting EOB Dynamic Sitting Balance Dynamic Sitting - Balance Support: No upper extremity supported;Feet supported;During functional activity Dynamic Sitting - Level of Assistance: 5: Stand by assistance;4: Min assist Sitting balance - Comments: Sitting EOB during ADL task Extremity/Trunk Assessment RUE Assessment RUE Assessment: Within Functional Limits LUE Assessment  LUE Assessment: Within Functional Limits   Philopateer Strine L 11/13/2019, 4:56 PM

## 2019-11-13 NOTE — Progress Notes (Signed)
Recreational Therapy Discharge Summary Patient Details  Name: Ryan Reed MRN: [id number] Date of Birth: 1951-09-05 Today's Date: 11/13/2019  Long term goals set: 2  Long term goals met: 2  Comments on progress toward goals: Pt has made excellent progress during LOS and is ready for discharge home with wife and family to provide 24 hour supervision/assistance.  TR sessions/education have been focused on activity tolerance, transfers, sitting balance, leisure education, activity analysis identifying potential modifications, community reintegration,   Pt is supervision for simple TR tasks w/c level and seated EOM for short periods of time.  Pt is also supervision for community reintegration/mobiltiy tasks on level surfaces, but may require min assist on uneven surfaces.   Also provided education to pt & family on safe postioning in w/c accessible van and placement of w/c tie down straps on the w/c frame.  All state understanding.  Goals met.  Reasons for discharge: discharge from hospital Patient/family agrees with progress made and goals achieved: Yes  [name] 11/13/2019, 11:20 AM

## 2019-11-13 NOTE — Plan of Care (Signed)
  Problem: Consults Goal: RH SPINAL CORD INJURY PATIENT EDUCATION Description:  See Patient Education module for education specifics.  Outcome: Progressing   Problem: Consults Goal: RH SPINAL CORD INJURY PATIENT EDUCATION Description:  See Patient Education module for education specifics.  Outcome: Progressing   Problem: SCI BOWEL ELIMINATION Goal: RH STG SCI MANAGE BOWEL PROGRAM W/ASSIST OR AS APPROPRIATE Description: STG SCI Manage bowel program w/assist or as appropriate. Mod Outcome: Progressing   Problem: SCI BLADDER ELIMINATION Goal: RH STG SCI MANAGE BLADDER PROGRAM W/ASSISTANCE Description: Patient will manage bladder with mod assist Outcome: Progressing   Problem: RH SKIN INTEGRITY Goal: RH STG MAINTAIN SKIN INTEGRITY WITH ASSISTANCE Description: STG Maintain Skin Integrity With Assistance. Mod Outcome: Progressing Goal: RH STG ABLE TO PERFORM INCISION/WOUND CARE W/ASSISTANCE Description: STG Able To Perform Incision/Wound Care With Assistance. Max Outcome: Progressing

## 2019-11-13 NOTE — Discharge Summary (Signed)
Physician Discharge Summary  Patient ID: Ryan Reed MRN: [id number] DOB/AGE: 06/11/1951 68 y.o.  Admit date: 10/18/2019 Discharge date: 11/13/2019  Discharge Diagnoses:  Principal Problem:   Quadriplegia [facility]) Active Problems:   Epidural abscess   Gemella infection   Bacterial spinal epidural abscess   Constipation   Neurogenic bladder   Neurogenic bowel   Anemia   Pressure injury of skin   Discharged Condition:  Stable   Significant Diagnostic Studies: DG Abd 1 View  Result Date: 11/08/2019 CLINICAL DATA:  Frequent stools. Abdominal pain. EXAM: ABDOMEN - 1 VIEW COMPARISON:  10/18/2019 FINDINGS: Nondistended gas-filled loops of small bowel and colon noted. A moderate amount of stool within the colon is present. No dilated bowel loops are present. No suspicious calcifications are present. No acute bony abnormalities are noted. IMPRESSION: 1. Nonspecific bowel gas pattern. No evidence of bowel obstruction. 2. Moderate colonic stool. Electronically Signed   By: Margarette Canada M.D.   On: 11/08/2019 14:31   DG Abd 1 View  Result Date: 10/18/2019 CLINICAL DATA:  Constipation EXAM: ABDOMEN - 1 VIEW COMPARISON:  None. FINDINGS: There is gaseous distention of the colon. The stool burden is average. The bowel gas pattern is nonobstructive. There are no radiopaque kidney stones. IMPRESSION: 1. Gaseous distention of the colon. 2. Average stool burden. Electronically Signed   By: Constance Holster M.D.   On: 10/18/2019 20:53   VAS [place] LOWER EXTREMITY VENOUS (DVT)  Result Date: 10/20/2019  Lower Venous Study Indications: Paraplegia.  Comparison Study: No prior study on file for comparison Performing Technologist: Sharion Dove RVS  Examination Guidelines: A complete evaluation includes B-mode imaging, spectral Doppler, color Doppler, and power Doppler as needed of all accessible portions of each vessel. Bilateral testing is considered an integral part of a complete examination. Limited  examinations for reoccurring indications may be performed as noted.  +---------+---------------+---------+-----------+----------+--------------+ RIGHT    CompressibilityPhasicitySpontaneityPropertiesThrombus Aging +---------+---------------+---------+-----------+----------+--------------+ CFV      Full           Yes      Yes                                 +---------+---------------+---------+-----------+----------+--------------+ SFJ      Full                                                        +---------+---------------+---------+-----------+----------+--------------+ FV Prox  Full                                                        +---------+---------------+---------+-----------+----------+--------------+ FV Mid   Full                                                        +---------+---------------+---------+-----------+----------+--------------+ FV DistalFull                                                        +---------+---------------+---------+-----------+----------+--------------+  PFV      Full                                                        +---------+---------------+---------+-----------+----------+--------------+ POP      Full           Yes      Yes                                 +---------+---------------+---------+-----------+----------+--------------+ PTV      Full                                                        +---------+---------------+---------+-----------+----------+--------------+ PERO     Full                                                        +---------+---------------+---------+-----------+----------+--------------+ Soleal   Full                                                        +---------+---------------+---------+-----------+----------+--------------+ Gastroc  None                                         Acute           +---------+---------------+---------+-----------+----------+--------------+   +---------+---------------+---------+-----------+----------+--------------+ LEFT     CompressibilityPhasicitySpontaneityPropertiesThrombus Aging +---------+---------------+---------+-----------+----------+--------------+ CFV      Full                                                        +---------+---------------+---------+-----------+----------+--------------+ SFJ      Full                                                        +---------+---------------+---------+-----------+----------+--------------+ FV Prox  Full                                                        +---------+---------------+---------+-----------+----------+--------------+ FV Mid   Full                                                        +---------+---------------+---------+-----------+----------+--------------+  FV DistalFull                                                        +---------+---------------+---------+-----------+----------+--------------+ PFV      Full                                                        +---------+---------------+---------+-----------+----------+--------------+ POP      Full                                                        +---------+---------------+---------+-----------+----------+--------------+ PTV      Full                                                        +---------+---------------+---------+-----------+----------+--------------+ PERO     None                                         Acute          +---------+---------------+---------+-----------+----------+--------------+ Gastroc  None                                         Acute          +---------+---------------+---------+-----------+----------+--------------+     Summary: Right: Findings consistent with acute deep vein thrombosis involving the right gastrocnemius veins. Left:  Findings consistent with acute deep vein thrombosis involving the left gastrocnemius veins, and left peroneal veins.  *See table(s) above for measurements and observations. Electronically signed by Curt Jews MD on 10/20/2019 at 7:48:11 AM.    Final    [place] EKG SITE RITE  Result Date: 10/16/2019 If Site Rite image not attached, placement could not be confirmed due to current cardiac rhythm.   Labs:  Basic Metabolic Panel: BMP Latest Ref Rng & Units 11/11/2019 11/11/2019 11/04/2019  Glucose 70 - 99 mg/dL 116(H) 101(H) 102(H)  BUN 8 - 23 mg/dL '15 14 10  ' Creatinine 0.61 - 1.24 mg/dL 0.66 0.56(L) 0.54(L)  Sodium 135 - 145 mmol/L 137 139 138  Potassium 3.5 - 5.1 mmol/L 3.6 3.9 3.9  Chloride 98 - 111 mmol/L 103 103 103  CO2 22 - 32 mmol/L '26 26 25  ' Calcium 8.9 - 10.3 mg/dL 8.6(L) 8.7(L) 8.4(L)    CBC: CBC Latest Ref Rng & Units 11/11/2019 11/11/2019 11/04/2019  WBC 4.0 - 10.5 K/uL 5.7 4.7 5.2  Hemoglobin 13.0 - 17.0 g/dL 9.9(L) 9.9(L) 9.1(L)  Hematocrit 39.0 - 52.0 % 31.3(L) 31.7(L) 28.8(L)  Platelets 150 - 400 K/uL 314 321 438(H)    CBG: No results for input(s): GLUCAP in the last 168 hours.  Brief HPI:  Ryan Reed is a 68 y.o. male in relatively good health except for possibly undiagnosed OSA, normocytic anemia who developed severe back and chest pain on 10/13/2019.  He was evaluated at urgent care with recommendations to go to ED but declined.  He developed acute onset of paraplegia with sensory loss and was admitted on 10/14/2019 for work-up.  He was found to have very large ventral epidural abscess from C7 to thoracic spine causing cord compression, left lateral epidural abscess at C7-T1 and left septic arthritis C7-T1.  He was taken to the OR emergently for C7-T7 posterior decompressive laminectomy with medial facetectomy and foraminotomy for evacuation of epidural abscess by Dr. Ronnald Ramp.  Postop had acute blood loss anemia requiring 4 units of packed red blood cells.  He also  developed A. fib requiring addition of beta-blocker for rate control.  Wound culture showed abundant Gemella morbilliform and he was started on broad-spectrum antibiotics which were narrowed to Rocephin 2 g twice daily per ID input.  2D echo showed no evidence of endocarditis and Dr. Graylon Good recommended 8 weeks IV antibiotic therapy with end date of 12/09/19. He continued to be limited by paraplegia with sensory deficits from chest down, required foley due to urinary retention and has had constipation.  Therapy ongoing and has been working on bed mobility.  CIR was recommended due to functional decline.   Hospital Course: Ryan Reed was admitted to rehab 10/18/2019 for inpatient therapies to consist of PT, ST and OT at least three hours five days a week. Past admission physiatrist, therapy team and rehab RN have worked together to provide customized collaborative inpatient rehab. Bilateral lower extremity Dopplers ordered for work-up and showed bilateral gastroc DVTs and he was started on Eliquis for treatment.  Oxycodone was discontinued due to side effects and tramadol as well as muscle relaxers have been used on as needed basis.  Air mattress and pressure relief measures were used for local care for sacral breakdown.  Surgical incision is clean/dry/intact and is healing well without any signs or symptoms of infection.  His blood pressures were monitored on TID basis and he was limited by orthostatic symptoms. TEDs with ace wraps have been used to help support BP when out of bed.  Metoprolol was decreased to 12.5 mg twice daily and heart rate is controlled on this dose.  Pain is controlled with as needed use of tramadol and sleep-wake cycle is improving.  He was advised to use melatonin at home at bedtime instead of tramadol.    He was maintained on IV rocephin bid with serial labs. Follow up BMET showed that lytes and renal status WNL. Stress induced hyperglycemia has greatly improved  --Hgb A1c- 5.6.   Follow up CBC showed acute on chronic normochromic normocytic anemia and iron supplement was added.  Follow-up CBC shows H&H has improved from 8.4-9.9 and white count and platelets are stable.  CRP is down from 11.6 to 0.6.  Foley discontinued and he was started on bladder program due to neurogenic bladder. He has been educated on s/s of autonomic dysreflexia and keeping a journal of events to record triggers. He has been educated on monitoring I/O and is able to self cath with set up 4-5 X day. Bowel program was initiated with suppository at nights and am rectal clearing added to prevent incontinence during the day. Dr. Sima Matas has been following patient for psychological support during his stay. He has been making steady gains during rehab stay and currently requires min  to mod assist at wheelchair level.  He will continue to receive follow up HHPT, [place] and [place] [facility] [place] after discharge   Rehab course: During patient's stay in rehab weekly team conferences were held to monitor patient's progress, set goals and discuss barriers to discharge. At admission, patient required total assist with mobility and basic self care tasks. He  has had improvement in activity tolerance, balance, postural control as well as ability to compensate for deficits. He has had improvement in functional use RUE/LUE  and RLE/LLE as well as improvement in awareness.  He requires min to mod assist for UB and max assist for LB bathing. He is able to complete LB bathing/dressing at bed level with set up in hospital bed. He requires supervision with cues for bed mobility and CGA/min assist for lateral scoot transfers with use of SB. He is able to stand with min assist. He is able to propel his wheelchair for 500'. Hands on family education completed with wife regarding B/B program as well as all aspects of care.   Discharge disposition: 01-Home or Self Care  Diet: Regular  Special Instructions: 1.Need to maintain  current bowel and bladder program. 2. Antibiotic end date 01/18--ID to adjust as needed after follow up. 3. Stop drinking fluids after supper--use ice chips. Keep record of I/O.  4.Santyl with damp to dry dressing bid to sacral decub.   Discharge Instructions    Ambulatory referral to Physical Medicine Rehab   Complete by: As directed    Home infusion instructions [facility] May follow [place] Dosing Protocol; May administer Cathflo as needed to maintain patency of vascular access device.; Flushing of vascular access device: per [facility] Protocol: 0.9% NaCl pre/post medica...   Complete by: As directed    Instructions: May follow [place] Dosing Protocol   Instructions: May administer Cathflo as needed to maintain patency of vascular access device.   Instructions: Flushing of vascular access device: per [facility] Protocol: 0.9% NaCl pre/post medication administration and prn patency; Heparin 100 u/ml, 25m for implanted ports and Heparin 10u/ml, 522mfor all other central venous catheters.   Instructions: May follow [facility] Anaphylaxis Protocol for First Dose Administration in the home: 0.9% NaCl at 25-50 ml/hr to maintain IV access for protocol meds. Epinephrine 0.3 ml IV/IM PRN and Benadryl 25-50 IV/IM PRN s/s of anaphylaxis.   Instructions: AdReedernfusion Coordinator (RN) to assist per patient IV care needs in the home PRN.     Allergies as of 11/13/2019   No Known Allergies     Medication List    STOP taking these medications   HYDROcodone-acetaminophen 5-325 MG tablet Commonly known as: NORCO/VICODIN   tiZANidine 2 MG tablet Commonly known as: ZANAFLEX     TAKE these medications   acetaminophen 325 MG tablet Commonly known as: TYLENOL Take 1-2 tablets (325-650 mg total) by mouth every 4 (four) hours as needed for mild pain.   apixaban 5 MG Tabs tablet Commonly known as: ELIQUIS Take 1 tablet (5 mg total) by mouth 2 (two) times daily.   bisacodyl 10 MG  suppository Commonly known as: DULCOLAX Place 1 suppository (10 mg total) rectally daily after supper.   cefTRIAXone  IVPB Commonly known as: ROCEPHIN Inject 2 g into the vein every 12 (twelve) hours. Indication:  Epidural abscess with gemella species  Last Day of Therapy:  12/09/2019 Labs - Once weekly:  CBC/D and BMP, Labs - Every other week:  ESR and CRP  collagenase ointment Commonly known as: SANTYL Apply topically 2 (two) times daily. Apply to affected area   cyclobenzaprine 5 MG tablet Commonly known as: FLEXERIL Take 1 tablet (5 mg total) by mouth 3 (three) times daily as needed for muscle spasms.   ferrous sulfate 325 (65 FE) MG tablet Take 1 tablet (325 mg total) by mouth 2 (two) times daily with a meal.   metoprolol tartrate 25 MG tablet Commonly known as: LOPRESSOR Take 0.5 tablets (12.5 mg total) by mouth 2 (two) times daily.   multivitamin with minerals Tabs tablet Take 1 tablet by mouth daily.   polyethylene glycol 17 g packet Commonly known as: MIRALAX / GLYCOLAX Take 17 g by mouth daily as needed for mild constipation.   senna 8.6 MG Tabs tablet Commonly known as: SENOKOT Take 2 tablets (17.2 mg total) by mouth daily.   traMADol 50 MG tablet--Rx# 30 pills. Commonly known as: ULTRAM Take 1-2 tablets (50-100 mg total) by mouth 4 (four) times daily as needed for up to 7 days for moderate pain or severe pain (50 mg for moderate pain and 100 mg for severe pain).   traZODone 50 MG tablet Commonly known as: DESYREL Take 0.5-1 tablets (25-50 mg total) by mouth at bedtime as needed for sleep.            Home Infusion Instuctions  (From admission, onward)         Start     Ordered   11/07/19 0000  Home infusion instructions [facility] May follow [place] Dosing Protocol; May administer Cathflo as needed to maintain patency of vascular access device.; Flushing of vascular access device: per [facility] Protocol: 0.9% NaCl pre/post medica...     Question Answer Comment  Instructions May follow [place] Dosing Protocol   Instructions May administer Cathflo as needed to maintain patency of vascular access device.   Instructions Flushing of vascular access device: per [facility] Protocol: 0.9% NaCl pre/post medication administration and prn patency; Heparin 100 u/ml, 72m for implanted ports and Heparin 10u/ml, 554mfor all other central venous catheters.   Instructions May follow [facility] Anaphylaxis Protocol for First Dose Administration in the home: 0.9% NaCl at 25-50 ml/hr to maintain IV access for protocol meds. Epinephrine 0.3 ml IV/IM PRN and Benadryl 25-50 IV/IM PRN s/s of anaphylaxis.   Instructions [facility] Infusion Coordinator (RN) to assist per patient IV care needs in the home PRN.      11/07/19 [id number]       Follow-up Information    [place], MeJinny BlossomMD Follow up.   Specialty: Physical Medicine and Rehabilitation Why: Office will call you with follow up appointment Contact information: [id number]. [phone]        SnCarlyle BasquesMD Follow up on 11/26/2019.   Specialty: Infectious Diseases Why: Appointment at 2:30 pm Contact information: [phone]        MaBjorn LoserMD Follow up on 12/12/2019.   Specialty: Urology Why: be there at 1:15 pm for 1:45 appointment  Contact information: [address] [phone]        JoEustace MooreMD. Call on 11/14/2019.   Specialty: Neurosurgery Why: for post op appointment Contact information: [phone]        BaChesley NoonMD. Call on 11/14/2019.   Specialty: Family Medicine Why:  for post hospital follow up Contact information: [place] [address] [phone]           Signed: Bary Leriche 11/13/2019, 8:40 PM

## 2019-11-13 NOTE — Plan of Care (Signed)
Problem: RH Balance Goal: LTG: Patient will maintain dynamic sitting balance (OT) Description: LTG:  Patient will maintain dynamic sitting balance with assistance during activities of daily living (OT) Outcome: Completed/Met   Problem: RH Grooming Goal: LTG Patient will perform grooming w/assist,cues/equip (OT) Description: LTG: Patient will perform grooming with assist, with/without cues using equipment (OT) Outcome: Completed/Met   Problem: RH Bathing Goal: LTG Patient will bathe all body parts with assist levels (OT) Description: LTG: Patient will bathe all body parts with assist levels (OT) Outcome: Completed/Met   Problem: RH Dressing Goal: LTG Patient will perform upper body dressing (OT) Description: LTG Patient will perform upper body dressing with assist, with/without cues (OT). Outcome: Completed/Met Goal: LTG Patient will perform lower body dressing w/assist (OT) Description: LTG: Patient will perform lower body dressing with assist, with/without cues in positioning using equipment (OT) Outcome: Completed/Met   Problem: RH Toileting Goal: LTG Patient will perform toileting task (3/3 steps) with assistance level (OT) Description: LTG: Patient will perform toileting task (3/3 steps) with assistance level (OT)  Outcome: Completed/Met   Problem: RH Functional Use of Upper Extremity Goal: LTG Patient will use RT/LT upper extremity as a (OT) Description: LTG: Patient will use right/left upper extremity as a stabilizer/gross assist/diminished/nondominant/dominant level with assist, with/without cues during functional activity (OT) Outcome: Completed/Met   Problem: RH Toilet Transfers Goal: LTG Patient will perform toilet transfers w/assist (OT) Description: LTG: Patient will perform toilet transfers with assist, with/without cues using equipment (OT) Outcome: Completed/Met   Problem: Consults Goal: RH SPINAL CORD INJURY PATIENT EDUCATION Description:  See Patient  Education module for education specifics.  11/13/2019 1150 by Ellison Carwin A, LPN Outcome: Completed/Met 11/13/2019 1150 by Ellison Carwin A, LPN Outcome: Progressing   Problem: RH Balance Goal: LTG Patient will maintain dynamic sitting balance (PT) Description: LTG:  Patient will maintain dynamic sitting balance with assistance during mobility activities (PT) Outcome: Completed/Met Goal: LTG Patient will maintain dynamic standing balance (PT) Description: LTG:  Patient will maintain dynamic standing balance with assistance during mobility activities (PT) Outcome: Completed/Met   Problem: Sit to Stand Goal: LTG:  Patient will perform sit to stand with assistance level (PT) Description: LTG:  Patient will perform sit to stand with assistance level (PT) Outcome: Completed/Met   Problem: RH Bed Mobility Goal: LTG Patient will perform bed mobility with assist (PT) Description: LTG: Patient will perform bed mobility with assistance, with/without cues (PT). Outcome: Completed/Met   Problem: RH Bed to Chair Transfers Goal: LTG Patient will perform bed/chair transfers w/assist (PT) Description: LTG: Patient will perform bed to chair transfers with assistance (PT). Outcome: Completed/Met   Problem: RH Car Transfers Goal: LTG Patient will perform car transfers with assist (PT) Description: LTG: Patient will perform car transfers with assistance (PT). Outcome: Completed/Met   Problem: RH Ambulation Goal: LTG Patient will ambulate in controlled environment (PT) Description: LTG: Patient will ambulate in a controlled environment, # of feet with assistance (PT). Outcome: Completed/Met   Problem: RH Wheelchair Mobility Goal: LTG Patient will propel w/c in controlled environment (PT) Description: LTG: Patient will propel wheelchair in controlled environment, # of feet with assist (PT) Outcome: Completed/Met Goal: LTG Patient will propel w/c in home environment (PT) Description: LTG:  Patient will propel wheelchair in home environment, # of feet with assistance (PT). Outcome: Completed/Met   Problem: Consults Goal: RH SPINAL CORD INJURY PATIENT EDUCATION Description:  See Patient Education module for education specifics.  11/13/2019 1150 by Ellison Carwin A, LPN Outcome: Completed/Met 11/13/2019 1150  by Adria Devon, LPN Outcome: Progressing   Problem: SCI BOWEL ELIMINATION Goal: RH STG SCI MANAGE BOWEL PROGRAM W/ASSIST OR AS APPROPRIATE Description: STG SCI Manage bowel program w/assist or as appropriate. Mod 11/13/2019 1150 by Ellison Carwin A, LPN Outcome: Completed/Met 11/13/2019 1150 by Ellison Carwin A, LPN Outcome: Progressing   Problem: SCI BLADDER ELIMINATION Goal: RH STG SCI MANAGE BLADDER PROGRAM W/ASSISTANCE Description: Patient will manage bladder with mod assist 11/13/2019 1150 by Ellison Carwin A, LPN Outcome: Completed/Met 11/13/2019 1150 by Ellison Carwin A, LPN Outcome: Progressing   Problem: RH SKIN INTEGRITY Goal: RH STG MAINTAIN SKIN INTEGRITY WITH ASSISTANCE Description: STG Maintain Skin Integrity With Assistance. Mod 11/13/2019 1150 by Ellison Carwin A, LPN Outcome: Completed/Met 11/13/2019 1150 by Ellison Carwin A, LPN Outcome: Progressing Goal: RH STG ABLE TO PERFORM INCISION/WOUND CARE W/ASSISTANCE Description: STG Able To Perform Incision/Wound Care With Assistance. Max 11/13/2019 1150 by Adria Devon, LPN Outcome: Completed/Met 11/13/2019 1150 by Adria Devon, LPN Outcome: Progressing

## 2019-11-13 NOTE — Progress Notes (Signed)
Education provided during shift to wife and patient on bowel program, wound care and I/o cath. All questions were answered and both understood. Adria Devon, LPN

## 2019-11-13 NOTE — Progress Notes (Signed)
Physical Therapy Discharge Summary  Patient Details  Name: Ryan Reed MRN: [id number] Date of Birth: June 16, 1951  Today's Date: 11/13/2019    Patient has met 6 of 6 long term goals due to improved activity tolerance, improved balance, improved postural control, increased strength and ability to compensate for deficits.  Patient to discharge at a wheelchair level J[place].   Patient's care partner is independent to provide the necessary physical assistance at discharge. Patient's wife and daughter have completed hands-on family education and are safe to assist pt with bed mobility and transfers at home.  Reasons goals not met: Patient has met all rehab goals.  Recommendation:  Patient will benefit from ongoing skilled PT services in home health setting to continue to advance safe functional mobility, address ongoing impairments in endurance, strength, balance, safety, independence with functional mobility, and minimize fall risk.  Equipment: custom manual w/c, 30" slide board, hospital bed  Reasons for discharge: treatment goals met and discharge from hospital  Patient/family agrees with progress made and goals achieved: Yes  PT Discharge Precautions/Restrictions Precautions Precautions: Cervical;Back Restrictions Weight Bearing Restrictions: No Vision/Perception  Perception Perception: Within Functional Limits Praxis Praxis: Intact  Cognition Overall Cognitive Status: Within Functional Limits for tasks assessed Arousal/Alertness: Awake/alert Orientation Level: Oriented X4 Attention: Focused Focused Attention: Appears intact Memory: Appears intact Awareness: Appears intact Problem Solving: Appears intact Safety/Judgment: Appears intact Sensation Sensation Light Touch: Impaired Detail Light Touch Impaired Details: Impaired RLE;Impaired LLE(LLE>RLE) Proprioception: Impaired Detail Proprioception Impaired Details: Impaired RLE;Absent LLE Coordination Gross Motor  Movements are Fluid and Coordinated: No Fine Motor Movements are Fluid and Coordinated: No Coordination and Movement Description: 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 Motor  Motor Motor: Tetraplegia;Abnormal tone;Abnormal postural alignment and control Motor - Discharge Observations: tetraplegia  Mobility Bed Mobility Bed Mobility: Rolling Right;Rolling Left;Sit to Supine;Supine to Sit Rolling Right: Supervision/verbal cueing Rolling Left: Supervision/Verbal cueing Supine to Sit: Supervision/Verbal cueing Sit to Supine: Supervision/Verbal cueing Transfers Transfers: Lateral/Scoot Transfers Lateral/Scoot Transfers: Minimal Assistance - Patient > 75%;Contact Guard/Touching assist Transfer (Assistive device): Other (Comment)(slide board) Locomotion  Gait Ambulation: No Gait Gait: No Stairs / Additional Locomotion Stairs: No Ramp: Independent with assistive Production manager Mobility: Yes Wheelchair Assistance: Independent with Camera operator: Both upper extremities Wheelchair Parts Management: Independent Distance: 500  Trunk/Postural Assessment  Cervical Assessment Cervical Assessment: Exceptions to WFL(forward head; cervical precautions) Thoracic Assessment Thoracic Assessment: Exceptions to WFL(rounded shoulders and kyphotic; back precautions) Lumbar Assessment Lumbar Assessment: Exceptions to WFL(posterior pelvic tilt) Postural Control Postural Control: Deficits on evaluation(impaired, improved since eval)  Balance Balance Balance Assessed: Yes Static Sitting Balance Static Sitting - Balance Support: No upper extremity supported;Feet supported Static Sitting - Level of Assistance: 5: Stand by assistance Dynamic Sitting Balance Dynamic Sitting - Balance Support: No upper extremity supported;Feet supported;During functional activity Dynamic Sitting - Level of Assistance: 5: Stand by assistance;4: Min assist Extremity Assessment       RLE Assessment RLE Assessment: Exceptions to [facility] General Strength Comments: impaired, see below RLE Strength Right Hip Flexion: 2-/5 Right Knee Flexion: 2-/5 Right Knee Extension: 2-/5 Right Ankle Dorsiflexion: 2-/5 LLE Assessment LLE Assessment: Exceptions to [facility] General Strength Comments: impaired see below LLE Strength Left Hip Flexion: 1/5 Left Knee Flexion: 1/5 Left Knee Extension: 1/5 Left Ankle Dorsiflexion: 1/5     Excell Seltzer, PT, DPT 11/13/2019, 4:14 PM

## 2019-11-14 DIAGNOSIS — I480 Paroxysmal atrial fibrillation: Secondary | ICD-10-CM | POA: Diagnosis not present

## 2019-11-14 DIAGNOSIS — M4653 Other infective spondylopathies, cervicothoracic region: Secondary | ICD-10-CM | POA: Diagnosis not present

## 2019-11-14 DIAGNOSIS — G8220 Paraplegia, unspecified: Secondary | ICD-10-CM | POA: Diagnosis not present

## 2019-11-14 DIAGNOSIS — R7309 Other abnormal glucose: Secondary | ICD-10-CM | POA: Diagnosis not present

## 2019-11-14 DIAGNOSIS — D638 Anemia in other chronic diseases classified elsewhere: Secondary | ICD-10-CM | POA: Diagnosis not present

## 2019-11-14 DIAGNOSIS — G9529 Other cord compression: Secondary | ICD-10-CM | POA: Diagnosis not present

## 2019-11-14 DIAGNOSIS — B9689 Other specified bacterial agents as the cause of diseases classified elsewhere: Secondary | ICD-10-CM | POA: Diagnosis not present

## 2019-11-14 DIAGNOSIS — K592 Neurogenic bowel, not elsewhere classified: Secondary | ICD-10-CM | POA: Diagnosis not present

## 2019-11-14 DIAGNOSIS — Z4789 Encounter for other orthopedic aftercare: Secondary | ICD-10-CM | POA: Diagnosis not present

## 2019-11-14 DIAGNOSIS — Z452 Encounter for adjustment and management of vascular access device: Secondary | ICD-10-CM | POA: Diagnosis not present

## 2019-11-14 DIAGNOSIS — G062 Extradural and subdural abscess, unspecified: Secondary | ICD-10-CM | POA: Diagnosis not present

## 2019-11-14 DIAGNOSIS — N319 Neuromuscular dysfunction of bladder, unspecified: Secondary | ICD-10-CM | POA: Diagnosis not present

## 2019-11-14 DIAGNOSIS — L89153 Pressure ulcer of sacral region, stage 3: Secondary | ICD-10-CM | POA: Diagnosis not present

## 2019-11-14 NOTE — Progress Notes (Addendum)
Social Work Discharge Note   The overall goal for the admission was met for:   Discharge location: Yes - home with wife to provide 24/7 assistance  Length of Stay: Yes - 26 days  Discharge activity level: Yes - min/mod assist w/c level overall  Home/community participation: Yes  Services provided included: MD, RD, PT, OT, RN, TR, Pharmacy, [place]: Medicare  Follow-up services arranged: Home Health: RN, PT, OT via [place], DME: wheelchair, cushion, hospital bed, transfer board via [place] and Patient/Family has no preference for HH/DME agencies  Catheter supplies via [employer]  Comments (or additional information): Contact person, wife, Gedalia Mcmillon @ [phone] Patient/Family verbalized understanding of follow-up arrangements: Yes  Individual responsible for coordination of the follow-up plan: pt  Confirmed correct DME delivered: Ziaire Hagos 11/14/2019    Jacqeline Broers

## 2019-11-18 DIAGNOSIS — Z4789 Encounter for other orthopedic aftercare: Secondary | ICD-10-CM | POA: Diagnosis not present

## 2019-11-18 DIAGNOSIS — D638 Anemia in other chronic diseases classified elsewhere: Secondary | ICD-10-CM | POA: Diagnosis not present

## 2019-11-18 DIAGNOSIS — I480 Paroxysmal atrial fibrillation: Secondary | ICD-10-CM | POA: Diagnosis not present

## 2019-11-18 DIAGNOSIS — M4653 Other infective spondylopathies, cervicothoracic region: Secondary | ICD-10-CM | POA: Diagnosis not present

## 2019-11-18 DIAGNOSIS — G9529 Other cord compression: Secondary | ICD-10-CM | POA: Diagnosis not present

## 2019-11-18 DIAGNOSIS — Z5181 Encounter for therapeutic drug level monitoring: Secondary | ICD-10-CM | POA: Diagnosis not present

## 2019-11-18 DIAGNOSIS — K592 Neurogenic bowel, not elsewhere classified: Secondary | ICD-10-CM | POA: Diagnosis not present

## 2019-11-18 DIAGNOSIS — B9689 Other specified bacterial agents as the cause of diseases classified elsewhere: Secondary | ICD-10-CM | POA: Diagnosis not present

## 2019-11-18 DIAGNOSIS — L89153 Pressure ulcer of sacral region, stage 3: Secondary | ICD-10-CM | POA: Diagnosis not present

## 2019-11-18 DIAGNOSIS — R7309 Other abnormal glucose: Secondary | ICD-10-CM | POA: Diagnosis not present

## 2019-11-18 DIAGNOSIS — N319 Neuromuscular dysfunction of bladder, unspecified: Secondary | ICD-10-CM | POA: Diagnosis not present

## 2019-11-18 DIAGNOSIS — G062 Extradural and subdural abscess, unspecified: Secondary | ICD-10-CM | POA: Diagnosis not present

## 2019-11-18 DIAGNOSIS — G8220 Paraplegia, unspecified: Secondary | ICD-10-CM | POA: Diagnosis not present

## 2019-11-18 DIAGNOSIS — Z452 Encounter for adjustment and management of vascular access device: Secondary | ICD-10-CM | POA: Diagnosis not present

## 2019-11-19 ENCOUNTER — Telehealth: Payer: Self-pay | Admitting: Registered Nurse

## 2019-11-19 NOTE — Telephone Encounter (Signed)
Transitional Care call  Patient name: Ryan Reed  DOB: 05-15-1951 1. Are you/is patient experiencing any problems since coming home? No a. Are there any questions regarding any aspect of care? No 2. Are there any questions regarding medications administration/dosing? No a. Are meds being taken as prescribed? Yes b. "Patient should review meds with caller to confirm" Medication List Reviewed. 3. Have there been any falls? No 4. Has Home Health been to the house and/or have they contacted you? Yes, [place] a. If not, have you tried to contact them? NA b. Can we help you contact them? No 5. Are bowels and bladder emptying properly? On Bowel and Bladder Program. a. Are there any unexpected incontinence issues? See Above x1 at HS b. If applicable, is patient following bowel/bladder programs? Yes 6. Any fevers, problems with breathing, unexpected pain? No 7. Are there any skin problems or new areas of breakdown? No 8. Has the patient/family member arranged specialty MD follow up (ie cardiology/neurology/renal/surgical/etc.)?  HFU appointments are scheduled a. Can we help arrange? NA 9. Does the patient need any other services or support that we can help arrange? No 10. Are caregivers following through as expected in assisting the patient? Yes 11. Has the patient quit smoking, drinking alcohol, or using drugs as recommended? (                        )  Mrs. Reust asked if Mr. Pessolano could follow up with Dr. Dagoberto Ligas: Appointment was changed to Dr. Dagoberto Ligas, she verbalizes understanding.   Appointment date/time : December 06, 2019 at 1:00 with Dr. Tenny Craw. [place]

## 2019-11-21 DIAGNOSIS — B9689 Other specified bacterial agents as the cause of diseases classified elsewhere: Secondary | ICD-10-CM | POA: Diagnosis not present

## 2019-11-21 DIAGNOSIS — G8250 Quadriplegia, unspecified: Secondary | ICD-10-CM | POA: Diagnosis not present

## 2019-11-21 DIAGNOSIS — I480 Paroxysmal atrial fibrillation: Secondary | ICD-10-CM | POA: Diagnosis not present

## 2019-11-21 DIAGNOSIS — L89153 Pressure ulcer of sacral region, stage 3: Secondary | ICD-10-CM | POA: Diagnosis not present

## 2019-11-21 DIAGNOSIS — R7309 Other abnormal glucose: Secondary | ICD-10-CM | POA: Diagnosis not present

## 2019-11-21 DIAGNOSIS — G061 Intraspinal abscess and granuloma: Secondary | ICD-10-CM | POA: Diagnosis not present

## 2019-11-21 DIAGNOSIS — D638 Anemia in other chronic diseases classified elsewhere: Secondary | ICD-10-CM | POA: Diagnosis not present

## 2019-11-21 DIAGNOSIS — Z452 Encounter for adjustment and management of vascular access device: Secondary | ICD-10-CM | POA: Diagnosis not present

## 2019-11-21 DIAGNOSIS — Z4789 Encounter for other orthopedic aftercare: Secondary | ICD-10-CM | POA: Diagnosis not present

## 2019-11-21 DIAGNOSIS — G9529 Other cord compression: Secondary | ICD-10-CM | POA: Diagnosis not present

## 2019-11-21 DIAGNOSIS — G062 Extradural and subdural abscess, unspecified: Secondary | ICD-10-CM | POA: Diagnosis not present

## 2019-11-21 DIAGNOSIS — K592 Neurogenic bowel, not elsewhere classified: Secondary | ICD-10-CM | POA: Diagnosis not present

## 2019-11-21 DIAGNOSIS — M4653 Other infective spondylopathies, cervicothoracic region: Secondary | ICD-10-CM | POA: Diagnosis not present

## 2019-11-21 DIAGNOSIS — G8220 Paraplegia, unspecified: Secondary | ICD-10-CM | POA: Diagnosis not present

## 2019-11-21 DIAGNOSIS — N319 Neuromuscular dysfunction of bladder, unspecified: Secondary | ICD-10-CM | POA: Diagnosis not present

## 2019-11-22 DIAGNOSIS — B9689 Other specified bacterial agents as the cause of diseases classified elsewhere: Secondary | ICD-10-CM | POA: Diagnosis not present

## 2019-11-22 DIAGNOSIS — G061 Intraspinal abscess and granuloma: Secondary | ICD-10-CM | POA: Diagnosis not present

## 2019-11-22 DIAGNOSIS — G8250 Quadriplegia, unspecified: Secondary | ICD-10-CM | POA: Diagnosis not present

## 2019-11-25 ENCOUNTER — Telehealth: Payer: Self-pay

## 2019-11-25 DIAGNOSIS — K592 Neurogenic bowel, not elsewhere classified: Secondary | ICD-10-CM | POA: Diagnosis not present

## 2019-11-25 DIAGNOSIS — B9689 Other specified bacterial agents as the cause of diseases classified elsewhere: Secondary | ICD-10-CM | POA: Diagnosis not present

## 2019-11-25 DIAGNOSIS — M4653 Other infective spondylopathies, cervicothoracic region: Secondary | ICD-10-CM | POA: Diagnosis not present

## 2019-11-25 DIAGNOSIS — N319 Neuromuscular dysfunction of bladder, unspecified: Secondary | ICD-10-CM | POA: Diagnosis not present

## 2019-11-25 DIAGNOSIS — G8220 Paraplegia, unspecified: Secondary | ICD-10-CM | POA: Diagnosis not present

## 2019-11-25 DIAGNOSIS — I480 Paroxysmal atrial fibrillation: Secondary | ICD-10-CM | POA: Diagnosis not present

## 2019-11-25 DIAGNOSIS — Z452 Encounter for adjustment and management of vascular access device: Secondary | ICD-10-CM | POA: Diagnosis not present

## 2019-11-25 DIAGNOSIS — G062 Extradural and subdural abscess, unspecified: Secondary | ICD-10-CM | POA: Diagnosis not present

## 2019-11-25 DIAGNOSIS — D638 Anemia in other chronic diseases classified elsewhere: Secondary | ICD-10-CM | POA: Diagnosis not present

## 2019-11-25 DIAGNOSIS — G9529 Other cord compression: Secondary | ICD-10-CM | POA: Diagnosis not present

## 2019-11-25 DIAGNOSIS — Z791 Long term (current) use of non-steroidal anti-inflammatories (NSAID): Secondary | ICD-10-CM | POA: Diagnosis not present

## 2019-11-25 DIAGNOSIS — R7309 Other abnormal glucose: Secondary | ICD-10-CM | POA: Diagnosis not present

## 2019-11-25 DIAGNOSIS — Z4789 Encounter for other orthopedic aftercare: Secondary | ICD-10-CM | POA: Diagnosis not present

## 2019-11-25 DIAGNOSIS — L89153 Pressure ulcer of sacral region, stage 3: Secondary | ICD-10-CM | POA: Diagnosis not present

## 2019-11-25 NOTE — Telephone Encounter (Signed)
COVID-19 Pre-Screening Questions:11/25/2019  Do you currently have a fever (>100 F), chills or unexplained body aches? NO  Are you currently experiencing new cough, shortness of breath, sore throat, runny nose? NO  .  Have you recently travelled outside the state of [place] in the last 14 days? NO  .  Have you been in contact with someone that is currently pending confirmation of Covid19 testing or has been confirmed to have the [place] virus?  NO  **If the patient answers NO to ALL questions -  advise the patient to please call the clinic before coming to the office should any symptoms develop.

## 2019-11-26 ENCOUNTER — Other Ambulatory Visit: Payer: Self-pay

## 2019-11-26 ENCOUNTER — Encounter: Payer: Self-pay | Admitting: Internal Medicine

## 2019-11-26 ENCOUNTER — Encounter: Payer: Medicare Other | Admitting: Physical Medicine and Rehabilitation

## 2019-11-26 ENCOUNTER — Ambulatory Visit (INDEPENDENT_AMBULATORY_CARE_PROVIDER_SITE_OTHER): Payer: Medicare Other | Admitting: Internal Medicine

## 2019-11-26 DIAGNOSIS — G062 Extradural and subdural abscess, unspecified: Secondary | ICD-10-CM | POA: Diagnosis not present

## 2019-11-26 DIAGNOSIS — G061 Intraspinal abscess and granuloma: Secondary | ICD-10-CM | POA: Diagnosis not present

## 2019-11-26 DIAGNOSIS — B9689 Other specified bacterial agents as the cause of diseases classified elsewhere: Secondary | ICD-10-CM | POA: Diagnosis not present

## 2019-11-26 DIAGNOSIS — G8250 Quadriplegia, unspecified: Secondary | ICD-10-CM | POA: Diagnosis not present

## 2019-11-26 DIAGNOSIS — A491 Streptococcal infection, unspecified site: Secondary | ICD-10-CM | POA: Diagnosis not present

## 2019-11-26 NOTE — Progress Notes (Signed)
RFV: follow up for epidural abscess  Patient ID: Ryan Reed, male   DOB: [birth year], 69 y.o.   MRN: [id number]  HPI Ryan Reed, [name] with epidural abscess c/b partial quadraplegia. Saw dr Ronnald Ramp who gave him new muscle relaxant for muscle spasm in left leg. Has some movement with legs, can move right leg greater than left. Still getting pt at home as well as ot at home.  Continues to get weekly dressing changes on Monday. Scheduled to have IV abtx until 18th.   [phone]  Outpatient Encounter Medications as of 11/26/2019  Medication Sig  . acetaminophen (TYLENOL) 325 MG tablet Take 1-2 tablets (325-650 mg total) by mouth every 4 (four) hours as needed for mild pain.  Marland Kitchen apixaban (ELIQUIS) 5 MG TABS tablet Take 1 tablet (5 mg total) by mouth 2 (two) times daily.  . bisacodyl (DULCOLAX) 10 MG suppository Place 1 suppository (10 mg total) rectally daily after supper.  . cefTRIAXone (ROCEPHIN) IVPB Inject 2 g into the vein every 12 (twelve) hours. Indication:  Epidural abscess with gemella species  Last Day of Therapy:  12/09/2019 Labs - Once weekly:  CBC/D and BMP, Labs - Every other week:  ESR and CRP  . collagenase (SANTYL) ointment Apply topically 2 (two) times daily. Apply to affected area  . ferrous sulfate 325 (65 FE) MG tablet Take 1 tablet (325 mg total) by mouth 2 (two) times daily with a meal.  . metoprolol tartrate (LOPRESSOR) 25 MG tablet Take 0.5 tablets (12.5 mg total) by mouth 2 (two) times daily.  . Multiple Vitamin (MULTIVITAMIN WITH MINERALS) TABS tablet Take 1 tablet by mouth daily.  . polyethylene glycol (MIRALAX / GLYCOLAX) 17 g packet Take 17 g by mouth daily as needed for mild constipation.  . senna (SENOKOT) 8.6 MG TABS tablet Take 2 tablets (17.2 mg total) by mouth daily.  Marland Kitchen tiZANidine (ZANAFLEX) 4 MG capsule Take 4 mg by mouth 3 (three) times daily.  . traMADol (ULTRAM) 50 MG tablet Take by mouth every 6 (six) hours as needed.  . traZODone (DESYREL) 50 MG  tablet Take 0.5-1 tablets (25-50 mg total) by mouth at bedtime as needed for sleep.  . cyclobenzaprine (FLEXERIL) 5 MG tablet Take 1 tablet (5 mg total) by mouth 3 (three) times daily as needed for muscle spasms. (Patient not taking: Reported on 11/26/2019)   No facility-administered encounter medications on file as of 11/26/2019.     Patient Active Problem List   Diagnosis Date Noted  . Pressure injury of skin 11/11/2019  . Anemia   . Constipation   . Neurogenic bladder   . Neurogenic bowel   . Quadriplegia ([place]) 10/19/2019  . 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 Epidural abscess 10/14/2019     Health Maintenance Due  Topic Date Due  . Hepatitis C Screening  January 26, 1951  . TETANUS/TDAP  01/15/1971  . COLONOSCOPY  01/15/2002  . INFLUENZA VACCINE  06/22/2019    Soc hx: no smokng or etoh use  Review of Systems 12 point ros is reviewed, positive pertinents listed above Physical Exam   There were no vitals taken for this visit.  Physical Exam  Constitutional: He is oriented to person, place, and time. He appears well-developed and well-nourished. No distress.  HENT:  Mouth/Throat: Oropharynx is clear and moist. No oropharyngeal exudate.  Cardiovascular: Normal rate, regular rhythm and normal heart sounds. Exam reveals no gallop and no friction rub.  No murmur heard.  Pulmonary/Chest:  Effort normal and breath sounds normal. No respiratory distress. He has no wheezes.  Abdominal: Soft. Bowel sounds are normal. He exhibits no distension. There is no tenderness.  Lymphadenopathy:  He has no cervical adenopathy.  Neurological: He is alert and oriented to person, place, and time. Decreased motor to lower extremities Skin: Skin is warm and dry. No rash noted. No erythema.  Psychiatric: He has a normal mood and affect. His behavior is normal.    CBC Lab Results  Component Value Date   WBC 5.7 11/11/2019   RBC 3.51 (L) 11/11/2019   HGB  9.9 (L) 11/11/2019   HCT 31.3 (L) 11/11/2019   PLT 314 11/11/2019   MCV 89.2 11/11/2019   MCH 28.2 11/11/2019   MCHC 31.6 11/11/2019   RDW 13.1 11/11/2019   LYMPHSABS 1.3 11/11/2019   MONOABS 0.5 11/11/2019   EOSABS 0.3 11/11/2019    BMET Lab Results  Component Value Date   NA 137 11/11/2019   K 3.6 11/11/2019   CL 103 11/11/2019   CO2 26 11/11/2019   GLUCOSE 116 (H) 11/11/2019   BUN 15 11/11/2019   CREATININE 0.66 11/11/2019   CALCIUM 8.6 (L) 11/11/2019   GFRNONAA >60 11/11/2019   GFRAA >60 11/11/2019      Assessment and Plan  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 He will need to be on suppression thereafter, likely will need chronic suppression for 3 months.  Will send in amox or keflex to start 1/19  Needs dental approval

## 2019-11-27 DIAGNOSIS — B9689 Other specified bacterial agents as the cause of diseases classified elsewhere: Secondary | ICD-10-CM | POA: Diagnosis not present

## 2019-11-27 DIAGNOSIS — M4653 Other infective spondylopathies, cervicothoracic region: Secondary | ICD-10-CM | POA: Diagnosis not present

## 2019-11-27 DIAGNOSIS — Z4789 Encounter for other orthopedic aftercare: Secondary | ICD-10-CM | POA: Diagnosis not present

## 2019-11-27 DIAGNOSIS — G8220 Paraplegia, unspecified: Secondary | ICD-10-CM | POA: Diagnosis not present

## 2019-11-27 DIAGNOSIS — R7309 Other abnormal glucose: Secondary | ICD-10-CM | POA: Diagnosis not present

## 2019-11-27 DIAGNOSIS — G062 Extradural and subdural abscess, unspecified: Secondary | ICD-10-CM | POA: Diagnosis not present

## 2019-11-27 DIAGNOSIS — G9529 Other cord compression: Secondary | ICD-10-CM | POA: Diagnosis not present

## 2019-11-27 DIAGNOSIS — N319 Neuromuscular dysfunction of bladder, unspecified: Secondary | ICD-10-CM | POA: Diagnosis not present

## 2019-11-27 DIAGNOSIS — K592 Neurogenic bowel, not elsewhere classified: Secondary | ICD-10-CM | POA: Diagnosis not present

## 2019-11-27 DIAGNOSIS — D638 Anemia in other chronic diseases classified elsewhere: Secondary | ICD-10-CM | POA: Diagnosis not present

## 2019-11-27 DIAGNOSIS — I480 Paroxysmal atrial fibrillation: Secondary | ICD-10-CM | POA: Diagnosis not present

## 2019-11-27 DIAGNOSIS — L89153 Pressure ulcer of sacral region, stage 3: Secondary | ICD-10-CM | POA: Diagnosis not present

## 2019-11-27 DIAGNOSIS — Z452 Encounter for adjustment and management of vascular access device: Secondary | ICD-10-CM | POA: Diagnosis not present

## 2019-11-29 DIAGNOSIS — R7309 Other abnormal glucose: Secondary | ICD-10-CM | POA: Diagnosis not present

## 2019-11-29 DIAGNOSIS — Z452 Encounter for adjustment and management of vascular access device: Secondary | ICD-10-CM | POA: Diagnosis not present

## 2019-11-29 DIAGNOSIS — G062 Extradural and subdural abscess, unspecified: Secondary | ICD-10-CM | POA: Diagnosis not present

## 2019-11-29 DIAGNOSIS — B9689 Other specified bacterial agents as the cause of diseases classified elsewhere: Secondary | ICD-10-CM | POA: Diagnosis not present

## 2019-11-29 DIAGNOSIS — N319 Neuromuscular dysfunction of bladder, unspecified: Secondary | ICD-10-CM | POA: Diagnosis not present

## 2019-11-29 DIAGNOSIS — G8220 Paraplegia, unspecified: Secondary | ICD-10-CM | POA: Diagnosis not present

## 2019-11-29 DIAGNOSIS — I480 Paroxysmal atrial fibrillation: Secondary | ICD-10-CM | POA: Diagnosis not present

## 2019-11-29 DIAGNOSIS — Z4789 Encounter for other orthopedic aftercare: Secondary | ICD-10-CM | POA: Diagnosis not present

## 2019-11-29 DIAGNOSIS — K592 Neurogenic bowel, not elsewhere classified: Secondary | ICD-10-CM | POA: Diagnosis not present

## 2019-11-29 DIAGNOSIS — D638 Anemia in other chronic diseases classified elsewhere: Secondary | ICD-10-CM | POA: Diagnosis not present

## 2019-11-29 DIAGNOSIS — G9529 Other cord compression: Secondary | ICD-10-CM | POA: Diagnosis not present

## 2019-11-29 DIAGNOSIS — L89153 Pressure ulcer of sacral region, stage 3: Secondary | ICD-10-CM | POA: Diagnosis not present

## 2019-11-29 DIAGNOSIS — M4653 Other infective spondylopathies, cervicothoracic region: Secondary | ICD-10-CM | POA: Diagnosis not present

## 2019-12-02 DIAGNOSIS — Z4789 Encounter for other orthopedic aftercare: Secondary | ICD-10-CM | POA: Diagnosis not present

## 2019-12-02 DIAGNOSIS — G904 Autonomic dysreflexia: Secondary | ICD-10-CM | POA: Diagnosis not present

## 2019-12-02 DIAGNOSIS — D638 Anemia in other chronic diseases classified elsewhere: Secondary | ICD-10-CM | POA: Diagnosis not present

## 2019-12-02 DIAGNOSIS — G062 Extradural and subdural abscess, unspecified: Secondary | ICD-10-CM | POA: Diagnosis not present

## 2019-12-02 DIAGNOSIS — I480 Paroxysmal atrial fibrillation: Secondary | ICD-10-CM | POA: Diagnosis not present

## 2019-12-02 DIAGNOSIS — G8220 Paraplegia, unspecified: Secondary | ICD-10-CM | POA: Diagnosis not present

## 2019-12-02 DIAGNOSIS — N319 Neuromuscular dysfunction of bladder, unspecified: Secondary | ICD-10-CM | POA: Diagnosis not present

## 2019-12-02 DIAGNOSIS — L89153 Pressure ulcer of sacral region, stage 3: Secondary | ICD-10-CM | POA: Diagnosis not present

## 2019-12-02 DIAGNOSIS — Z09 Encounter for follow-up examination after completed treatment for conditions other than malignant neoplasm: Secondary | ICD-10-CM | POA: Diagnosis not present

## 2019-12-02 DIAGNOSIS — B9689 Other specified bacterial agents as the cause of diseases classified elsewhere: Secondary | ICD-10-CM | POA: Diagnosis not present

## 2019-12-02 DIAGNOSIS — Z452 Encounter for adjustment and management of vascular access device: Secondary | ICD-10-CM | POA: Diagnosis not present

## 2019-12-02 DIAGNOSIS — M4653 Other infective spondylopathies, cervicothoracic region: Secondary | ICD-10-CM | POA: Diagnosis not present

## 2019-12-02 DIAGNOSIS — Z5181 Encounter for therapeutic drug level monitoring: Secondary | ICD-10-CM | POA: Diagnosis not present

## 2019-12-02 DIAGNOSIS — G9529 Other cord compression: Secondary | ICD-10-CM | POA: Diagnosis not present

## 2019-12-02 DIAGNOSIS — K592 Neurogenic bowel, not elsewhere classified: Secondary | ICD-10-CM | POA: Diagnosis not present

## 2019-12-02 DIAGNOSIS — R7309 Other abnormal glucose: Secondary | ICD-10-CM | POA: Diagnosis not present

## 2019-12-02 DIAGNOSIS — G061 Intraspinal abscess and granuloma: Secondary | ICD-10-CM | POA: Diagnosis not present

## 2019-12-03 DIAGNOSIS — G9529 Other cord compression: Secondary | ICD-10-CM | POA: Diagnosis not present

## 2019-12-03 DIAGNOSIS — L89153 Pressure ulcer of sacral region, stage 3: Secondary | ICD-10-CM | POA: Diagnosis not present

## 2019-12-03 DIAGNOSIS — G8220 Paraplegia, unspecified: Secondary | ICD-10-CM | POA: Diagnosis not present

## 2019-12-03 DIAGNOSIS — Z452 Encounter for adjustment and management of vascular access device: Secondary | ICD-10-CM | POA: Diagnosis not present

## 2019-12-03 DIAGNOSIS — G8250 Quadriplegia, unspecified: Secondary | ICD-10-CM | POA: Diagnosis not present

## 2019-12-03 DIAGNOSIS — G062 Extradural and subdural abscess, unspecified: Secondary | ICD-10-CM | POA: Diagnosis not present

## 2019-12-03 DIAGNOSIS — N319 Neuromuscular dysfunction of bladder, unspecified: Secondary | ICD-10-CM | POA: Diagnosis not present

## 2019-12-03 DIAGNOSIS — D638 Anemia in other chronic diseases classified elsewhere: Secondary | ICD-10-CM | POA: Diagnosis not present

## 2019-12-03 DIAGNOSIS — B9689 Other specified bacterial agents as the cause of diseases classified elsewhere: Secondary | ICD-10-CM | POA: Diagnosis not present

## 2019-12-03 DIAGNOSIS — I480 Paroxysmal atrial fibrillation: Secondary | ICD-10-CM | POA: Diagnosis not present

## 2019-12-03 DIAGNOSIS — K592 Neurogenic bowel, not elsewhere classified: Secondary | ICD-10-CM | POA: Diagnosis not present

## 2019-12-03 DIAGNOSIS — R7309 Other abnormal glucose: Secondary | ICD-10-CM | POA: Diagnosis not present

## 2019-12-03 DIAGNOSIS — M4653 Other infective spondylopathies, cervicothoracic region: Secondary | ICD-10-CM | POA: Diagnosis not present

## 2019-12-03 DIAGNOSIS — Z4789 Encounter for other orthopedic aftercare: Secondary | ICD-10-CM | POA: Diagnosis not present

## 2019-12-05 DIAGNOSIS — N319 Neuromuscular dysfunction of bladder, unspecified: Secondary | ICD-10-CM | POA: Diagnosis not present

## 2019-12-05 DIAGNOSIS — M4653 Other infective spondylopathies, cervicothoracic region: Secondary | ICD-10-CM | POA: Diagnosis not present

## 2019-12-05 DIAGNOSIS — R7309 Other abnormal glucose: Secondary | ICD-10-CM | POA: Diagnosis not present

## 2019-12-05 DIAGNOSIS — Z452 Encounter for adjustment and management of vascular access device: Secondary | ICD-10-CM | POA: Diagnosis not present

## 2019-12-05 DIAGNOSIS — B9689 Other specified bacterial agents as the cause of diseases classified elsewhere: Secondary | ICD-10-CM | POA: Diagnosis not present

## 2019-12-05 DIAGNOSIS — D638 Anemia in other chronic diseases classified elsewhere: Secondary | ICD-10-CM | POA: Diagnosis not present

## 2019-12-05 DIAGNOSIS — G8250 Quadriplegia, unspecified: Secondary | ICD-10-CM | POA: Diagnosis not present

## 2019-12-05 DIAGNOSIS — G062 Extradural and subdural abscess, unspecified: Secondary | ICD-10-CM | POA: Diagnosis not present

## 2019-12-05 DIAGNOSIS — K592 Neurogenic bowel, not elsewhere classified: Secondary | ICD-10-CM | POA: Diagnosis not present

## 2019-12-05 DIAGNOSIS — L89153 Pressure ulcer of sacral region, stage 3: Secondary | ICD-10-CM | POA: Diagnosis not present

## 2019-12-05 DIAGNOSIS — G9529 Other cord compression: Secondary | ICD-10-CM | POA: Diagnosis not present

## 2019-12-05 DIAGNOSIS — Z4789 Encounter for other orthopedic aftercare: Secondary | ICD-10-CM | POA: Diagnosis not present

## 2019-12-05 DIAGNOSIS — G8220 Paraplegia, unspecified: Secondary | ICD-10-CM | POA: Diagnosis not present

## 2019-12-05 DIAGNOSIS — I480 Paroxysmal atrial fibrillation: Secondary | ICD-10-CM | POA: Diagnosis not present

## 2019-12-05 DIAGNOSIS — G061 Intraspinal abscess and granuloma: Secondary | ICD-10-CM | POA: Diagnosis not present

## 2019-12-06 ENCOUNTER — Encounter
Payer: Medicare Other | Attending: Physical Medicine and Rehabilitation | Admitting: Physical Medicine and Rehabilitation

## 2019-12-06 ENCOUNTER — Encounter: Payer: Self-pay | Admitting: Physical Medicine and Rehabilitation

## 2019-12-06 ENCOUNTER — Other Ambulatory Visit: Payer: Self-pay

## 2019-12-06 VITALS — BP 94/61 | HR 63 | Temp 97.5°F | Ht 74.0 in

## 2019-12-06 DIAGNOSIS — K5900 Constipation, unspecified: Secondary | ICD-10-CM | POA: Diagnosis not present

## 2019-12-06 DIAGNOSIS — K592 Neurogenic bowel, not elsewhere classified: Secondary | ICD-10-CM | POA: Diagnosis not present

## 2019-12-06 DIAGNOSIS — B9689 Other specified bacterial agents as the cause of diseases classified elsewhere: Secondary | ICD-10-CM

## 2019-12-06 DIAGNOSIS — G8250 Quadriplegia, unspecified: Secondary | ICD-10-CM | POA: Diagnosis not present

## 2019-12-06 DIAGNOSIS — N319 Neuromuscular dysfunction of bladder, unspecified: Secondary | ICD-10-CM | POA: Diagnosis not present

## 2019-12-06 DIAGNOSIS — G061 Intraspinal abscess and granuloma: Secondary | ICD-10-CM | POA: Insufficient documentation

## 2019-12-06 DIAGNOSIS — L89309 Pressure ulcer of unspecified buttock, unspecified stage: Secondary | ICD-10-CM

## 2019-12-06 DIAGNOSIS — D649 Anemia, unspecified: Secondary | ICD-10-CM | POA: Diagnosis not present

## 2019-12-06 NOTE — Progress Notes (Signed)
Subjective:    Patient ID: Ryan Reed, male    DOB: [birth year], 69 y.o.   MRN: [id number]  HPI  Ryan Reed presents for transitional care follow-up after CIR admission for epidural abscess.  He has been doing very well at home, receiving PT and OT, and doing his exercises daily. He has a goal to stand next week!  His pressure ulcers have been healing well.   He does not like his current wheelchair and will be getting a fitted one within the next months.  He has bilateral hip pain--feels like a dull ache. He has been taking Tramadol at night as needed. He usually takes 1-2 Tizanidine during the day.   His wife believes that he may need three other refills but she is not sure of which medications.  Constipation: He was on a good regimen, but stopped taking his nightly suppository and is now no longer having daily BM. He continues to take daily Senna and Miralax. He plans to restart taking his nightly suppository.   He is independent in catheterization.   He asks whether he should take CBD oil, which was recommended by his friend for nerve degeneration.  He asks whether he should continue taking his iron supplement.   Pain Inventory Average Pain 4 Pain Right Now 3 My pain is intermittent, dull and aching  In the last 24 hours, has pain interfered with the following? General activity 0 Relation with others 0 Enjoyment of life 2 What TIME of day is your pain at its worst? evening Sleep (in general) Fair  Pain is worse with: sitting and some activites Pain improves with: rest and medication Relief from Meds: 6  Mobility ability to climb steps?  no do you drive?  no use a wheelchair  Function not employed: date last employed . disabled: date disabled . I need assistance with the following:  toileting, meal prep, household duties and shopping  Neuro/Psych bladder control problems  Prior Studies Any changes since last visit?  no  Physicians involved in your  care Any changes since last visit?  no   Family History  Problem Relation Age of Onset  . Heart disease Father   . Cardiomyopathy Sister    Social History   Socioeconomic History  . Marital status: Married    Spouse name: Not on file  . Number of children: Not on file  . Years of education: Not on file  . Highest education level: Not on file  Occupational History  . Not on file  Tobacco Use  . Smoking status: Former Smoker    Types: Cigarettes    Quit date: 11/21/1988    Years since quitting: 31.0  . Smokeless tobacco: Never Used  Substance and Sexual Activity  . Alcohol use: Yes    Alcohol/week: 12.0 - 14.0 standard drinks    Types: 12 - 14 Cans of beer per week  . Drug use: Yes  . Sexual activity: Not on file  Other Topics Concern  . Not on file  Social History Narrative  . Not on file   Social Determinants of Health   Financial Resource Strain:   . Difficulty of Paying Living Expenses: Not on file  Food Insecurity:   . Worried About Charity fundraiser in the Last Year: Not on file  . Ran Out of Food in the Last Year: Not on file  Transportation Needs:   . Lack of Transportation (Medical): Not on file  . Lack of Transportation (  Non-Medical): Not on file  Physical Activity:   . Days of Exercise per Week: Not on file  . Minutes of Exercise per Session: Not on file  Stress:   . Feeling of Stress : Not on file  Social Connections:   . Frequency of Communication with Friends and Family: Not on file  . Frequency of Social Gatherings with Friends and Family: Not on file  . Attends Religious Services: Not on file  . Active Member of Clubs or Organizations: Not on file  . Attends Archivist Meetings: Not on file  . Marital Status: Not on file   Past Surgical History:  Procedure Laterality Date  . LACERATION REPAIR Left    left thigh  . THORACIC LAMINECTOMY FOR EPIDURAL ABSCESS N/A 10/14/2019   Procedure: Cervical seven - THORACIC seven LAMINECTOMIES  FOR EPIDURAL ABSCESS;  Surgeon: [name], MD;  Location: [facility];  Service: Neurosurgery;  Laterality: N/A;   No past medical history on file. BP 94/61   Pulse 63   Temp (!) 97.5 F (36.4 C)   Ht 6\' 2"  (1.88 m)   SpO2 98%   BMI 25.36 kg/m   Opioid Risk Score:   Fall Risk Score:  `1  Depression screen PHQ 2/9  Depression screen PHQ 2/9 11/26/2019  Decreased Interest 0  Down, Depressed, Hopeless 1  PHQ - 2 Score 1     Review of Systems  Constitutional: Positive for diaphoresis.  HENT: Negative.   Eyes: Negative.   Respiratory: Negative.   Cardiovascular: Negative.   Gastrointestinal: Negative.   Endocrine: Negative.   Genitourinary: Negative.   Musculoskeletal: Positive for arthralgias and gait problem.  Skin: Negative.   Allergic/Immunologic: Negative.   Hematological: Negative.   Psychiatric/Behavioral: Negative.   All other systems reviewed and are negative.      Objective:   Physical Exam Constitutional: No distress . Vital signs and labs and nursing notes reviewed. Laying on side, L side in bed to get off backside, NAD HENT: Normocephalic.  Atraumatic. Eyes: EOMI. No discharge. Cardiovascular: No JVD. Respiratory: Normal effort.  No stridor. GI: Non-distended. Skin: Warm and dry.  Buttocks- Stage II- macerated/white edges- 2 small spots seen- but moist/wet appearing- otherwise looks superficial/good. Marland Kitchen Psych: Normal mood.  Normal behavior. Musc: No edema in extremities.  No tenderness in extremities. Neurological:  Alert Motor: Bilateral upper extremities: 5/5 proximal distal Right lower extremity: 4/5 proximal distally  Left lower extremity: Hip flexion 1/5, 1/5 knee extension 1/5, ankle dorsiflexion 0/5, wiggles toes, improving       Assessment & Plan:  C7 Quadriplegia ASIA B and functional deficitssecondary to C7-T1 epidural abscess with associated paraplegia             Continue Home PT/OT 2. Antithrombotics: -DVT/anticoagulation-bilateral  gastrocs:Eliquis BID. He asked whether legs may be massaged--I advised against this until resolutions of clots are confirmed, likely 6 months out from diagnosis.              -antiplatelet therapy: N/A 3. Pain Management:  Has bilateral hip pain, dull, likely due to constant pressure from wheelchair. Recommend bilateral lidocaine patches and he is agreeable. He already has voltaren gel.  Since he is only taking Tramadol at night to help him sleep pain-free, I advised him to replace this with Tizanidine, which will make him drowsy, and allow him to wean off opioid.  4. Mood:LCSW to follow for evaluation and support -antipsychotic agents: N/A 5. Neuropsych: This patientiscapable of making decisions onhisown behalf.  6. Skin/Wound Care:Routine pressure relief measureswith local care to sacrum.              Pressure relief with every 2 hour turns 7. Fluids/Electrolytes/Nutrition:Has good appetite.  8.Acute blood loss anemia:              hemoglobin 11.4 on nurse draw at home. Advised that he continue with the iron given that this is still below normal.  9.PAF: Continue Metoprolol 12.5 twice daily  10. Neurogenic bladder:                    doing well with own caths 11.Neurogenic bowel:              Continue senna and miralax and add back nightly suppository.  12. Epidural abscess- on IV Rocephin x 8 weeks:  13. Hyperglycemia: Stress Induced hyperglycemia  14.  Normochromic normocytic anemia             Continue iron supplement 15. 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  18. Insomnia: can continue melatonin. No longer taking tramadol (removed from list).    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 He has not yet received chair.   60 minutes of face to face patient care time were spent during this visit. Greater than 50% of time was spent in care and coordination of care with patient. All questions were encouraged and answered. Follow up with me in 3 months.

## 2019-12-09 ENCOUNTER — Telehealth: Payer: Self-pay | Admitting: *Deleted

## 2019-12-09 DIAGNOSIS — G8220 Paraplegia, unspecified: Secondary | ICD-10-CM | POA: Diagnosis not present

## 2019-12-09 DIAGNOSIS — R7309 Other abnormal glucose: Secondary | ICD-10-CM | POA: Diagnosis not present

## 2019-12-09 DIAGNOSIS — I480 Paroxysmal atrial fibrillation: Secondary | ICD-10-CM | POA: Diagnosis not present

## 2019-12-09 DIAGNOSIS — Z5181 Encounter for therapeutic drug level monitoring: Secondary | ICD-10-CM | POA: Diagnosis not present

## 2019-12-09 DIAGNOSIS — N319 Neuromuscular dysfunction of bladder, unspecified: Secondary | ICD-10-CM | POA: Diagnosis not present

## 2019-12-09 DIAGNOSIS — D638 Anemia in other chronic diseases classified elsewhere: Secondary | ICD-10-CM | POA: Diagnosis not present

## 2019-12-09 DIAGNOSIS — L89153 Pressure ulcer of sacral region, stage 3: Secondary | ICD-10-CM | POA: Diagnosis not present

## 2019-12-09 DIAGNOSIS — K592 Neurogenic bowel, not elsewhere classified: Secondary | ICD-10-CM | POA: Diagnosis not present

## 2019-12-09 DIAGNOSIS — G061 Intraspinal abscess and granuloma: Secondary | ICD-10-CM

## 2019-12-09 DIAGNOSIS — B9689 Other specified bacterial agents as the cause of diseases classified elsewhere: Secondary | ICD-10-CM | POA: Diagnosis not present

## 2019-12-09 DIAGNOSIS — G062 Extradural and subdural abscess, unspecified: Secondary | ICD-10-CM | POA: Diagnosis not present

## 2019-12-09 DIAGNOSIS — Z4789 Encounter for other orthopedic aftercare: Secondary | ICD-10-CM | POA: Diagnosis not present

## 2019-12-09 DIAGNOSIS — M4653 Other infective spondylopathies, cervicothoracic region: Secondary | ICD-10-CM | POA: Diagnosis not present

## 2019-12-09 DIAGNOSIS — G9529 Other cord compression: Secondary | ICD-10-CM | POA: Diagnosis not present

## 2019-12-09 DIAGNOSIS — Z452 Encounter for adjustment and management of vascular access device: Secondary | ICD-10-CM | POA: Diagnosis not present

## 2019-12-09 MED ORDER — AMOXICILLIN 500 MG PO CAPS: 500.0000 mg | ORAL_CAPSULE | Freq: Three times a day (TID) | ORAL | 3 refills | Status: DC

## 2019-12-09 NOTE — Telephone Encounter (Signed)
Ryan Reed at [facility] called to confirm end of treatment 1/18, transition to oral antibiotics 1/19, and ok to pull PICC at end of treatment. Confirmed per verbal order from Dr Baxter Flattery. Ryan Gandy, RN

## 2019-12-09 NOTE — Addendum Note (Signed)
Addended by: Landis Gandy on: 12/09/2019 05:32 PM   Modules accepted: Orders

## 2019-12-10 DIAGNOSIS — G062 Extradural and subdural abscess, unspecified: Secondary | ICD-10-CM | POA: Diagnosis not present

## 2019-12-10 DIAGNOSIS — K592 Neurogenic bowel, not elsewhere classified: Secondary | ICD-10-CM | POA: Diagnosis not present

## 2019-12-10 DIAGNOSIS — Z452 Encounter for adjustment and management of vascular access device: Secondary | ICD-10-CM | POA: Diagnosis not present

## 2019-12-10 DIAGNOSIS — L89153 Pressure ulcer of sacral region, stage 3: Secondary | ICD-10-CM | POA: Diagnosis not present

## 2019-12-10 DIAGNOSIS — B9689 Other specified bacterial agents as the cause of diseases classified elsewhere: Secondary | ICD-10-CM | POA: Diagnosis not present

## 2019-12-10 DIAGNOSIS — N319 Neuromuscular dysfunction of bladder, unspecified: Secondary | ICD-10-CM | POA: Diagnosis not present

## 2019-12-10 DIAGNOSIS — D638 Anemia in other chronic diseases classified elsewhere: Secondary | ICD-10-CM | POA: Diagnosis not present

## 2019-12-10 DIAGNOSIS — G9529 Other cord compression: Secondary | ICD-10-CM | POA: Diagnosis not present

## 2019-12-10 DIAGNOSIS — I480 Paroxysmal atrial fibrillation: Secondary | ICD-10-CM | POA: Diagnosis not present

## 2019-12-10 DIAGNOSIS — G8220 Paraplegia, unspecified: Secondary | ICD-10-CM | POA: Diagnosis not present

## 2019-12-10 DIAGNOSIS — R7309 Other abnormal glucose: Secondary | ICD-10-CM | POA: Diagnosis not present

## 2019-12-10 DIAGNOSIS — Z4789 Encounter for other orthopedic aftercare: Secondary | ICD-10-CM | POA: Diagnosis not present

## 2019-12-10 DIAGNOSIS — M4653 Other infective spondylopathies, cervicothoracic region: Secondary | ICD-10-CM | POA: Diagnosis not present

## 2019-12-11 DIAGNOSIS — L89153 Pressure ulcer of sacral region, stage 3: Secondary | ICD-10-CM | POA: Diagnosis not present

## 2019-12-11 DIAGNOSIS — G062 Extradural and subdural abscess, unspecified: Secondary | ICD-10-CM | POA: Diagnosis not present

## 2019-12-11 DIAGNOSIS — N319 Neuromuscular dysfunction of bladder, unspecified: Secondary | ICD-10-CM | POA: Diagnosis not present

## 2019-12-11 DIAGNOSIS — Z4789 Encounter for other orthopedic aftercare: Secondary | ICD-10-CM | POA: Diagnosis not present

## 2019-12-11 DIAGNOSIS — R7309 Other abnormal glucose: Secondary | ICD-10-CM | POA: Diagnosis not present

## 2019-12-11 DIAGNOSIS — G9529 Other cord compression: Secondary | ICD-10-CM | POA: Diagnosis not present

## 2019-12-11 DIAGNOSIS — I480 Paroxysmal atrial fibrillation: Secondary | ICD-10-CM | POA: Diagnosis not present

## 2019-12-11 DIAGNOSIS — K592 Neurogenic bowel, not elsewhere classified: Secondary | ICD-10-CM | POA: Diagnosis not present

## 2019-12-11 DIAGNOSIS — D638 Anemia in other chronic diseases classified elsewhere: Secondary | ICD-10-CM | POA: Diagnosis not present

## 2019-12-11 DIAGNOSIS — B9689 Other specified bacterial agents as the cause of diseases classified elsewhere: Secondary | ICD-10-CM | POA: Diagnosis not present

## 2019-12-11 DIAGNOSIS — M4653 Other infective spondylopathies, cervicothoracic region: Secondary | ICD-10-CM | POA: Diagnosis not present

## 2019-12-11 DIAGNOSIS — Z452 Encounter for adjustment and management of vascular access device: Secondary | ICD-10-CM | POA: Diagnosis not present

## 2019-12-11 DIAGNOSIS — G8220 Paraplegia, unspecified: Secondary | ICD-10-CM | POA: Diagnosis not present

## 2019-12-12 DIAGNOSIS — N130 Hydronephrosis with ureteropelvic junction obstruction: Secondary | ICD-10-CM | POA: Diagnosis not present

## 2019-12-12 DIAGNOSIS — R3914 Feeling of incomplete bladder emptying: Secondary | ICD-10-CM | POA: Diagnosis not present

## 2019-12-13 DIAGNOSIS — I480 Paroxysmal atrial fibrillation: Secondary | ICD-10-CM | POA: Diagnosis not present

## 2019-12-13 DIAGNOSIS — K592 Neurogenic bowel, not elsewhere classified: Secondary | ICD-10-CM | POA: Diagnosis not present

## 2019-12-13 DIAGNOSIS — G062 Extradural and subdural abscess, unspecified: Secondary | ICD-10-CM | POA: Diagnosis not present

## 2019-12-13 DIAGNOSIS — D638 Anemia in other chronic diseases classified elsewhere: Secondary | ICD-10-CM | POA: Diagnosis not present

## 2019-12-13 DIAGNOSIS — Z452 Encounter for adjustment and management of vascular access device: Secondary | ICD-10-CM | POA: Diagnosis not present

## 2019-12-13 DIAGNOSIS — G8220 Paraplegia, unspecified: Secondary | ICD-10-CM | POA: Diagnosis not present

## 2019-12-13 DIAGNOSIS — L89153 Pressure ulcer of sacral region, stage 3: Secondary | ICD-10-CM | POA: Diagnosis not present

## 2019-12-13 DIAGNOSIS — Z4789 Encounter for other orthopedic aftercare: Secondary | ICD-10-CM | POA: Diagnosis not present

## 2019-12-13 DIAGNOSIS — M4653 Other infective spondylopathies, cervicothoracic region: Secondary | ICD-10-CM | POA: Diagnosis not present

## 2019-12-13 DIAGNOSIS — G8250 Quadriplegia, unspecified: Secondary | ICD-10-CM | POA: Diagnosis not present

## 2019-12-13 DIAGNOSIS — R7309 Other abnormal glucose: Secondary | ICD-10-CM | POA: Diagnosis not present

## 2019-12-13 DIAGNOSIS — N319 Neuromuscular dysfunction of bladder, unspecified: Secondary | ICD-10-CM | POA: Diagnosis not present

## 2019-12-13 DIAGNOSIS — G9529 Other cord compression: Secondary | ICD-10-CM | POA: Diagnosis not present

## 2019-12-13 DIAGNOSIS — B9689 Other specified bacterial agents as the cause of diseases classified elsewhere: Secondary | ICD-10-CM | POA: Diagnosis not present

## 2019-12-16 DIAGNOSIS — D638 Anemia in other chronic diseases classified elsewhere: Secondary | ICD-10-CM | POA: Diagnosis not present

## 2019-12-16 DIAGNOSIS — I480 Paroxysmal atrial fibrillation: Secondary | ICD-10-CM | POA: Diagnosis not present

## 2019-12-16 DIAGNOSIS — G8220 Paraplegia, unspecified: Secondary | ICD-10-CM | POA: Diagnosis not present

## 2019-12-16 DIAGNOSIS — Z4789 Encounter for other orthopedic aftercare: Secondary | ICD-10-CM | POA: Diagnosis not present

## 2019-12-16 DIAGNOSIS — L89153 Pressure ulcer of sacral region, stage 3: Secondary | ICD-10-CM | POA: Diagnosis not present

## 2019-12-16 DIAGNOSIS — G9529 Other cord compression: Secondary | ICD-10-CM | POA: Diagnosis not present

## 2019-12-16 DIAGNOSIS — M4653 Other infective spondylopathies, cervicothoracic region: Secondary | ICD-10-CM | POA: Diagnosis not present

## 2019-12-16 DIAGNOSIS — N319 Neuromuscular dysfunction of bladder, unspecified: Secondary | ICD-10-CM | POA: Diagnosis not present

## 2019-12-16 DIAGNOSIS — R7309 Other abnormal glucose: Secondary | ICD-10-CM | POA: Diagnosis not present

## 2019-12-16 DIAGNOSIS — Z452 Encounter for adjustment and management of vascular access device: Secondary | ICD-10-CM | POA: Diagnosis not present

## 2019-12-16 DIAGNOSIS — G062 Extradural and subdural abscess, unspecified: Secondary | ICD-10-CM | POA: Diagnosis not present

## 2019-12-16 DIAGNOSIS — K592 Neurogenic bowel, not elsewhere classified: Secondary | ICD-10-CM | POA: Diagnosis not present

## 2019-12-16 DIAGNOSIS — B9689 Other specified bacterial agents as the cause of diseases classified elsewhere: Secondary | ICD-10-CM | POA: Diagnosis not present

## 2019-12-17 DIAGNOSIS — L89153 Pressure ulcer of sacral region, stage 3: Secondary | ICD-10-CM | POA: Diagnosis not present

## 2019-12-17 DIAGNOSIS — G8220 Paraplegia, unspecified: Secondary | ICD-10-CM | POA: Diagnosis not present

## 2019-12-17 DIAGNOSIS — I480 Paroxysmal atrial fibrillation: Secondary | ICD-10-CM | POA: Diagnosis not present

## 2019-12-17 DIAGNOSIS — Z452 Encounter for adjustment and management of vascular access device: Secondary | ICD-10-CM | POA: Diagnosis not present

## 2019-12-17 DIAGNOSIS — G9529 Other cord compression: Secondary | ICD-10-CM | POA: Diagnosis not present

## 2019-12-17 DIAGNOSIS — N319 Neuromuscular dysfunction of bladder, unspecified: Secondary | ICD-10-CM | POA: Diagnosis not present

## 2019-12-17 DIAGNOSIS — B9689 Other specified bacterial agents as the cause of diseases classified elsewhere: Secondary | ICD-10-CM | POA: Diagnosis not present

## 2019-12-17 DIAGNOSIS — M4653 Other infective spondylopathies, cervicothoracic region: Secondary | ICD-10-CM | POA: Diagnosis not present

## 2019-12-17 DIAGNOSIS — D638 Anemia in other chronic diseases classified elsewhere: Secondary | ICD-10-CM | POA: Diagnosis not present

## 2019-12-17 DIAGNOSIS — R7309 Other abnormal glucose: Secondary | ICD-10-CM | POA: Diagnosis not present

## 2019-12-17 DIAGNOSIS — Z4789 Encounter for other orthopedic aftercare: Secondary | ICD-10-CM | POA: Diagnosis not present

## 2019-12-17 DIAGNOSIS — K592 Neurogenic bowel, not elsewhere classified: Secondary | ICD-10-CM | POA: Diagnosis not present

## 2019-12-17 DIAGNOSIS — G062 Extradural and subdural abscess, unspecified: Secondary | ICD-10-CM | POA: Diagnosis not present

## 2019-12-20 ENCOUNTER — Telehealth: Payer: Self-pay

## 2019-12-20 DIAGNOSIS — N319 Neuromuscular dysfunction of bladder, unspecified: Secondary | ICD-10-CM | POA: Diagnosis not present

## 2019-12-20 DIAGNOSIS — L89153 Pressure ulcer of sacral region, stage 3: Secondary | ICD-10-CM | POA: Diagnosis not present

## 2019-12-20 DIAGNOSIS — G9529 Other cord compression: Secondary | ICD-10-CM | POA: Diagnosis not present

## 2019-12-20 DIAGNOSIS — D638 Anemia in other chronic diseases classified elsewhere: Secondary | ICD-10-CM | POA: Diagnosis not present

## 2019-12-20 DIAGNOSIS — K592 Neurogenic bowel, not elsewhere classified: Secondary | ICD-10-CM | POA: Diagnosis not present

## 2019-12-20 DIAGNOSIS — B9689 Other specified bacterial agents as the cause of diseases classified elsewhere: Secondary | ICD-10-CM | POA: Diagnosis not present

## 2019-12-20 DIAGNOSIS — Z452 Encounter for adjustment and management of vascular access device: Secondary | ICD-10-CM | POA: Diagnosis not present

## 2019-12-20 DIAGNOSIS — G062 Extradural and subdural abscess, unspecified: Secondary | ICD-10-CM | POA: Diagnosis not present

## 2019-12-20 DIAGNOSIS — R7309 Other abnormal glucose: Secondary | ICD-10-CM | POA: Diagnosis not present

## 2019-12-20 DIAGNOSIS — I480 Paroxysmal atrial fibrillation: Secondary | ICD-10-CM | POA: Diagnosis not present

## 2019-12-20 DIAGNOSIS — M4653 Other infective spondylopathies, cervicothoracic region: Secondary | ICD-10-CM | POA: Diagnosis not present

## 2019-12-20 DIAGNOSIS — Z4789 Encounter for other orthopedic aftercare: Secondary | ICD-10-CM | POA: Diagnosis not present

## 2019-12-20 DIAGNOSIS — G8220 Paraplegia, unspecified: Secondary | ICD-10-CM | POA: Diagnosis not present

## 2019-12-20 NOTE — Telephone Encounter (Signed)
Patient's wife called office today for clarification on oral antibiotics and questions regarding covid vaccine.  Spouse would like to know if patient should continue taking oral antibiotics until there are no more refills. Would also like to know if patient should get covid vaccine now or wait until he has recovered from spinal surgery.  Will forward message to MD to advise on patient questions.  [place]

## 2019-12-20 NOTE — Telephone Encounter (Signed)
Spoke to the patient and gave instructions.

## 2019-12-23 DIAGNOSIS — M4653 Other infective spondylopathies, cervicothoracic region: Secondary | ICD-10-CM | POA: Diagnosis not present

## 2019-12-23 DIAGNOSIS — Z452 Encounter for adjustment and management of vascular access device: Secondary | ICD-10-CM | POA: Diagnosis not present

## 2019-12-23 DIAGNOSIS — G062 Extradural and subdural abscess, unspecified: Secondary | ICD-10-CM | POA: Diagnosis not present

## 2019-12-23 DIAGNOSIS — L89153 Pressure ulcer of sacral region, stage 3: Secondary | ICD-10-CM | POA: Diagnosis not present

## 2019-12-23 DIAGNOSIS — G8220 Paraplegia, unspecified: Secondary | ICD-10-CM | POA: Diagnosis not present

## 2019-12-23 DIAGNOSIS — I480 Paroxysmal atrial fibrillation: Secondary | ICD-10-CM | POA: Diagnosis not present

## 2019-12-23 DIAGNOSIS — R7309 Other abnormal glucose: Secondary | ICD-10-CM | POA: Diagnosis not present

## 2019-12-23 DIAGNOSIS — B9689 Other specified bacterial agents as the cause of diseases classified elsewhere: Secondary | ICD-10-CM | POA: Diagnosis not present

## 2019-12-23 DIAGNOSIS — K592 Neurogenic bowel, not elsewhere classified: Secondary | ICD-10-CM | POA: Diagnosis not present

## 2019-12-23 DIAGNOSIS — G9529 Other cord compression: Secondary | ICD-10-CM | POA: Diagnosis not present

## 2019-12-23 DIAGNOSIS — D638 Anemia in other chronic diseases classified elsewhere: Secondary | ICD-10-CM | POA: Diagnosis not present

## 2019-12-23 DIAGNOSIS — N319 Neuromuscular dysfunction of bladder, unspecified: Secondary | ICD-10-CM | POA: Diagnosis not present

## 2019-12-23 DIAGNOSIS — Z4789 Encounter for other orthopedic aftercare: Secondary | ICD-10-CM | POA: Diagnosis not present

## 2019-12-24 DIAGNOSIS — Z4789 Encounter for other orthopedic aftercare: Secondary | ICD-10-CM | POA: Diagnosis not present

## 2019-12-24 DIAGNOSIS — D638 Anemia in other chronic diseases classified elsewhere: Secondary | ICD-10-CM | POA: Diagnosis not present

## 2019-12-24 DIAGNOSIS — G9529 Other cord compression: Secondary | ICD-10-CM | POA: Diagnosis not present

## 2019-12-24 DIAGNOSIS — L89153 Pressure ulcer of sacral region, stage 3: Secondary | ICD-10-CM | POA: Diagnosis not present

## 2019-12-24 DIAGNOSIS — K592 Neurogenic bowel, not elsewhere classified: Secondary | ICD-10-CM | POA: Diagnosis not present

## 2019-12-24 DIAGNOSIS — I480 Paroxysmal atrial fibrillation: Secondary | ICD-10-CM | POA: Diagnosis not present

## 2019-12-24 DIAGNOSIS — M4653 Other infective spondylopathies, cervicothoracic region: Secondary | ICD-10-CM | POA: Diagnosis not present

## 2019-12-24 DIAGNOSIS — N319 Neuromuscular dysfunction of bladder, unspecified: Secondary | ICD-10-CM | POA: Diagnosis not present

## 2019-12-24 DIAGNOSIS — Z452 Encounter for adjustment and management of vascular access device: Secondary | ICD-10-CM | POA: Diagnosis not present

## 2019-12-24 DIAGNOSIS — G062 Extradural and subdural abscess, unspecified: Secondary | ICD-10-CM | POA: Diagnosis not present

## 2019-12-24 DIAGNOSIS — B9689 Other specified bacterial agents as the cause of diseases classified elsewhere: Secondary | ICD-10-CM | POA: Diagnosis not present

## 2019-12-24 DIAGNOSIS — G8220 Paraplegia, unspecified: Secondary | ICD-10-CM | POA: Diagnosis not present

## 2019-12-24 DIAGNOSIS — R7309 Other abnormal glucose: Secondary | ICD-10-CM | POA: Diagnosis not present

## 2019-12-27 DIAGNOSIS — R7309 Other abnormal glucose: Secondary | ICD-10-CM | POA: Diagnosis not present

## 2019-12-27 DIAGNOSIS — K592 Neurogenic bowel, not elsewhere classified: Secondary | ICD-10-CM | POA: Diagnosis not present

## 2019-12-27 DIAGNOSIS — L89153 Pressure ulcer of sacral region, stage 3: Secondary | ICD-10-CM | POA: Diagnosis not present

## 2019-12-27 DIAGNOSIS — B9689 Other specified bacterial agents as the cause of diseases classified elsewhere: Secondary | ICD-10-CM | POA: Diagnosis not present

## 2019-12-27 DIAGNOSIS — N319 Neuromuscular dysfunction of bladder, unspecified: Secondary | ICD-10-CM | POA: Diagnosis not present

## 2019-12-27 DIAGNOSIS — D638 Anemia in other chronic diseases classified elsewhere: Secondary | ICD-10-CM | POA: Diagnosis not present

## 2019-12-27 DIAGNOSIS — G8220 Paraplegia, unspecified: Secondary | ICD-10-CM | POA: Diagnosis not present

## 2019-12-27 DIAGNOSIS — G9529 Other cord compression: Secondary | ICD-10-CM | POA: Diagnosis not present

## 2019-12-27 DIAGNOSIS — Z4789 Encounter for other orthopedic aftercare: Secondary | ICD-10-CM | POA: Diagnosis not present

## 2019-12-27 DIAGNOSIS — Z452 Encounter for adjustment and management of vascular access device: Secondary | ICD-10-CM | POA: Diagnosis not present

## 2019-12-27 DIAGNOSIS — M4653 Other infective spondylopathies, cervicothoracic region: Secondary | ICD-10-CM | POA: Diagnosis not present

## 2019-12-27 DIAGNOSIS — I480 Paroxysmal atrial fibrillation: Secondary | ICD-10-CM | POA: Diagnosis not present

## 2019-12-27 DIAGNOSIS — G062 Extradural and subdural abscess, unspecified: Secondary | ICD-10-CM | POA: Diagnosis not present

## 2019-12-31 DIAGNOSIS — L89153 Pressure ulcer of sacral region, stage 3: Secondary | ICD-10-CM | POA: Diagnosis not present

## 2019-12-31 DIAGNOSIS — D638 Anemia in other chronic diseases classified elsewhere: Secondary | ICD-10-CM | POA: Diagnosis not present

## 2019-12-31 DIAGNOSIS — I480 Paroxysmal atrial fibrillation: Secondary | ICD-10-CM | POA: Diagnosis not present

## 2019-12-31 DIAGNOSIS — G062 Extradural and subdural abscess, unspecified: Secondary | ICD-10-CM | POA: Diagnosis not present

## 2019-12-31 DIAGNOSIS — Z452 Encounter for adjustment and management of vascular access device: Secondary | ICD-10-CM | POA: Diagnosis not present

## 2019-12-31 DIAGNOSIS — R7309 Other abnormal glucose: Secondary | ICD-10-CM | POA: Diagnosis not present

## 2019-12-31 DIAGNOSIS — K592 Neurogenic bowel, not elsewhere classified: Secondary | ICD-10-CM | POA: Diagnosis not present

## 2019-12-31 DIAGNOSIS — Z4789 Encounter for other orthopedic aftercare: Secondary | ICD-10-CM | POA: Diagnosis not present

## 2019-12-31 DIAGNOSIS — G9529 Other cord compression: Secondary | ICD-10-CM | POA: Diagnosis not present

## 2019-12-31 DIAGNOSIS — B9689 Other specified bacterial agents as the cause of diseases classified elsewhere: Secondary | ICD-10-CM | POA: Diagnosis not present

## 2019-12-31 DIAGNOSIS — N319 Neuromuscular dysfunction of bladder, unspecified: Secondary | ICD-10-CM | POA: Diagnosis not present

## 2019-12-31 DIAGNOSIS — G8220 Paraplegia, unspecified: Secondary | ICD-10-CM | POA: Diagnosis not present

## 2019-12-31 DIAGNOSIS — M4653 Other infective spondylopathies, cervicothoracic region: Secondary | ICD-10-CM | POA: Diagnosis not present

## 2020-01-02 DIAGNOSIS — B9689 Other specified bacterial agents as the cause of diseases classified elsewhere: Secondary | ICD-10-CM | POA: Diagnosis not present

## 2020-01-02 DIAGNOSIS — Z452 Encounter for adjustment and management of vascular access device: Secondary | ICD-10-CM | POA: Diagnosis not present

## 2020-01-02 DIAGNOSIS — G9529 Other cord compression: Secondary | ICD-10-CM | POA: Diagnosis not present

## 2020-01-02 DIAGNOSIS — G8220 Paraplegia, unspecified: Secondary | ICD-10-CM | POA: Diagnosis not present

## 2020-01-02 DIAGNOSIS — I480 Paroxysmal atrial fibrillation: Secondary | ICD-10-CM | POA: Diagnosis not present

## 2020-01-02 DIAGNOSIS — M4653 Other infective spondylopathies, cervicothoracic region: Secondary | ICD-10-CM | POA: Diagnosis not present

## 2020-01-02 DIAGNOSIS — R7309 Other abnormal glucose: Secondary | ICD-10-CM | POA: Diagnosis not present

## 2020-01-02 DIAGNOSIS — G062 Extradural and subdural abscess, unspecified: Secondary | ICD-10-CM | POA: Diagnosis not present

## 2020-01-02 DIAGNOSIS — D638 Anemia in other chronic diseases classified elsewhere: Secondary | ICD-10-CM | POA: Diagnosis not present

## 2020-01-02 DIAGNOSIS — K592 Neurogenic bowel, not elsewhere classified: Secondary | ICD-10-CM | POA: Diagnosis not present

## 2020-01-02 DIAGNOSIS — Z4789 Encounter for other orthopedic aftercare: Secondary | ICD-10-CM | POA: Diagnosis not present

## 2020-01-02 DIAGNOSIS — L89153 Pressure ulcer of sacral region, stage 3: Secondary | ICD-10-CM | POA: Diagnosis not present

## 2020-01-02 DIAGNOSIS — N319 Neuromuscular dysfunction of bladder, unspecified: Secondary | ICD-10-CM | POA: Diagnosis not present

## 2020-01-03 DIAGNOSIS — I480 Paroxysmal atrial fibrillation: Secondary | ICD-10-CM | POA: Diagnosis not present

## 2020-01-03 DIAGNOSIS — N319 Neuromuscular dysfunction of bladder, unspecified: Secondary | ICD-10-CM | POA: Diagnosis not present

## 2020-01-03 DIAGNOSIS — G8250 Quadriplegia, unspecified: Secondary | ICD-10-CM | POA: Diagnosis not present

## 2020-01-03 DIAGNOSIS — G9529 Other cord compression: Secondary | ICD-10-CM | POA: Diagnosis not present

## 2020-01-03 DIAGNOSIS — B9689 Other specified bacterial agents as the cause of diseases classified elsewhere: Secondary | ICD-10-CM | POA: Diagnosis not present

## 2020-01-03 DIAGNOSIS — R7309 Other abnormal glucose: Secondary | ICD-10-CM | POA: Diagnosis not present

## 2020-01-03 DIAGNOSIS — L89153 Pressure ulcer of sacral region, stage 3: Secondary | ICD-10-CM | POA: Diagnosis not present

## 2020-01-03 DIAGNOSIS — G8220 Paraplegia, unspecified: Secondary | ICD-10-CM | POA: Diagnosis not present

## 2020-01-03 DIAGNOSIS — D638 Anemia in other chronic diseases classified elsewhere: Secondary | ICD-10-CM | POA: Diagnosis not present

## 2020-01-03 DIAGNOSIS — G062 Extradural and subdural abscess, unspecified: Secondary | ICD-10-CM | POA: Diagnosis not present

## 2020-01-03 DIAGNOSIS — Z452 Encounter for adjustment and management of vascular access device: Secondary | ICD-10-CM | POA: Diagnosis not present

## 2020-01-03 DIAGNOSIS — K592 Neurogenic bowel, not elsewhere classified: Secondary | ICD-10-CM | POA: Diagnosis not present

## 2020-01-03 DIAGNOSIS — M4653 Other infective spondylopathies, cervicothoracic region: Secondary | ICD-10-CM | POA: Diagnosis not present

## 2020-01-03 DIAGNOSIS — Z4789 Encounter for other orthopedic aftercare: Secondary | ICD-10-CM | POA: Diagnosis not present

## 2020-01-07 DIAGNOSIS — B9689 Other specified bacterial agents as the cause of diseases classified elsewhere: Secondary | ICD-10-CM | POA: Diagnosis not present

## 2020-01-07 DIAGNOSIS — N319 Neuromuscular dysfunction of bladder, unspecified: Secondary | ICD-10-CM | POA: Diagnosis not present

## 2020-01-07 DIAGNOSIS — R7309 Other abnormal glucose: Secondary | ICD-10-CM | POA: Diagnosis not present

## 2020-01-07 DIAGNOSIS — G062 Extradural and subdural abscess, unspecified: Secondary | ICD-10-CM | POA: Diagnosis not present

## 2020-01-07 DIAGNOSIS — G061 Intraspinal abscess and granuloma: Secondary | ICD-10-CM | POA: Diagnosis not present

## 2020-01-07 DIAGNOSIS — M4653 Other infective spondylopathies, cervicothoracic region: Secondary | ICD-10-CM | POA: Diagnosis not present

## 2020-01-07 DIAGNOSIS — K592 Neurogenic bowel, not elsewhere classified: Secondary | ICD-10-CM | POA: Diagnosis not present

## 2020-01-07 DIAGNOSIS — L89153 Pressure ulcer of sacral region, stage 3: Secondary | ICD-10-CM | POA: Diagnosis not present

## 2020-01-07 DIAGNOSIS — D638 Anemia in other chronic diseases classified elsewhere: Secondary | ICD-10-CM | POA: Diagnosis not present

## 2020-01-07 DIAGNOSIS — G9529 Other cord compression: Secondary | ICD-10-CM | POA: Diagnosis not present

## 2020-01-07 DIAGNOSIS — G8220 Paraplegia, unspecified: Secondary | ICD-10-CM | POA: Diagnosis not present

## 2020-01-07 DIAGNOSIS — I480 Paroxysmal atrial fibrillation: Secondary | ICD-10-CM | POA: Diagnosis not present

## 2020-01-07 DIAGNOSIS — Z4789 Encounter for other orthopedic aftercare: Secondary | ICD-10-CM | POA: Diagnosis not present

## 2020-01-07 DIAGNOSIS — G8250 Quadriplegia, unspecified: Secondary | ICD-10-CM | POA: Diagnosis not present

## 2020-01-07 DIAGNOSIS — H6123 Impacted cerumen, bilateral: Secondary | ICD-10-CM | POA: Diagnosis not present

## 2020-01-07 DIAGNOSIS — Z452 Encounter for adjustment and management of vascular access device: Secondary | ICD-10-CM | POA: Diagnosis not present

## 2020-01-08 DIAGNOSIS — M4653 Other infective spondylopathies, cervicothoracic region: Secondary | ICD-10-CM | POA: Diagnosis not present

## 2020-01-08 DIAGNOSIS — L89153 Pressure ulcer of sacral region, stage 3: Secondary | ICD-10-CM | POA: Diagnosis not present

## 2020-01-08 DIAGNOSIS — D638 Anemia in other chronic diseases classified elsewhere: Secondary | ICD-10-CM | POA: Diagnosis not present

## 2020-01-08 DIAGNOSIS — G8220 Paraplegia, unspecified: Secondary | ICD-10-CM | POA: Diagnosis not present

## 2020-01-08 DIAGNOSIS — G9529 Other cord compression: Secondary | ICD-10-CM | POA: Diagnosis not present

## 2020-01-08 DIAGNOSIS — N319 Neuromuscular dysfunction of bladder, unspecified: Secondary | ICD-10-CM | POA: Diagnosis not present

## 2020-01-08 DIAGNOSIS — B9689 Other specified bacterial agents as the cause of diseases classified elsewhere: Secondary | ICD-10-CM | POA: Diagnosis not present

## 2020-01-08 DIAGNOSIS — G062 Extradural and subdural abscess, unspecified: Secondary | ICD-10-CM | POA: Diagnosis not present

## 2020-01-08 DIAGNOSIS — Z452 Encounter for adjustment and management of vascular access device: Secondary | ICD-10-CM | POA: Diagnosis not present

## 2020-01-08 DIAGNOSIS — K592 Neurogenic bowel, not elsewhere classified: Secondary | ICD-10-CM | POA: Diagnosis not present

## 2020-01-08 DIAGNOSIS — Z4789 Encounter for other orthopedic aftercare: Secondary | ICD-10-CM | POA: Diagnosis not present

## 2020-01-08 DIAGNOSIS — R7309 Other abnormal glucose: Secondary | ICD-10-CM | POA: Diagnosis not present

## 2020-01-08 DIAGNOSIS — I480 Paroxysmal atrial fibrillation: Secondary | ICD-10-CM | POA: Diagnosis not present

## 2020-01-10 DIAGNOSIS — G9529 Other cord compression: Secondary | ICD-10-CM | POA: Diagnosis not present

## 2020-01-10 DIAGNOSIS — R7309 Other abnormal glucose: Secondary | ICD-10-CM | POA: Diagnosis not present

## 2020-01-10 DIAGNOSIS — K592 Neurogenic bowel, not elsewhere classified: Secondary | ICD-10-CM | POA: Diagnosis not present

## 2020-01-10 DIAGNOSIS — Z452 Encounter for adjustment and management of vascular access device: Secondary | ICD-10-CM | POA: Diagnosis not present

## 2020-01-10 DIAGNOSIS — D638 Anemia in other chronic diseases classified elsewhere: Secondary | ICD-10-CM | POA: Diagnosis not present

## 2020-01-10 DIAGNOSIS — M4653 Other infective spondylopathies, cervicothoracic region: Secondary | ICD-10-CM | POA: Diagnosis not present

## 2020-01-10 DIAGNOSIS — N319 Neuromuscular dysfunction of bladder, unspecified: Secondary | ICD-10-CM | POA: Diagnosis not present

## 2020-01-10 DIAGNOSIS — I480 Paroxysmal atrial fibrillation: Secondary | ICD-10-CM | POA: Diagnosis not present

## 2020-01-10 DIAGNOSIS — Z4789 Encounter for other orthopedic aftercare: Secondary | ICD-10-CM | POA: Diagnosis not present

## 2020-01-10 DIAGNOSIS — B9689 Other specified bacterial agents as the cause of diseases classified elsewhere: Secondary | ICD-10-CM | POA: Diagnosis not present

## 2020-01-10 DIAGNOSIS — L89153 Pressure ulcer of sacral region, stage 3: Secondary | ICD-10-CM | POA: Diagnosis not present

## 2020-01-10 DIAGNOSIS — G062 Extradural and subdural abscess, unspecified: Secondary | ICD-10-CM | POA: Diagnosis not present

## 2020-01-10 DIAGNOSIS — G8220 Paraplegia, unspecified: Secondary | ICD-10-CM | POA: Diagnosis not present

## 2020-01-13 DIAGNOSIS — G8250 Quadriplegia, unspecified: Secondary | ICD-10-CM | POA: Diagnosis not present

## 2020-01-16 ENCOUNTER — Other Ambulatory Visit: Payer: Self-pay | Admitting: Neurological Surgery

## 2020-01-16 DIAGNOSIS — G062 Extradural and subdural abscess, unspecified: Secondary | ICD-10-CM

## 2020-01-20 ENCOUNTER — Ambulatory Visit: Payer: Medicare Other | Attending: Neurological Surgery | Admitting: Physical Therapy

## 2020-01-20 ENCOUNTER — Other Ambulatory Visit: Payer: Self-pay

## 2020-01-20 DIAGNOSIS — R262 Difficulty in walking, not elsewhere classified: Secondary | ICD-10-CM | POA: Diagnosis not present

## 2020-01-20 DIAGNOSIS — R2681 Unsteadiness on feet: Secondary | ICD-10-CM | POA: Diagnosis not present

## 2020-01-20 DIAGNOSIS — R29818 Other symptoms and signs involving the nervous system: Secondary | ICD-10-CM | POA: Insufficient documentation

## 2020-01-20 DIAGNOSIS — R2689 Other abnormalities of gait and mobility: Secondary | ICD-10-CM | POA: Diagnosis not present

## 2020-01-20 DIAGNOSIS — M6281 Muscle weakness (generalized): Secondary | ICD-10-CM | POA: Insufficient documentation

## 2020-01-20 DIAGNOSIS — R293 Abnormal posture: Secondary | ICD-10-CM | POA: Insufficient documentation

## 2020-01-20 DIAGNOSIS — M21372 Foot drop, left foot: Secondary | ICD-10-CM | POA: Diagnosis not present

## 2020-01-21 ENCOUNTER — Ambulatory Visit: Payer: Medicare Other | Admitting: Physical Therapy

## 2020-01-21 DIAGNOSIS — M6281 Muscle weakness (generalized): Secondary | ICD-10-CM

## 2020-01-21 DIAGNOSIS — M21372 Foot drop, left foot: Secondary | ICD-10-CM | POA: Diagnosis not present

## 2020-01-21 DIAGNOSIS — R293 Abnormal posture: Secondary | ICD-10-CM | POA: Diagnosis not present

## 2020-01-21 DIAGNOSIS — R29818 Other symptoms and signs involving the nervous system: Secondary | ICD-10-CM

## 2020-01-21 DIAGNOSIS — R2689 Other abnormalities of gait and mobility: Secondary | ICD-10-CM | POA: Diagnosis not present

## 2020-01-21 DIAGNOSIS — R262 Difficulty in walking, not elsewhere classified: Secondary | ICD-10-CM | POA: Diagnosis not present

## 2020-01-21 DIAGNOSIS — R2681 Unsteadiness on feet: Secondary | ICD-10-CM | POA: Diagnosis not present

## 2020-01-21 NOTE — Therapy (Signed)
[facility] [address] [place], [place], [address] Phone: [phone]   Fax:  [phone]  Physical Therapy Evaluation  Patient Details  Name: Ryan Reed MRN: [id number] Date of Birth: [birth year] Referring Provider (PT): [name], MD   Encounter Date: 01/20/2020  PT End of Session - 01/21/20 1525    Visit Number  1    Number of Visits  25    Date for PT Re-Evaluation  04/20/20    PT Start Time  1100    PT Stop Time  1147    PT Time Calculation (min)  47 min    Equipment Utilized During Treatment  Gait belt    Activity Tolerance  Patient tolerated treatment well    Behavior During Therapy  [facility] for tasks assessed/performed       No past medical history on file.  Past Surgical History:  Procedure Laterality Date  . LACERATION REPAIR Left    left thigh  . THORACIC LAMINECTOMY FOR EPIDURAL ABSCESS N/A 10/14/2019   Procedure: Cervical seven - THORACIC seven LAMINECTOMIES FOR EPIDURAL ABSCESS;  Surgeon: [name], MD;  Location: [place];  Service: Neurosurgery;  Laterality: N/A;    There were no vitals filed for this visit.       01/20/20 1105  Symptoms/Limitations  Subjective Patient is a 69 year old male who developed acute onset of paraplegia with sensory loss and was admitted on 10/14/19 for work up. 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  He was taken to the OR emergently for C7-T7 posterior decompressive laminectomy. CIR on 10/18/19 and discharged from hospital 11/13/19. Worked with home health for about a month - was working on standing with RW and taking a couple of steps with HHPT (had walked 20 steps).  R leg is stronger than L leg. Has sensation, but has numbness in buttocks and feet as well as other intermittent numbness. Full use of hands and  arms. Has been measured from a personal lightweight w/c - should be here soon. Can transfer by himself and do a lot of ADLs by himself. The hardest thing is putting on his R sock and shoe. Had a couple of episodes of AD in the hospital and at home (hasn't happened in a couple of weeks). Pressure sore on buttocks is getting a lot better - is doing his pressure relif at home. Cervical precautions and weight lifting precations lifted last week.  Patient is accompained by: Family member (wife, coral)  Pertinent History hard of hearing, SCI (09/2019)  Patient Stated Goals wants to walk again,  Pain Assessment  Currently in Pain? Yes  Pain Score 3  Pain Location  (lower back and B hips)  Pain Descriptors / Indicators Aching  Pain Relieving Factors muscle relaxers, pain pills            01/20/20 1117  Assessment  Medical Diagnosis SCI - paraplegia  Referring Provider (PT) [name], MD  Onset Date/Surgical Date 10/14/19  Hand Dominance Left  Prior Therapy CIR and HHPT  Precautions  Precaution Comments cervical precautions lifted last week   Balance Screen  Has the patient fallen in the past 6 months No  Has the patient had a decrease in activity level because of a fear of falling?  No  Is the patient reluctant to leave their home  because of a fear of falling?  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 Private residence  Living Arrangements Children;Spouse/significant other;Other (Comment) (daughter comes and helps a couple days a week)  Available Help at Discharge Family  Type of [place] entrance  Home Layout One level  Home Equipment Wheelchair - manual;BSC;Walker - standard;Other (comment);Hospital bed (getting a shower chair )  Prior Function  Level of Independence Independent  Leisure gardening, raises chicken, likes woodworking   Cognition  Overall Cognitive Status Within Functional Limits for tasks assessed  Sensation  Light Touch Impaired  by gross assessment;Impaired Detail  Light Touch Impaired Details Impaired RUE;Impaired LLE (can detect, has incr numbness)  Proprioception Appears Intact (B ankles)  Additional Comments "touchy numbness" in abdomen, buttocks are numb  Coordination  Gross Motor Movements are Fluid and Coordinated No (due to weakness)  Posture/Postural Control  Posture/Postural Control Postural limitations  Postural Limitations Posterior pelvic tilt;Rounded Shoulders;Forward head  Posture Comments pt and pt's wife asking what can they do to make pt's chair more comfortable before receiving pt's new one - therapist demonstrated how to make a lumbar support out of a rolled up towel, pt reporting relief after therapist demonstrated and pt had more upright posture  ROM / Strength  AROM / PROM / Strength Strength;PROM  PROM  Overall PROM Comments incr hip tightness LLE>RLE, especially limited with hip IR B, when supine, pt unable to fully extend BLE due to hamstring tightness, B calf tightness with knee extended   Strength  Overall Strength Comments grossly 5/5 BUE  Strength Assessment Site Hip;Knee;Ankle  Right/Left Hip Right;Left  Right/Left Knee Right;Left  Right/Left Ankle Right;Left  Right Hip Flexion 4-/5  Left Hip Flexion 3/5  Right Ankle Dorsiflexion 3-/5  Right Ankle Eversion 3/5  Left Ankle Dorsiflexion 2+/5  Left Ankle Inversion 2+/5  Right Knee Flexion 4-/5  Right Knee Extension 4/5  Left Knee Flexion 3-/5  Left Knee Extension 3-/5  Bed Mobility  Bed Mobility Supine to Sit;Rolling Left;Sit to Supine  Rolling Left Independent  Supine to Sit Independent  Sit to Supine Independent  Transfers  Transfers Lateral/Scoot Transfers;Sit to Stand;Stand to Sit  Sit to Stand 4: Min assist  Sit to Stand Details (indicate cue type and reason) from elevated mat table, pt performs with BUE support on RW, needing min A for balance and to help steady walker when standing   Stand to Sit 4: Min guard   Lateral/Scoot Transfers 6: Modified independent (Device/Increase time);With slide board;Other (comment) (from w/c <> mat table)  Ambulation/Gait  Gait Comments unable to assess today due to time Conservation officer, historic buildings Both upper extremities  Wheelchair Parts Management Independent  Distance 100' (x2 into and out of clinic )        Objective measurements completed on examination: See above findings.              PT Education - 01/21/20 1524    Education Details  clinical findings, POC    Person(s) Educated  Patient;Spouse    Methods  Explanation    Comprehension  Verbalized understanding       PT Short Term Goals - 01/21/20 2057      PT SHORT TERM GOAL #1   Title  Pt and pt's wife will be independent with initial HEP for LE stretching and strengthening. ALL STGS DUE 02/18/20    Time  4    Period  Weeks  Status  New    Target Date  02/18/20      PT SHORT TERM GOAL #2   Title  Pt will perform sit <> stand from a mat table with RW and min guard - with single UE on RW and other UE on mat table for incr safety.    Time  4    Period  Weeks    Status  New      PT SHORT TERM GOAL #3   Title  Pt will tolerate standing at countertop for at least 5 minutes with min guard in order to improve tolerance for ADLs.    Time  4    Period  Weeks    Status  New      PT SHORT TERM GOAL #4   Title  Pt will ambulate at least 115' with RW and min A in order to improve functional mobility.    Time  4    Period  Weeks    Status  New      PT SHORT TERM GOAL #5   Title  Pt will undergo further assessment of L foot up brace vs. L AFO in order to improve foot clearance during gait.    Time  4    Period  Weeks    Status  New      Additional Short Term Goals   Additional Short Term Goals  Yes      PT SHORT TERM GOAL #6   Title  Pt will perform stand pivot transfer from w/c <> mat table with RW and min A.    Time  4     Period  Weeks    Status  New        PT Long Term Goals - 01/21/20 2100      PT LONG TERM GOAL #1   Title  Pt and pt's wife will be independent with final HEP for LE stretching and strengthening. ALL LTGS DUE 04/14/20    Time  12    Period  Weeks    Status  New    Target Date  04/14/20      PT LONG TERM GOAL #2   Title  Pt will ambulate at least 230' with RW and supervision in order to improve functional mobility.    Time  12    Period  Weeks    Status  New      PT LONG TERM GOAL #3   Title  Pt will undergo further assessment of 5x sit <> stand from mat table when appropriate to determine functional LE strength, LTG to be written.    Time  12    Period  Weeks    Status  New      PT LONG TERM GOAL #4   Title  Pt will undergo further assessment of gait speed with RW when appropriate.    Time  12    Period  Weeks    Status  New      PT LONG TERM GOAL #5   Title  Pt will perform all transfers from manual w/c with mod I in order to improve independence.    Time  12    Period  Weeks    Status  New             Plan - 01/21/20 2048    Clinical Impression Statement  Patient is a 69 year old male referred to Neuro OPPT for evaluation s/p SCI (09/2019)  with paraplegia. 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  He was taken to the OR emergently for C7-T7 posterior decompressive laminectomy. CIR on 10/18/19 and discharged from hospital 11/13/19. Worked with home health for about a month. Pt with improvements in LE strength since being discharged from the hospital. The following deficits were present during the exam: impaired sensation (primarily in lower extremities),  decreased lower extremity strength, postural deficits, decr standing tolerance, decreased PROM and AROM in BLE, low back pain. Pt would benefit from skilled PT to address  these impairments and functional limitations to maximize functional mobility independence.    Personal Factors and Comorbidities  Past/Current Experience    Examination-Activity Limitations  Bathing;Stand;Squat;Locomotion Level;Bend;Hygiene/Grooming    Examination-Participation Restrictions  Cleaning;Community Activity;Yard Work    Clinical Decision Making  Moderate    Rehab Potential  Good    PT Frequency  2x / week    PT Duration  12 weeks    PT Treatment/Interventions  ADLs/Self Care Home Management;Aquatic Therapy;Electrical Stimulation;DME Instruction;Gait training;Stair training;Functional mobility training;Neuromuscular re-education;Balance training;Therapeutic exercise;Therapeutic activities;Patient/family education;Orthotic Fit/Training;Passive range of motion    PT Next Visit Plan  assess gait with RW, trial L AFO vs. foot up brace due to ankle DF weakness, review stretches/exercises from home health - add any additional that are appropriate. would pt be a good candidate for bioness?    Consulted and Agree with Plan of Care  Patient;Family member/caregiver    Family Member Consulted  pt's wife coral       Patient will benefit from skilled therapeutic intervention in order to improve the following deficits and impairments:  Abnormal gait, Decreased activity tolerance, Decreased balance, Decreased endurance, Decreased coordination, Decreased mobility, Decreased range of motion, Difficulty walking, Decreased strength, Impaired flexibility, Impaired sensation, Postural dysfunction, Pain  Visit Diagnosis: Muscle weakness (generalized)  Other abnormalities of gait and mobility  Other symptoms and signs involving the nervous system     Problem List Patient Active Problem List   Diagnosis Date Noted  . Pressure injury of skin 11/11/2019  . Anemia   . Constipation   . Neurogenic bladder   . Neurogenic bowel   . Quadriplegia ([place]) 10/19/2019  . 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 Epidural abscess 10/14/2019    Arliss Journey, PT, DPT  01/21/2020, 9:09 PM, PT, DPT   [facility] [address] [place], [place], [address] Phone: [phone]   Fax:  [phone]  Name: Ryan Reed MRN: [id number] Date of Birth: 26-Aug-1951

## 2020-01-22 ENCOUNTER — Encounter: Payer: Self-pay | Admitting: Physical Therapy

## 2020-01-22 NOTE — Therapy (Signed)
[facility] [address] [place], [place], [address] Phone: [phone]   Fax:  [phone]  Physical Therapy Treatment  Patient Details  Name: Ryan Reed MRN: [id number] Date of Birth: 10-Oct-1951 Referring Provider (PT): [name], MD   Encounter Date: 01/21/2020  PT End of Session - 01/22/20 1512    Visit Number  2    Number of Visits  25    Date for PT Re-Evaluation  04/20/20    Authorization Type  [employer] Medicare    PT Start Time  1402    PT Stop Time  1446    PT Time Calculation (min)  44 min    Equipment Utilized During Treatment  Gait belt;Other (comment)   Lt toe off AFO   Activity Tolerance  Patient tolerated treatment well    Behavior During Therapy  [facility] for tasks assessed/performed       History reviewed. No pertinent past medical history.  Past Surgical History:  Procedure Laterality Date  . LACERATION REPAIR Left    left thigh  . THORACIC LAMINECTOMY FOR EPIDURAL ABSCESS N/A 10/14/2019   Procedure: Cervical seven - THORACIC seven LAMINECTOMIES FOR EPIDURAL ABSCESS;  Surgeon: [name], MD;  Location: [facility];  Service: Neurosurgery;  Laterality: N/A;    There were no vitals filed for this visit.  Subjective Assessment - 01/22/20 1450    Subjective  Pt presents to PT in manual wheelchair accompanied by his wife - she reports pt is very motivated and determined    Patient is accompained by:  Family member   wife, coral   Pertinent History  hard of hearing, SCI (09/2019)    Patient Stated Goals  wants to walk again,    Currently in Pain?  Yes   reports more tightness than actual pain   Pain Score  3     Pain Location  --   low back and hips   Pain Orientation  Right;Left    Pain Descriptors / Indicators  Aching;Tightness;Discomfort    Pain Type  Neuropathic pain    Pain Onset  1 to 4 weeks ago    Pain Frequency  Intermittent    Aggravating Factors   no specific factors    Pain  Relieving Factors  stretching helps                       [facility] Adult PT Treatment/Exercise - 01/22/20 0001      Transfers   Transfers  Lateral/Scoot Transfers    Sit to Stand  4: Min guard    Sit to Stand Details (indicate cue type and reason)  high low mat table was raised approx. 6" to increase ease with transfer - pt pushed up from mat with bil. UE's    Stand to Sit  4: Min guard    Lateral/Scoot Transfers  4: Min guard   no sliding board used   Number of Reps  Other reps (comment)   2     Ambulation/Gait   Ambulation/Gait  Yes    Ambulation/Gait Assistance  3: Mod assist   +2 person present for safety   Ambulation/Gait Assistance Details  toe off AFO used on LLE    Ambulation Distance (Feet)  38 Feet   77' 2nd rep after seated rest period   Assistive device  Rolling walker    Gait Pattern  Decreased step length - right;Decreased step length - left;Decreased hip/knee flexion -  left;Trunk flexed;Decreased weight shift to left;Decreased dorsiflexion - left    Ambulation Surface  Level;Indoor      Neuro Re-ed    Neuro Re-ed Details   pt transferred from prone to quadruped position with min assist - performed weight shifting anteriorly/posteriorly with gentle rocking movement 10 reps - pt reported moderate to severe stretch in Lt quad with posterior weight shift       Exercises   Exercises  Knee/Hip      Knee/Hip Exercises: Stretches   Sports administrator  Both;5 reps   in quadruped position - shifting weight posteriorly to heels   Gastroc Stretch  Right;Left;1 rep;20 seconds   passively stretching each heel cord   Other Knee/Hip Stretches  hip flexor stretch in sidelying position - Rt and Lt sides    15 sec hold x 3 reps     Knee/Hip Exercises: Supine   Bridges  AROM;Both;1 set;10 reps   3 sec hold   Straight Leg Raises  AROM;Right;Left;1 set;10 reps    Other Supine Knee/Hip Exercises  hip abduction/adduction in hooklying with red theraband 10 reps each leg  separately     Other Supine Knee/Hip Exercises  hip flexion with knee flexed in hooklying position with red theraband 10 reps each       Knee/Hip Exercises: Prone   Hamstring Curl  1 set;10 reps   assistance with LLE               PT Short Term Goals - 01/22/20 1521      PT SHORT TERM GOAL #1   Title  Pt and pt's wife will be independent with initial HEP for LE stretching and strengthening. ALL STGS DUE 02/18/20    Time  4    Period  Weeks    Status  New    Target Date  02/18/20      PT SHORT TERM GOAL #2   Title  Pt will perform sit <> stand from a mat table with RW and min guard - with single UE on RW and other UE on mat table for incr safety.    Time  4    Period  Weeks    Status  New      PT SHORT TERM GOAL #3   Title  Pt will tolerate standing at countertop for at least 5 minutes with min guard in order to improve tolerance for ADLs.    Time  4    Period  Weeks    Status  New      PT SHORT TERM GOAL #4   Title  Pt will ambulate at least 115' with RW and min A in order to improve functional mobility.    Time  4    Period  Weeks    Status  New      PT SHORT TERM GOAL #5   Title  Pt will undergo further assessment of L foot up brace vs. L AFO in order to improve foot clearance during gait.    Time  4    Period  Weeks    Status  New      PT SHORT TERM GOAL #6   Title  Pt will perform stand pivot transfer from w/c <> mat table with RW and min A.    Time  4    Period  Weeks    Status  New        PT Long Term Goals - 01/22/20 1521  PT LONG TERM GOAL #1   Title  Pt and pt's wife will be independent with final HEP for LE stretching and strengthening. ALL LTGS DUE 04/14/20    Time  12    Period  Weeks    Status  New      PT LONG TERM GOAL #2   Title  Pt will ambulate at least 230' with RW and supervision in order to improve functional mobility.    Time  12    Period  Weeks    Status  New      PT LONG TERM GOAL #3   Title  Pt will undergo further  assessment of 5x sit <> stand from mat table when appropriate to determine functional LE strength, LTG to be written.    Time  12    Period  Weeks    Status  New      PT LONG TERM GOAL #4   Title  Pt will undergo further assessment of gait speed with RW when appropriate.    Time  12    Period  Weeks    Status  New      PT LONG TERM GOAL #5   Title  Pt will perform all transfers from manual w/c with mod I in order to improve independence.    Time  12    Period  Weeks    Status  New            Plan - 01/22/20 1513    Clinical Impression Statement  Pt did well with gait training with use of Lt toe off AFO and shoe cover on Lt shoe for reduced friction during swing through; shoe cover used on 2nd rep (77') and was not used on 1st rep of gait training (38').  Pt presents with LLE weaker than RLE and also increased spasticity in Lt quad noted compared to Rt quad.  Foot up brace was not trialed in today's session.    Personal Factors and Comorbidities  Past/Current Experience    Examination-Activity Limitations  Bathing;Stand;Squat;Locomotion Level;Bend;Hygiene/Grooming    Examination-Participation Restrictions  Cleaning;Community Activity;Yard Work    Rehab Potential  Good    PT Frequency  2x / week    PT Duration  12 weeks    PT Treatment/Interventions  ADLs/Self Care Home Management;Aquatic Therapy;Electrical Stimulation;DME Instruction;Gait training;Stair training;Functional mobility training;Neuromuscular re-education;Balance training;Therapeutic exercise;Therapeutic activities;Patient/family education;Orthotic Fit/Training;Passive range of motion    PT Next Visit Plan  Pt to bring exercises given to him by HHPT  - update HEP prn; gave red theraband for hip abdct. in hooklying and also for hip flexion in hooklying (please give pictures); -  add any additional that are appropriate. would pt be a good candidate for bioness?    PT Home Exercise Plan  gave hip flexion and abdct. in  hooklying with use of red theraband    Consulted and Agree with Plan of Care  Patient;Family member/caregiver    Family Member Consulted  pt's wife coral       Patient will benefit from skilled therapeutic intervention in order to improve the following deficits and impairments:  Abnormal gait, Decreased activity tolerance, Decreased balance, Decreased endurance, Decreased coordination, Decreased mobility, Decreased range of motion, Difficulty walking, Decreased strength, Impaired flexibility, Impaired sensation, Postural dysfunction, Pain  Visit Diagnosis: Muscle weakness (generalized)  Other abnormalities of gait and mobility  Other symptoms and signs involving the nervous system     Problem List Patient Active Problem List  Diagnosis Date Noted  . Pressure injury of skin 11/11/2019  . Anemia   . Constipation   . Neurogenic bladder   . Neurogenic bowel   . Quadriplegia ([place]) 10/19/2019  . 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 Epidural abscess 10/14/2019    Alda Lea, PT 01/22/2020, 3:23 PM  [facility] [address] [place], [place], [address] Phone: [phone]   Fax:  [phone]  Name: JAMIRE MACNAB MRN: [id number] Date of Birth: Dec 04, 1950

## 2020-01-23 DIAGNOSIS — R3914 Feeling of incomplete bladder emptying: Secondary | ICD-10-CM | POA: Diagnosis not present

## 2020-01-24 ENCOUNTER — Telehealth: Payer: Self-pay | Admitting: *Deleted

## 2020-01-24 NOTE — Telephone Encounter (Signed)
Patient left a message asking for a refill on Eliquis.  He will run out today.  He says he is not sure if he is supposed to continue it or not.  Hospital discharge note indicates, 'Bilateral lower extremity Dopplers ordered for work-up and showed bilateral gastroc DVTs and he was started on Eliquis for treatment.'

## 2020-01-25 ENCOUNTER — Other Ambulatory Visit: Payer: Self-pay | Admitting: Physical Medicine and Rehabilitation

## 2020-01-25 MED ORDER — APIXABAN 5 MG PO TABS: 5.0000 mg | ORAL_TABLET | Freq: Two times a day (BID) | ORAL | 1 refills | Status: AC

## 2020-01-27 ENCOUNTER — Other Ambulatory Visit: Payer: Self-pay

## 2020-01-27 ENCOUNTER — Ambulatory Visit: Payer: Medicare Other

## 2020-01-27 DIAGNOSIS — R262 Difficulty in walking, not elsewhere classified: Secondary | ICD-10-CM | POA: Diagnosis not present

## 2020-01-27 DIAGNOSIS — M21372 Foot drop, left foot: Secondary | ICD-10-CM | POA: Diagnosis not present

## 2020-01-27 DIAGNOSIS — M6281 Muscle weakness (generalized): Secondary | ICD-10-CM

## 2020-01-27 DIAGNOSIS — R2681 Unsteadiness on feet: Secondary | ICD-10-CM | POA: Diagnosis not present

## 2020-01-27 DIAGNOSIS — R2689 Other abnormalities of gait and mobility: Secondary | ICD-10-CM

## 2020-01-27 DIAGNOSIS — R293 Abnormal posture: Secondary | ICD-10-CM | POA: Diagnosis not present

## 2020-01-27 DIAGNOSIS — R29818 Other symptoms and signs involving the nervous system: Secondary | ICD-10-CM | POA: Diagnosis not present

## 2020-01-27 NOTE — Patient Instructions (Addendum)
Access Code: [email or web address] URL: [email or web address] Date: 01/27/2020 Prepared by: Cherly Anderson  Exercises Supine Bridge - 10 reps - 2 sets - 2x daily - 7x weekly Supine Knee to Chest with Leg Straight - 10 reps - 2 sets - 2x daily - 7x weekly Clamshell with Resistance - 10 reps - 2 sets - 2x daily - 7x weekly- resistance only on right to start

## 2020-01-27 NOTE — Therapy (Signed)
[facility] [address] [place], [place], [address] Phone: [phone]   Fax:  [phone]  Physical Therapy Treatment  Patient Details  Name: Ryan Reed MRN: [id number] Date of Birth: 1951-11-05 Referring Provider (PT): [name], MD   Encounter Date: 01/27/2020  PT End of Session - 01/27/20 1451    Visit Number  3    Number of Visits  25    Date for PT Re-Evaluation  04/20/20    Authorization Type  [employer] Medicare    PT Start Time  1446    PT Stop Time  1531    PT Time Calculation (min)  45 min    Equipment Utilized During Treatment  Gait belt;Other (comment)   Lt toe off AFO   Activity Tolerance  Patient tolerated treatment well    Behavior During Therapy  [facility] [place] for tasks assessed/performed       No past medical history on file.  Past Surgical History:  Procedure Laterality Date  . LACERATION REPAIR Left    left thigh  . THORACIC LAMINECTOMY FOR EPIDURAL ABSCESS N/A 10/14/2019   Procedure: Cervical seven - THORACIC seven LAMINECTOMIES FOR EPIDURAL ABSCESS;  Surgeon: [name], MD;  Location: [facility];  Service: Neurosurgery;  Laterality: N/A;    There were no vitals filed for this visit.  Subjective Assessment - 01/27/20 1450    Subjective  Pt reports that nerve pain in hips and feet is bothering him more today.    Patient is accompained by:  Family member   wife, coral   Pertinent History  hard of hearing, SCI (09/2019)    Patient Stated Goals  wants to walk again,    Currently in Pain?  Yes    Pain Location  Hip   and feet   Pain Descriptors / Indicators  Discomfort    Pain Type  Neuropathic pain    Pain Onset  1 to 4 weeks ago                       [facility] Adult PT Treatment/Exercise - 01/27/20 1451      Bed Mobility   Bed Mobility  Rolling Right;Rolling Left;Supine to Sit;Sit to Supine    Rolling Right  Independent    Rolling Left  Independent    Supine to Sit   Independent    Sit to Supine  Independent      Transfers   Transfers  Squat Pivot Transfers;Sit to Stand;Stand to Sit    Sit to Stand  4: Min guard    Stand to Sit  4: Min guard    Squat Pivot Transfers  4: Min guard      Ambulation/Gait   Ambulation/Gait  Yes    Ambulation/Gait Assistance  4: Min assist    Ambulation/Gait Assistance Details  Pt utilized left ottobock AFO. 1 episode of buckling at left knee requiring mod assist. Pt cued to shift weight to help with left foot clearance.     Ambulation Distance (Feet)  115 Feet    Assistive device  Rolling walker   left AFO, w/c follow   Gait Pattern  Decreased step length - left;Poor foot clearance - left;Decreased hip/knee flexion - left    Ambulation Surface  Level;Indoor      Neuro Re-ed    Neuro Re-ed Details   Quadruped maintaining position performing stabilizing reversals at hips lateral and sup/inf with 5 sec holds x 4 each direction.  Tall kneeling with UE support on bench with verbal and tactile cues to tighten gluts. Performed alternating hand lifts x 5 each side. Verbal cues for upright posture. Attempted partial squats in tall kneeling but patient having issues with left hamstring cramping so stopped.      Exercises   Exercises  Other Exercises    Other Exercises   Hooklying bridges 10 x 2 with verbal cues to try to raise left hip more with PT stabilizing at left hip, hooklying resisted unilateral bent knee fall out x 10 each side, hip flexion x 10 each leg with verbal cues to brace abdomen for support. Pt instructed to breath throughout exercises. Sidelying clamshell with manual resistance for right leg 10 x 2 then left without resistance 10 x 2. Pt was cued to not roll.  PT performed left hamstring stretch 30 sec x 2 in supine.             PT Education - 01/27/20 1830    Education Details  Pt was given initial HEP    Person(s) Educated  Patient;Spouse    Methods  Explanation;Handout;Demonstration    Comprehension   Verbalized understanding       PT Short Term Goals - 01/22/20 1521      PT SHORT TERM GOAL #1   Title  Pt and pt's wife will be independent with initial HEP for LE stretching and strengthening. ALL STGS DUE 02/18/20    Time  4    Period  Weeks    Status  New    Target Date  02/18/20      PT SHORT TERM GOAL #2   Title  Pt will perform sit <> stand from a mat table with RW and min guard - with single UE on RW and other UE on mat table for incr safety.    Time  4    Period  Weeks    Status  New      PT SHORT TERM GOAL #3   Title  Pt will tolerate standing at countertop for at least 5 minutes with min guard in order to improve tolerance for ADLs.    Time  4    Period  Weeks    Status  New      PT SHORT TERM GOAL #4   Title  Pt will ambulate at least 115' with RW and min A in order to improve functional mobility.    Time  4    Period  Weeks    Status  New      PT SHORT TERM GOAL #5   Title  Pt will undergo further assessment of L foot up brace vs. L AFO in order to improve foot clearance during gait.    Time  4    Period  Weeks    Status  New      PT SHORT TERM GOAL #6   Title  Pt will perform stand pivot transfer from w/c <> mat table with RW and min A.    Time  4    Period  Weeks    Status  New        PT Long Term Goals - 01/22/20 1521      PT LONG TERM GOAL #1   Title  Pt and pt's wife will be independent with final HEP for LE stretching and strengthening. ALL LTGS DUE 04/14/20    Time  12    Period  Weeks    Status  New      PT LONG TERM GOAL #2   Title  Pt will ambulate at least 230' with RW and supervision in order to improve functional mobility.    Time  12    Period  Weeks    Status  New      PT LONG TERM GOAL #3   Title  Pt will undergo further assessment of 5x sit <> stand from mat table when appropriate to determine functional LE strength, LTG to be written.    Time  12    Period  Weeks    Status  New      PT LONG TERM GOAL #4   Title  Pt will  undergo further assessment of gait speed with RW when appropriate.    Time  12    Period  Weeks    Status  New      PT LONG TERM GOAL #5   Title  Pt will perform all transfers from manual w/c with mod I in order to improve independence.    Time  12    Period  Weeks    Status  New            Plan - 01/27/20 1831    Clinical Impression Statement  Pt was able to progress gait distance at visit today with left ottobock AFO. Only 1 episode of left knee buckling. Did have some difficulty with left foot clearance. Pt was able to attain tall kneeling and did well with upright posture but limited with a lot of activities due to left hamstring cramping. Progressed to sidelying clamshell from hooklying.    Personal Factors and Comorbidities  Past/Current Experience    Examination-Activity Limitations  Bathing;Stand;Squat;Locomotion Level;Bend;Hygiene/Grooming    Examination-Participation Restrictions  Cleaning;Community Activity;Yard Work    Rehab Potential  Good    PT Frequency  2x / week    PT Duration  12 weeks    PT Treatment/Interventions  ADLs/Self Care Home Management;Aquatic Therapy;Electrical Stimulation;DME Instruction;Gait training;Stair training;Functional mobility training;Neuromuscular re-education;Balance training;Therapeutic exercise;Therapeutic activities;Patient/family education;Orthotic Fit/Training;Passive range of motion    PT Next Visit Plan  Continue with tall kneeling for strengthening, strengthening progression. Transfers. Gait training with left AFO with w/c follow. BWS over treadmill?    Consulted and Agree with Plan of Care  Patient;Family member/caregiver    Family Member Consulted  pt's wife coral       Patient will benefit from skilled therapeutic intervention in order to improve the following deficits and impairments:  Abnormal gait, Decreased activity tolerance, Decreased balance, Decreased endurance, Decreased coordination, Decreased mobility, Decreased range  of motion, Difficulty walking, Decreased strength, Impaired flexibility, Impaired sensation, Postural dysfunction, Pain  Visit Diagnosis: Muscle weakness (generalized)  Other abnormalities of gait and mobility     Problem List Patient Active Problem List   Diagnosis Date Noted  . Pressure injury of skin 11/11/2019  . Anemia   . Constipation   . Neurogenic bladder   . Neurogenic bowel   . Quadriplegia ([place]) 10/19/2019  . 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 Epidural abscess 10/14/2019    Electa Sniff, PT, DPT, NCS 01/27/2020, 6:37 PM  [facility] [address] [place], [place], [address] Phone: [phone]   Fax:  [phone]  Name: Ryan Reed MRN: [id number] Date of Birth: 1951-11-07

## 2020-01-27 NOTE — Telephone Encounter (Signed)
Refill was sent, thank you!

## 2020-01-28 DIAGNOSIS — N130 Hydronephrosis with ureteropelvic junction obstruction: Secondary | ICD-10-CM | POA: Diagnosis not present

## 2020-01-30 ENCOUNTER — Ambulatory Visit: Payer: Medicare Other | Admitting: Physical Therapy

## 2020-01-30 ENCOUNTER — Other Ambulatory Visit: Payer: Self-pay

## 2020-01-30 DIAGNOSIS — R2689 Other abnormalities of gait and mobility: Secondary | ICD-10-CM

## 2020-01-30 DIAGNOSIS — R29818 Other symptoms and signs involving the nervous system: Secondary | ICD-10-CM | POA: Diagnosis not present

## 2020-01-30 DIAGNOSIS — R293 Abnormal posture: Secondary | ICD-10-CM | POA: Diagnosis not present

## 2020-01-30 DIAGNOSIS — M6281 Muscle weakness (generalized): Secondary | ICD-10-CM

## 2020-01-30 DIAGNOSIS — R2681 Unsteadiness on feet: Secondary | ICD-10-CM | POA: Diagnosis not present

## 2020-01-30 DIAGNOSIS — M21372 Foot drop, left foot: Secondary | ICD-10-CM | POA: Diagnosis not present

## 2020-01-30 DIAGNOSIS — R262 Difficulty in walking, not elsewhere classified: Secondary | ICD-10-CM | POA: Diagnosis not present

## 2020-01-31 ENCOUNTER — Encounter: Payer: Self-pay | Admitting: Physical Therapy

## 2020-01-31 DIAGNOSIS — G8250 Quadriplegia, unspecified: Secondary | ICD-10-CM | POA: Diagnosis not present

## 2020-01-31 NOTE — Therapy (Signed)
[facility] [address] [place], [place], [address] Phone: [phone]   Fax:  [phone]  Physical Therapy Treatment  Patient Details  Name: Ryan Reed MRN: [id number] Date of Birth: 03-30-1951 Referring Provider (PT): [name], MD   Encounter Date: 01/30/2020  PT End of Session - 01/31/20 1159    Visit Number  4    Number of Visits  25    Date for PT Re-Evaluation  04/20/20    Authorization Type  [employer] Medicare    PT Start Time  1229    PT Stop Time  1315    PT Time Calculation (min)  46 min    Equipment Utilized During Treatment  Gait belt;Other (comment)   Blue rocker AFO & shoe cover   Activity Tolerance  Patient tolerated treatment well    Behavior During Therapy  [facility] for tasks assessed/performed       History reviewed. No pertinent past medical history.  Past Surgical History:  Procedure Laterality Date  . LACERATION REPAIR Left    left thigh  . THORACIC LAMINECTOMY FOR EPIDURAL ABSCESS N/A 10/14/2019   Procedure: Cervical seven - THORACIC seven LAMINECTOMIES FOR EPIDURAL ABSCESS;  Surgeon: [name], MD;  Location: [facility];  Service: Neurosurgery;  Laterality: N/A;    There were no vitals filed for this visit.  Subjective Assessment - 01/31/20 1137    Subjective  Pt reports he may start taking the Gabapentin to help his nerve pain    Patient is accompained by:  Family member   wife [name]   Pertinent History  hard of hearing, SCI (09/2019)    Currently in Pain?  Yes    Pain Score  --   nerve pain in feet   Pain Location  Foot    Pain Orientation  Right;Left    Pain Descriptors / Indicators  Burning;Tingling;Discomfort    Pain Type  Neuropathic pain    Pain Onset  More than a month ago    Pain Frequency  Intermittent                       [facility] Adult PT Treatment/Exercise - 01/31/20 0001      Transfers   Transfers  Squat Pivot Transfers    Sit to Stand  4:  Min assist;From elevated surface   from high/low mat table   Stand to Sit  4: Min assist    Squat Pivot Transfers  4: Min guard      Ambulation/Gait   Ambulation/Gait  Yes    Ambulation/Gait Assistance  4: Min assist   tech present for safety   Ambulation/Gait Assistance Details  Blue Rocker AFO and shoe cover used on LLE    Ambulation Distance (Feet)  140 Feet    Assistive device  Rolling walker    Gait Pattern  Decreased step length - right;Decreased step length - left;Decreased hip/knee flexion - left;Trunk flexed;Decreased weight shift to left;Decreased dorsiflexion - left    Ambulation Surface  Level;Indoor    Stairs  Yes    Stairs Assistance  3: Mod assist    Stair Management Technique  Two rails;Step to pattern    Number of Stairs  4    Height of Stairs  6      Knee/Hip Exercises: Stretches   Other Knee/Hip Stretches  hip flexor stretch in sidelying position - Rt and Lt sides    15 sec hold x 3 reps  Knee/Hip Exercises: Aerobic   Recumbent Bike  SciFit level 2.0 x 3" with LE's only after 1"       Knee/Hip Exercises: Supine   Bridges  AROM;Both;1 set;10 reps   3 sec hold   Single Leg Bridge  AROM;Right;Left;1 set;10 reps    Straight Leg Raises  AROM;Right;Left;1 set;10 reps    Other Supine Knee/Hip Exercises  hip abduction/adduction in hooklying with red theraband 10 reps each leg separately     Other Supine Knee/Hip Exercises  hip flexion with knee flexed in hooklying position with red theraband 10 reps each       Knee/Hip Exercises: Prone   Hamstring Curl  1 set;10 reps   assistance with LLE    Other Prone Exercises  Rt and Lt hip extension with knee flexed at 90 degrees - mod assist with RLE and max assist for LLE               PT Short Term Goals - 01/31/20 1205      PT SHORT TERM GOAL #1   Title  Pt and pt's wife will be independent with initial HEP for LE stretching and strengthening. ALL STGS DUE 02/18/20    Time  4    Period  Weeks    Status   New    Target Date  02/18/20      PT SHORT TERM GOAL #2   Title  Pt will perform sit <> stand from a mat table with RW and min guard - with single UE on RW and other UE on mat table for incr safety.    Time  4    Period  Weeks    Status  New      PT SHORT TERM GOAL #3   Title  Pt will tolerate standing at countertop for at least 5 minutes with min guard in order to improve tolerance for ADLs.    Time  4    Period  Weeks    Status  New      PT SHORT TERM GOAL #4   Title  Pt will ambulate at least 115' with RW and min A in order to improve functional mobility.    Time  4    Period  Weeks    Status  New      PT SHORT TERM GOAL #5   Title  Pt will undergo further assessment of L foot up brace vs. L AFO in order to improve foot clearance during gait.    Time  4    Period  Weeks    Status  New      PT SHORT TERM GOAL #6   Title  Pt will perform stand pivot transfer from w/c <> mat table with RW and min A.    Time  4    Period  Weeks    Status  New        PT Long Term Goals - 01/31/20 1206      PT LONG TERM GOAL #1   Title  Pt and pt's wife will be independent with final HEP for LE stretching and strengthening. ALL LTGS DUE 04/14/20    Time  12    Period  Weeks    Status  New      PT LONG TERM GOAL #2   Title  Pt will ambulate at least 230' with RW and supervision in order to improve functional mobility.    Time  12  Period  Weeks    Status  New      PT LONG TERM GOAL #3   Title  Pt will undergo further assessment of 5x sit <> stand from mat table when appropriate to determine functional LE strength, LTG to be written.    Time  12    Period  Weeks    Status  New      PT LONG TERM GOAL #4   Title  Pt will undergo further assessment of gait speed with RW when appropriate.    Time  12    Period  Weeks    Status  New      PT LONG TERM GOAL #5   Title  Pt will perform all transfers from manual w/c with mod I in order to improve independence.    Time  12    Period   Weeks    Status  New            Plan - 01/31/20 1200    Clinical Impression Statement  Pt is progressing well with gait - Blue Rocker AFO works very well in providing Lt knee stability to reduce buckling and also provides foot clearance in swing phase of gait with shoe cover beneficial in reducing friction in swing through.  Pt increased amb. distance from 115' to 140' during today's session.    Personal Factors and Comorbidities  Past/Current Experience    Examination-Activity Limitations  Bathing;Stand;Squat;Locomotion Level;Bend;Hygiene/Grooming    Examination-Participation Restrictions  Cleaning;Community Activity;Yard Work    Rehab Potential  Good    PT Frequency  2x / week    PT Duration  12 weeks    PT Treatment/Interventions  ADLs/Self Care Home Management;Aquatic Therapy;Electrical Stimulation;DME Instruction;Gait training;Stair training;Functional mobility training;Neuromuscular re-education;Balance training;Therapeutic exercise;Therapeutic activities;Patient/family education;Orthotic Fit/Training;Passive range of motion    PT Next Visit Plan  Continue with tall kneeling for strengthening, strengthening progression. Transfers. Gait training with left Blue Rocker    Consulted and Agree with Plan of Care  Patient;Family member/caregiver    Family Member Consulted  pt's wife [name]       Patient will benefit from skilled therapeutic intervention in order to improve the following deficits and impairments:  Abnormal gait, Decreased activity tolerance, Decreased balance, Decreased endurance, Decreased coordination, Decreased mobility, Decreased range of motion, Difficulty walking, Decreased strength, Impaired flexibility, Impaired sensation, Postural dysfunction, Pain  Visit Diagnosis: Muscle weakness (generalized)  Other abnormalities of gait and mobility  Other symptoms and signs involving the nervous system     Problem List Patient Active Problem List   Diagnosis Date  Noted  . Pressure injury of skin 11/11/2019  . Anemia   . Constipation   . Neurogenic bladder   . Neurogenic bowel   . Quadriplegia ([place]) 10/19/2019  . 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 Epidural abscess 10/14/2019    Alda Lea, PT 01/31/2020, 12:07 PM  [facility] [address] [place], [place], [address] Phone: [phone]   Fax:  [phone]  Name: Ryan Reed MRN: [id number] Date of Birth: 11/23/1950

## 2020-02-03 DIAGNOSIS — R339 Retention of urine, unspecified: Secondary | ICD-10-CM | POA: Diagnosis not present

## 2020-02-03 DIAGNOSIS — N319 Neuromuscular dysfunction of bladder, unspecified: Secondary | ICD-10-CM | POA: Diagnosis not present

## 2020-02-03 DIAGNOSIS — G062 Extradural and subdural abscess, unspecified: Secondary | ICD-10-CM | POA: Diagnosis not present

## 2020-02-03 DIAGNOSIS — G8250 Quadriplegia, unspecified: Secondary | ICD-10-CM | POA: Diagnosis not present

## 2020-02-03 DIAGNOSIS — G8220 Paraplegia, unspecified: Secondary | ICD-10-CM | POA: Diagnosis not present

## 2020-02-04 ENCOUNTER — Ambulatory Visit: Payer: Medicare Other | Admitting: Physical Therapy

## 2020-02-04 ENCOUNTER — Other Ambulatory Visit: Payer: Self-pay

## 2020-02-04 DIAGNOSIS — R2689 Other abnormalities of gait and mobility: Secondary | ICD-10-CM

## 2020-02-04 DIAGNOSIS — R2681 Unsteadiness on feet: Secondary | ICD-10-CM | POA: Diagnosis not present

## 2020-02-04 DIAGNOSIS — M21372 Foot drop, left foot: Secondary | ICD-10-CM | POA: Diagnosis not present

## 2020-02-04 DIAGNOSIS — R262 Difficulty in walking, not elsewhere classified: Secondary | ICD-10-CM | POA: Diagnosis not present

## 2020-02-04 DIAGNOSIS — R29818 Other symptoms and signs involving the nervous system: Secondary | ICD-10-CM

## 2020-02-04 DIAGNOSIS — R293 Abnormal posture: Secondary | ICD-10-CM | POA: Diagnosis not present

## 2020-02-04 DIAGNOSIS — M6281 Muscle weakness (generalized): Secondary | ICD-10-CM | POA: Diagnosis not present

## 2020-02-05 ENCOUNTER — Encounter: Payer: Self-pay | Admitting: Physical Therapy

## 2020-02-05 NOTE — Therapy (Signed)
[facility] [address] [place], [place], [address] Phone: [phone]   Fax:  [phone]  Physical Therapy Treatment  Patient Details  Name: Ryan Reed MRN: [id number] Date of Birth: Jul 22, 1951 Referring Provider (PT): [name], MD   Encounter Date: 02/04/2020  PT End of Session - 02/05/20 1524    Visit Number  5    Number of Visits  25    Date for PT Re-Evaluation  04/20/20    Authorization Type  [employer] Medicare    PT Start Time  1450    PT Stop Time  1533    PT Time Calculation (min)  43 min    Equipment Utilized During Treatment  Gait belt;Other (comment)   Blue rocker AFO & shoe cover   Activity Tolerance  Patient tolerated treatment well    Behavior During Therapy  WFL for tasks assessed/performed       History reviewed. No pertinent past medical history.  Past Surgical History:  Procedure Laterality Date  . LACERATION REPAIR Left    left thigh  . THORACIC LAMINECTOMY FOR EPIDURAL ABSCESS N/A 10/14/2019   Procedure: Cervical seven - THORACIC seven LAMINECTOMIES FOR EPIDURAL ABSCESS;  Surgeon: [name], MD;  Location: [facility];  Service: Neurosurgery;  Laterality: N/A;    There were no vitals filed for this visit.  Subjective Assessment - 02/05/20 1514    Subjective  Pt arrives in his new manual wheelchair  - states he likes it; pt reports standing at counter daily (sometimes 2-3 x/day)  for at least 5 minutes    Patient is accompained by:  Family member   wife [name]   Pertinent History  hard of hearing, SCI (09/2019)    Currently in Pain?  Yes    Pain Score  --   nerve pain in feet   Pain Orientation  Right;Left    Pain Descriptors / Indicators  Burning;Tingling;Discomfort    Pain Type  Neuropathic pain    Pain Onset  More than a month ago    Pain Frequency  Intermittent                       [facility] Adult PT Treatment/Exercise - 02/05/20 0001      Transfers    Transfers  Squat Pivot Transfers    Sit to Stand  4: Min assist    Stand to Sit  4: Min Nurse, children's Transfers  4: Min guard      Ambulation/Gait   Ambulation/Gait  Yes    Ambulation/Gait Assistance  4: Min assist   tech present for safety   Ambulation/Gait Assistance Details  Blue Rocker AFO used on LLE with blue shoe cover for 115' then shoe cover removed for approx. 69' for assessment of Lt foot clearance in swing phase     Ambulation Distance (Feet)  130 Feet    Assistive device  Rolling walker    Gait Pattern  Decreased step length - right;Decreased step length - left;Decreased hip/knee flexion - left;Trunk flexed;Decreased weight shift to left;Decreased dorsiflexion - left    Ambulation Surface  Level;Indoor      High Level Balance   High Level Balance Activities  Side stepping    High Level Balance Comments  pt performed sidestepping at counter 15' x 4 reps with bil. UE support on counter with min assist       Knee/Hip Exercises: Aerobic   Recumbent  Bike  SciFit level 2.0 x 3" with LE's only       Knee/Hip Exercises: Machines for Strengthening   Cybex Leg Press  40# bil. LE's 2 sets 10 reps      Knee/Hip Exercises: Supine   Bridges  AROM;Both;1 set;10 reps   3 sec hold   Straight Leg Raises  AROM;Right;Left;1 set;10 reps    Other Supine Knee/Hip Exercises  pt performed bridging with hip abdct/adduction 5 reps; bridging with LE extension 5 reps each leg    Other Supine Knee/Hip Exercises  hip extension control exercise off side of mat 10 reps each leg       Knee/Hip Exercises: Sidelying   Hip ABduction  AAROM;Right;Left;1 set;10 reps    Other Sidelying Knee/Hip Exercises  Rt & Lt hip flexion/extension with manual moderate resistance 10 reps each      Knee/Hip Exercises: Prone   Hamstring Curl  1 set;10 reps   assistance with LLE    Other Prone Exercises  Rt and Lt hip extension with knee flexed at 90 degrees - mod assist with RLE and max assist for LLE              PT Education - 02/05/20 1523    Education Details  added sidestepping at counter to [employer]) Educated  Spouse;Patient    Methods  Explanation;Demonstration    Comprehension  Verbalized understanding;Returned demonstration       PT Short Term Goals - 02/05/20 1528      PT SHORT TERM GOAL #1   Title  Pt and pt's wife will be independent with initial HEP for LE stretching and strengthening. ALL STGS DUE 02/18/20    Time  4    Period  Weeks    Status  New    Target Date  02/18/20      PT SHORT TERM GOAL #2   Title  Pt will perform sit <> stand from a mat table with RW and min guard - with single UE on RW and other UE on mat table for incr safety.    Time  4    Period  Weeks    Status  New      PT SHORT TERM GOAL #3   Title  Pt will tolerate standing at countertop for at least 5 minutes with min guard in order to improve tolerance for ADLs.    Time  4    Period  Weeks    Status  New      PT SHORT TERM GOAL #4   Title  Pt will ambulate at least 115' with RW and min A in order to improve functional mobility.    Time  4    Period  Weeks    Status  New      PT SHORT TERM GOAL #5   Title  Pt will undergo further assessment of L foot up brace vs. L AFO in order to improve foot clearance during gait.    Time  4    Period  Weeks    Status  New      PT SHORT TERM GOAL #6   Title  Pt will perform stand pivot transfer from w/c <> mat table with RW and min A.    Time  4    Period  Weeks    Status  New        PT Long Term Goals - 02/05/20 1528      PT  LONG TERM GOAL #1   Title  Pt and pt's wife will be independent with final HEP for LE stretching and strengthening. ALL LTGS DUE 04/14/20    Time  12    Period  Weeks    Status  New      PT LONG TERM GOAL #2   Title  Pt will ambulate at least 230' with RW and supervision in order to improve functional mobility.    Time  12    Period  Weeks    Status  New      PT LONG TERM GOAL #3   Title  Pt will  undergo further assessment of 5x sit <> stand from mat table when appropriate to determine functional LE strength, LTG to be written.    Time  12    Period  Weeks    Status  New      PT LONG TERM GOAL #4   Title  Pt will undergo further assessment of gait speed with RW when appropriate.    Time  12    Period  Weeks    Status  New      PT LONG TERM GOAL #5   Title  Pt will perform all transfers from manual w/c with mod I in order to improve independence.    Time  12    Period  Weeks    Status  New            Plan - 02/05/20 1525    Clinical Impression Statement  Pt continues to progress well towards goals; continues to need blue shoe cover in addition to Blue Rocker AFO for LLE foot clearance in swing phase of gait.  Pt unable to actively flex Lt knee in swing phase and uses hip hike to achieve LLE swing through.  Pt performed leg press exercise for first time today.    Personal Factors and Comorbidities  Past/Current Experience    Examination-Activity Limitations  Bathing;Stand;Squat;Locomotion Level;Bend;Hygiene/Grooming    Examination-Participation Restrictions  Cleaning;Community Activity;Yard Work    Rehab Potential  Good    PT Frequency  2x / week    PT Duration  12 weeks    PT Treatment/Interventions  ADLs/Self Care Home Management;Aquatic Therapy;Electrical Stimulation;DME Instruction;Gait training;Stair training;Functional mobility training;Neuromuscular re-education;Balance training;Therapeutic exercise;Therapeutic activities;Patient/family education;Orthotic Fit/Training;Passive range of motion    PT Next Visit Plan  Continue with tall kneeling for strengthening, strengthening progression. Transfers. Gait training with left Blue Rocker    Consulted and Agree with Plan of Care  Patient;Family member/caregiver    Family Member Consulted  pt's wife [name]       Patient will benefit from skilled therapeutic intervention in order to improve the following deficits and  impairments:  Abnormal gait, Decreased activity tolerance, Decreased balance, Decreased endurance, Decreased coordination, Decreased mobility, Decreased range of motion, Difficulty walking, Decreased strength, Impaired flexibility, Impaired sensation, Postural dysfunction, Pain  Visit Diagnosis: Other abnormalities of gait and mobility  Other symptoms and signs involving the nervous system  Muscle weakness (generalized)  Unsteadiness on feet     Problem List Patient Active Problem List   Diagnosis Date Noted  . Pressure injury of skin 11/11/2019  . Anemia   . Constipation   . Neurogenic bladder   . Neurogenic bowel   . Quadriplegia ([place]) 10/19/2019  . 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 Epidural abscess 10/14/2019    Alda Lea, PT 02/05/2020, 3:30 PM  [facility]  [place], [place], [address] Phone: [phone]   Fax:  [phone]  Name: JAXTEN SOMMERFIELD MRN: [id number] Date of Birth: [birth year]

## 2020-02-06 ENCOUNTER — Ambulatory Visit: Payer: Medicare Other | Admitting: Physical Therapy

## 2020-02-10 ENCOUNTER — Encounter: Payer: Self-pay | Admitting: Physical Therapy

## 2020-02-10 ENCOUNTER — Other Ambulatory Visit: Payer: Self-pay

## 2020-02-10 ENCOUNTER — Telehealth: Payer: Self-pay | Admitting: Physical Therapy

## 2020-02-10 ENCOUNTER — Ambulatory Visit: Payer: Medicare Other | Admitting: Physical Therapy

## 2020-02-10 DIAGNOSIS — M21372 Foot drop, left foot: Secondary | ICD-10-CM | POA: Diagnosis not present

## 2020-02-10 DIAGNOSIS — M6281 Muscle weakness (generalized): Secondary | ICD-10-CM | POA: Diagnosis not present

## 2020-02-10 DIAGNOSIS — R262 Difficulty in walking, not elsewhere classified: Secondary | ICD-10-CM | POA: Diagnosis not present

## 2020-02-10 DIAGNOSIS — R29818 Other symptoms and signs involving the nervous system: Secondary | ICD-10-CM

## 2020-02-10 DIAGNOSIS — R2689 Other abnormalities of gait and mobility: Secondary | ICD-10-CM | POA: Diagnosis not present

## 2020-02-10 DIAGNOSIS — R2681 Unsteadiness on feet: Secondary | ICD-10-CM | POA: Diagnosis not present

## 2020-02-10 DIAGNOSIS — R293 Abnormal posture: Secondary | ICD-10-CM | POA: Diagnosis not present

## 2020-02-10 DIAGNOSIS — G8250 Quadriplegia, unspecified: Secondary | ICD-10-CM | POA: Diagnosis not present

## 2020-02-10 NOTE — Therapy (Signed)
[facility] [address] [place], [place], [address] Phone: [phone]   Fax:  [phone]  Physical Therapy Treatment  Patient Details  Name: Ryan Reed MRN: [id number] Date of Birth: 11/09/1951 Referring Provider (PT): [name], MD   Encounter Date: 02/10/2020  PT End of Session - 02/10/20 2034    Visit Number  6    Number of Visits  25    Date for PT Re-Evaluation  04/20/20    Authorization Type  [employer]HC Medicare    PT Start Time  1106    PT Stop Time  1152    PT Time Calculation (min)  46 min    Equipment Utilized During Treatment  Gait belt;Other (comment)   Left Blue rocker AFO & shoe cover   Activity Tolerance  Patient tolerated treatment well    Behavior During Therapy  [facility] for tasks assessed/performed       History reviewed. No pertinent past medical history.  Past Surgical History:  Procedure Laterality Date  . LACERATION REPAIR Left    left thigh  . THORACIC LAMINECTOMY FOR EPIDURAL ABSCESS N/A 10/14/2019   Procedure: Cervical seven - THORACIC seven LAMINECTOMIES FOR EPIDURAL ABSCESS;  Surgeon: [name], MD;  Location: [facility];  Service: Neurosurgery;  Laterality: N/A;    There were no vitals filed for this visit.  Subjective Assessment - 02/10/20 2020    Subjective  Pt states he is standing 1-2x/day and did some sidestepping along counter over the weekend.  Pt reports he cancelled appt last Thursday because "it was not a good day" (states he was having alot of nerve pain)    Patient is accompained by:  Family member   wife [name]   Pertinent History  hard of hearing, SCI (09/2019)    Patient Stated Goals  wants to walk again,    Currently in Pain?  Yes    Pain Score  4     Pain Location  Foot    Pain Orientation  Right;Left    Pain Descriptors / Indicators  Burning;Tingling;Discomfort    Pain Type  Neuropathic pain    Pain Onset  More than a month ago    Pain Frequency   Intermittent                       [facility] Adult PT Treatment/Exercise - 02/10/20 1119      Transfers   Transfers  Sit to [employer]    Sit to Stand  4: Min assist   mat to RW   Stand to Sit  4: Min Nurse, children's Transfers  4: Min guard      Ambulation/Gait   Ambulation/Gait  Yes    Ambulation/Gait Assistance  4: Min assist    Ambulation/Gait Assistance Details  Blue Rocker AFO used with blue shoe cover    Ambulation Distance (Feet)  230 Feet    Assistive device  Rolling walker    Gait Pattern  Decreased step length - right;Decreased step length - left;Decreased hip/knee flexion - left;Trunk flexed;Decreased weight shift to left;Decreased dorsiflexion - left    Ambulation Surface  Level;Indoor      Neuro Re-ed    Neuro Re-ed Details   Pt able to transfer from prone to quadruped - performed stretching  of quads by gently sitting back toward heels; transferred from quadruped to tall kneeling with pt's hands on PT's shoulders - performed small  mini squats with cues to squeeze gluts with return to upright position - 10 reps       Knee/Hip Exercises: Stretches   Passive Hamstring Stretch  Both;20 seconds;2 reps   1 rep pt supine; 1 rep pt seated   Hip Flexor Stretch  Both;1 rep;20 seconds   in sidelying position - RLE & LLE     Knee/Hip Exercises: Aerobic   Recumbent Bike  SciFit level 2.5 x 3" with LE's only      Knee/Hip Exercises: Supine   Bridges  AROM;Both;5 reps    Bridges with Clamshell  AROM;Both;1 set;5 reps    Straight Leg Raises  AROM;Right;Left;1 set;10 reps    Other Supine Knee/Hip Exercises  pt performed bridging with marching 5 reps; bridging with LE extension 5 reps each leg    Other Supine Knee/Hip Exercises  hip extension control exercise off side of mat 10 reps each leg       Knee/Hip Exercises: Sidelying   Hip ABduction  AAROM;Both;1 set;10 reps    Other Sidelying Knee/Hip Exercises  Rt & Lt hip flexion/extension with  manual moderate resistance 10 reps each      Knee/Hip Exercises: Prone   Hamstring Curl  1 set;10 reps   assistance with LLE    Other Prone Exercises  Rt and Lt hip extension with knee flexed at 90 degrees - mod assist with RLE and max assist for LLE             PT Education - 02/10/20 2003    Education Details  Medbridge - Access Code: [email or web address]: [email or web address]: 03/22/2021Prepared by: Vaughan Basta DildayExercisesBridge with Hip Abduction and Resistance - 1 x daily - 7 x weekly - 1 sets - 10 repsMarching Bridge - 1 x daily - 7 x weekly - 1 sets - 10 repsStraight Leg Raise - 1 x daily - 7 x weekly - 1 sets - 10 repsHIP EXTENSION CONTROL EXERCISE - 1 x daily - 7 x weekly - 1 sets - 10 repsSidelying Hip Abduction - 1 x daily - 7 x weekly - 1 sets - 10 repsProne Knee Flexion Extension AROM - 1 x daily - 7 x weekly - 3 sets - 10 repsProne Hip Extension with Bent Knee - 1 x daily - 7 x weekly - 1 sets - 10 reps    Person(s) Educated  Patient;Spouse    Methods  Explanation;Demonstration;Handout    Comprehension  Verbalized understanding;Returned demonstration       PT Short Term Goals - 02/10/20 2047      PT SHORT TERM GOAL #1   Title  Pt and pt's wife will be independent with initial HEP for LE stretching and strengthening. ALL STGS DUE 02/18/20    Time  4    Period  Weeks    Status  New    Target Date  02/18/20      PT SHORT TERM GOAL #2   Title  Pt will perform sit <> stand from a mat table with RW and min guard - with single UE on RW and other UE on mat table for incr safety.    Time  4    Period  Weeks    Status  New      PT SHORT TERM GOAL #3   Title  Pt will tolerate standing at countertop for at least 5 minutes with min guard in order to improve tolerance for ADLs.    Time  4    Period  Weeks  Status  New      PT SHORT TERM GOAL #4   Title  Pt will ambulate at least 115' with RW and min A in order to improve functional mobility.    Time  4     Period  Weeks    Status  New      PT SHORT TERM GOAL #5   Title  Pt will undergo further assessment of L foot up brace vs. L AFO in order to improve foot clearance during gait.    Time  4    Period  Weeks    Status  New      PT SHORT TERM GOAL #6   Title  Pt will perform stand pivot transfer from w/c <> mat table with RW and min A.    Time  4    Period  Weeks    Status  New        PT Long Term Goals - 02/10/20 2048      PT LONG TERM GOAL #1   Title  Pt and pt's wife will be independent with final HEP for LE stretching and strengthening. ALL LTGS DUE 04/14/20    Time  12    Period  Weeks    Status  New      PT LONG TERM GOAL #2   Title  Pt will ambulate at least 230' with RW and supervision in order to improve functional mobility.    Time  12    Period  Weeks    Status  New      PT LONG TERM GOAL #3   Title  Pt will undergo further assessment of 5x sit <> stand from mat table when appropriate to determine functional LE strength, LTG to be written.    Time  12    Period  Weeks    Status  New      PT LONG TERM GOAL #4   Title  Pt will undergo further assessment of gait speed with RW when appropriate.    Time  12    Period  Weeks    Status  New      PT LONG TERM GOAL #5   Title  Pt will perform all transfers from manual w/c with mod I in order to improve independence.    Time  12    Period  Weeks    Status  New            Plan - 02/10/20 2002    Clinical Impression Statement  Pt amb. furthest distance today (230' - 2 consecutive laps) with no rest breaks.  Pt continues to hike Lt hip to perform swing through due to minimal left isolated knee flexion in swing.  LLE remains weaker than RLE.    Personal Factors and Comorbidities  Past/Current Experience    Examination-Activity Limitations  Bathing;Stand;Squat;Locomotion Level;Bend;Hygiene/Grooming    Examination-Participation Restrictions  Cleaning;Community Activity;Yard Work    Rehab Potential  Good    PT  Frequency  2x / week    PT Duration  12 weeks    PT Treatment/Interventions  ADLs/Self Care Home Management;Aquatic Therapy;Electrical Stimulation;DME Instruction;Gait training;Stair training;Functional mobility training;Neuromuscular re-education;Balance training;Therapeutic exercise;Therapeutic activities;Patient/family education;Orthotic Fit/Training;Passive range of motion    PT Next Visit Plan  Continue with tall kneeling for strengthening, strengthening progression.  Gait training with left Blue Rocker AFO and shoe cover    [place] [id number] Code: [email or web address]: [email or web address]: 03/22/2021Prepared by: Vaughan Basta DildayExercisesBridge with  Hip Abduction and Resistance - 1 x daily - 7 x weekly - 1 sets - 10 repsMarching Bridge - 1 x daily - 7 x weekly - 1 sets - 10 repsStraight Leg Raise - 1 x daily - 7 x weekly - 1 sets - 10 repsHIP EXTENSION CONTROL EXERCISE - 1 x daily - 7 x weekly - 1 sets - 10 repsSidelying Hip Abduction - 1 x daily - 7 x weekly - 1 sets - 10 repsProne Knee Flexion Extension AROM - 1 x daily - 7 x weekly - 3 sets - 10 repsProne Hip Extension with Bent Knee - 1 x daily - 7 x weekly - 1 sets - 10 reps    Recommended Other Services  sent telephone inbasket request to Dr. Ronnald Ramp for AFO order (02-10-20)    Consulted and Agree with Plan of Care  Patient;Family member/caregiver    Family Member Consulted  pt's wife [name]       Patient will benefit from skilled therapeutic intervention in order to improve the following deficits and impairments:  Abnormal gait, Decreased activity tolerance, Decreased balance, Decreased endurance, Decreased coordination, Decreased mobility, Decreased range of motion, Difficulty walking, Decreased strength, Impaired flexibility, Impaired sensation, Postural dysfunction, Pain  Visit Diagnosis: Other abnormalities of gait and mobility  Muscle weakness (generalized)  Other symptoms and signs involving the  nervous system     Problem List Patient Active Problem List   Diagnosis Date Noted  . Pressure injury of skin 11/11/2019  . Anemia   . Constipation   . Neurogenic bladder   . Neurogenic bowel   . Quadriplegia ([place]) 10/19/2019  . 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 Epidural abscess 10/14/2019    Alda Lea, PT 02/10/2020, 8:49 PM  [facility] [address] [place], [place], [address] Phone: [phone]   Fax:  [phone]  Name: TREMOND PRADA MRN: [id number] Date of Birth: 1951/08/29

## 2020-02-10 NOTE — Telephone Encounter (Signed)
  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  Mr. Venkatesan will benefit from use of Lt AFO in order to improve safety with functional mobility and ambulation.   If you agree, please submit request in EPIC under MD Order, Other Orders (list left AFO in comments) or fax to [facility] at [phone].   Thank you, Guido Sander, [facility] [address] [place], [place]  [address] Phone:  [phone] Fax:  [phone]

## 2020-02-10 NOTE — Patient Instructions (Signed)
Access Code: [email or web address] URL: [email or web address] Date: 02/10/2020 Prepared by: Ethelene Browns  Exercises Bridge with Hip Abduction and Resistance - 1 x daily - 7 x weekly - 1 sets - 10 reps Marching Bridge - 1 x daily - 7 x weekly - 1 sets - 10 reps Straight Leg Raise - 1 x daily - 7 x weekly - 1 sets - 10 reps HIP EXTENSION CONTROL EXERCISE - 1 x daily - 7 x weekly - 1 sets - 10 reps Sidelying Hip Abduction - 1 x daily - 7 x weekly - 1 sets - 10 reps Prone Knee Flexion Extension AROM - 1 x daily - 7 x weekly - 3 sets - 10 reps Prone Hip Extension with Bent Knee - 1 x daily - 7 x weekly - 1 sets - 10 reps

## 2020-02-13 ENCOUNTER — Ambulatory Visit: Payer: Medicare Other

## 2020-02-13 ENCOUNTER — Other Ambulatory Visit: Payer: Self-pay

## 2020-02-13 DIAGNOSIS — R2689 Other abnormalities of gait and mobility: Secondary | ICD-10-CM

## 2020-02-13 DIAGNOSIS — M6281 Muscle weakness (generalized): Secondary | ICD-10-CM | POA: Diagnosis not present

## 2020-02-13 DIAGNOSIS — M21372 Foot drop, left foot: Secondary | ICD-10-CM | POA: Diagnosis not present

## 2020-02-13 DIAGNOSIS — R262 Difficulty in walking, not elsewhere classified: Secondary | ICD-10-CM | POA: Diagnosis not present

## 2020-02-13 DIAGNOSIS — R2681 Unsteadiness on feet: Secondary | ICD-10-CM | POA: Diagnosis not present

## 2020-02-13 DIAGNOSIS — R29818 Other symptoms and signs involving the nervous system: Secondary | ICD-10-CM | POA: Diagnosis not present

## 2020-02-13 DIAGNOSIS — R293 Abnormal posture: Secondary | ICD-10-CM | POA: Diagnosis not present

## 2020-02-13 NOTE — Therapy (Signed)
[facility] [address] [place], [place], [address] Phone: [phone]   Fax:  [phone]  Physical Therapy Treatment  Patient Details  Name: Ryan Reed MRN: [id number] Date of Birth: September 07, 1951 Referring Provider (PT): [name], MD   Encounter Date: 02/13/2020  PT End of Session - 02/13/20 2056    Visit Number  7    Number of Visits  25    Date for PT Re-Evaluation  04/20/20    Authorization Type  [employer] Medicare    PT Start Time  1147    PT Stop Time  1230    PT Time Calculation (min)  43 min    Equipment Utilized During Treatment  Gait belt;Other (comment)   Left Blue rocker AFO & shoe cover   Activity Tolerance  Patient tolerated treatment well    Behavior During Therapy  [facility] for tasks assessed/performed       History reviewed. No pertinent past medical history.  Past Surgical History:  Procedure Laterality Date  . LACERATION REPAIR Left    left thigh  . THORACIC LAMINECTOMY FOR EPIDURAL ABSCESS N/A 10/14/2019   Procedure: Cervical seven - THORACIC seven LAMINECTOMIES FOR EPIDURAL ABSCESS;  Surgeon: [name], MD;  Location: [facility];  Service: Neurosurgery;  Laterality: N/A;    There were no vitals filed for this visit.  Subjective Assessment - 02/13/20 1146    Subjective  Pt reports that he is really challenged by the one on his stomach as does not have a good firm surfaces as well. Feeling good today. Has been standing and walking and standing with wife following with w/c and holding gait belt.    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  Yes    Pain Score  --   not rated   Pain Location  Buttocks    Pain Descriptors / Indicators  Numbness    Pain Type  Neuropathic pain    Pain Onset  More than a month ago                        [facility] Adult PT Treatment/Exercise - 02/13/20 1149      Transfers   Transfers  Sit to Stand;Stand to Sit;Squat Pivot Transfers    Sit to Stand  4: Min guard;4: Min assist    Sit to Stand Details  Verbal cues for technique    Stand to Sit  4: Min guard;4: Min Nurse, children's Transfers  4: Min guard   w/c to/from mat     Ambulation/Gait   Ambulation/Gait  Yes    Ambulation/Gait Assistance  4: Min guard;4: Min assist    Ambulation/Gait Assistance Details  Pt cued to stay up in walker. Pt using increased UE support and trunk to help with LLE advancement.     Ambulation Distance (Feet)  345 Feet    Assistive device  Rolling walker   left blue rocker anterior AFO with shoe cover donned   Gait Pattern  Step-through pattern;Narrow base of support;Decreased hip/knee flexion - left;Poor foot clearance - left    Ambulation Surface  Level;Indoor      Neuro Re-ed    Neuro Re-ed Details   Standing at walker: trying to only use light UE  support tapping 2 dots in front of left foot with tactile assist to prevent trunk lean to help and also to facilitate at pelvis with verbal cues to try to bring knee to chest. Performed x 10. Pt was challenged when PT prevented trunk from helping more and when bringing back at times would drag dot. Performed tapping stone in front with left foot with min assist of PT x 10. Standing without UE support x 30 sec eyes open and then 30 sec eyes closed. CGA/min assist at times with eyes closed as leaning forward some. Playing catch with 1kg med ball x 1 min then 2kg med ball x 1 min with rehab tech catching/tossing and PT guarding. Verbal cues to tighten tummy to help support.             PT Education - 02/13/20 2055    Education Details  Pt to continue with current HEP    Person(s) Educated  Patient    Methods  Explanation    Comprehension  Verbalized understanding       PT Short Term Goals - 02/10/20 2047      PT SHORT  TERM GOAL #1   Title  Pt and pt's wife will be independent with initial HEP for LE stretching and strengthening. ALL STGS DUE 02/18/20    Time  4    Period  Weeks    Status  New    Target Date  02/18/20      PT SHORT TERM GOAL #2   Title  Pt will perform sit <> stand from a mat table with RW and min guard - with single UE on RW and other UE on mat table for incr safety.    Time  4    Period  Weeks    Status  New      PT SHORT TERM GOAL #3   Title  Pt will tolerate standing at countertop for at least 5 minutes with min guard in order to improve tolerance for ADLs.    Time  4    Period  Weeks    Status  New      PT SHORT TERM GOAL #4   Title  Pt will ambulate at least 115' with RW and min A in order to improve functional mobility.    Time  4    Period  Weeks    Status  New      PT SHORT TERM GOAL #5   Title  Pt will undergo further assessment of L foot up brace vs. L AFO in order to improve foot clearance during gait.    Time  4    Period  Weeks    Status  New      PT SHORT TERM GOAL #6   Title  Pt will perform stand pivot transfer from w/c <> mat table with RW and min A.    Time  4    Period  Weeks    Status  New        PT Long Term Goals - 02/10/20 2048      PT LONG TERM GOAL #1   Title  Pt and pt's wife will be independent with final HEP for LE stretching and strengthening. ALL LTGS DUE 04/14/20    Time  12    Period  Weeks    Status  New      PT LONG TERM GOAL #2   Title  Pt will ambulate at least 230' with  RW and supervision in order to improve functional mobility.    Time  12    Period  Weeks    Status  New      PT LONG TERM GOAL #3   Title  Pt will undergo further assessment of 5x sit <> stand from mat table when appropriate to determine functional LE strength, LTG to be written.    Time  12    Period  Weeks    Status  New      PT LONG TERM GOAL #4   Title  Pt will undergo further assessment of gait speed with RW when appropriate.    Time  12     Period  Weeks    Status  New      PT LONG TERM GOAL #5   Title  Pt will perform all transfers from manual w/c with mod I in order to improve independence.    Time  12    Period  Weeks    Status  New            Plan - 02/13/20 2057    Clinical Impression Statement  Pt was able to further increase gait distance today. PT focused on trying to initiate more lef thip and knee flexion with standing activities today. Left leg remains weaker than right.    Personal Factors and Comorbidities  Past/Current Experience    Examination-Activity Limitations  Bathing;Stand;Squat;Locomotion Level;Bend;Hygiene/Grooming    Examination-Participation Restrictions  Cleaning;Community Activity;Yard Work    Rehab Potential  Good    PT Frequency  2x / week    PT Duration  12 weeks    PT Treatment/Interventions  ADLs/Self Care Home Management;Aquatic Therapy;Electrical Stimulation;DME Instruction;Gait training;Stair training;Functional mobility training;Neuromuscular re-education;Balance training;Therapeutic exercise;Therapeutic activities;Patient/family education;Orthotic Fit/Training;Passive range of motion    PT Next Visit Plan  Check STGs next visit. Continue with tall kneeling for strengthening, strengthening progression.  Gait training with left Blue Rocker AFO and shoe cover. Possibly try BWS over treadmill?    [place] [id number] Code: [id number]: [email or web address]: 03/22/2021Prepared by: Vaughan Basta DildayExercisesBridge with Hip Abduction and Resistance - 1 x daily - 7 x weekly - 1 sets - 10 repsMarching Bridge - 1 x daily - 7 x weekly - 1 sets - 10 repsStraight Leg Raise - 1 x daily - 7 x weekly - 1 sets - 10 repsHIP EXTENSION CONTROL EXERCISE - 1 x daily - 7 x weekly - 1 sets - 10 repsSidelying Hip Abduction - 1 x daily - 7 x weekly - 1 sets - 10 repsProne Knee Flexion Extension AROM - 1 x daily - 7 x weekly - 3 sets - 10 repsProne Hip Extension with Bent  Knee - 1 x daily - 7 x weekly - 1 sets - 10 reps    Consulted and Agree with Plan of Care  Patient;Family member/caregiver    Family Member Consulted  pt's [name]       Patient will benefit from skilled therapeutic intervention in order to improve the following deficits and impairments:  Abnormal gait, Decreased activity tolerance, Decreased balance, Decreased endurance, Decreased coordination, Decreased mobility, Decreased range of motion, Difficulty walking, Decreased strength, Impaired flexibility, Impaired sensation, Postural dysfunction, Pain  Visit Diagnosis: Other abnormalities of gait and mobility  Muscle weakness (generalized)     Problem List Patient Active Problem List   Diagnosis Date Noted  . Pressure injury of skin 11/11/2019  . Anemia   . Constipation   . Neurogenic  bladder   . Neurogenic bowel   . Quadriplegia ([place]) 10/19/2019  . 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 Epidural abscess 10/14/2019    Electa Sniff, PT, DPT, NCS 02/13/2020, 9:00 PM  [facility] [address] [place], [place], [address] Phone: [phone]   Fax:  [phone]  Name: Ryan Reed MRN: [id number] Date of Birth: 04/16/1951

## 2020-02-14 ENCOUNTER — Ambulatory Visit
Admission: RE | Admit: 2020-02-14 | Discharge: 2020-02-14 | Disposition: A | Discharge status: Home / Self Care | Payer: Medicare Other | Source: Ambulatory Visit | Attending: Neurological Surgery | Admitting: Neurological Surgery

## 2020-02-14 DIAGNOSIS — G062 Extradural and subdural abscess, unspecified: Secondary | ICD-10-CM | POA: Diagnosis not present

## 2020-02-14 MED ORDER — GADOBENATE DIMEGLUMINE 529 MG/ML IV SOLN
19.0000 mL | Freq: Once | INTRAVENOUS | Status: AC | PRN
  Administered 2020-02-14: 19 mL via INTRAVENOUS

## 2020-02-17 ENCOUNTER — Ambulatory Visit: Payer: Medicare Other

## 2020-02-17 ENCOUNTER — Other Ambulatory Visit: Payer: Self-pay

## 2020-02-17 DIAGNOSIS — M6281 Muscle weakness (generalized): Secondary | ICD-10-CM

## 2020-02-17 DIAGNOSIS — R262 Difficulty in walking, not elsewhere classified: Secondary | ICD-10-CM | POA: Diagnosis not present

## 2020-02-17 DIAGNOSIS — M21372 Foot drop, left foot: Secondary | ICD-10-CM | POA: Diagnosis not present

## 2020-02-17 DIAGNOSIS — R2681 Unsteadiness on feet: Secondary | ICD-10-CM | POA: Diagnosis not present

## 2020-02-17 DIAGNOSIS — R29818 Other symptoms and signs involving the nervous system: Secondary | ICD-10-CM | POA: Diagnosis not present

## 2020-02-17 DIAGNOSIS — R2689 Other abnormalities of gait and mobility: Secondary | ICD-10-CM | POA: Diagnosis not present

## 2020-02-17 DIAGNOSIS — R293 Abnormal posture: Secondary | ICD-10-CM | POA: Diagnosis not present

## 2020-02-17 NOTE — Therapy (Signed)
[facility] [address] [place], [place], [address] Phone: [phone]   Fax:  [phone]  Physical Therapy Treatment  Patient Details  Name: Ryan Reed MRN: [id number] Date of Birth: [birth year] Referring Provider (PT): [name], MD   Encounter Date: 02/17/2020  PT End of Session - 02/17/20 1018    Visit Number  8    Number of Visits  25    Date for PT Re-Evaluation  04/20/20    Authorization Type  [employer] Medicare    PT Start Time  1015    PT Stop Time  1100    PT Time Calculation (min)  45 min    Equipment Utilized During Treatment  Gait belt;Other (comment)   Left Blue rocker AFO & shoe cover   Activity Tolerance  Patient tolerated treatment well    Behavior During Therapy  [facility] for tasks assessed/performed       History reviewed. No pertinent past medical history.  Past Surgical History:  Procedure Laterality Date  . LACERATION REPAIR Left    left thigh  . THORACIC LAMINECTOMY FOR EPIDURAL ABSCESS N/A 10/14/2019   Procedure: Cervical seven - THORACIC seven LAMINECTOMIES FOR EPIDURAL ABSCESS;  Surgeon: [name], MD;  Location: [facility];  Service: Neurosurgery;  Laterality: N/A;    There were no vitals filed for this visit.  Subjective Assessment - 02/17/20 1017    Subjective  Pt reports that he did some walking over the weekend which went well.    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  Yes    Pain Score  --   just the usual   Pain Onset  More than a month ago                       [facility] Adult PT Treatment/Exercise - 02/17/20 1045      Transfers   Transfers  Sit to Stand;Stand to [employer]    Sit to Stand  4: Min guard    Sit to Stand Details  Verbal cues for technique    Stand to Sit  4: Min guard    Stand Pivot  Transfers  4: Min guard    Comments  Sit to stand 5 x 2 from elevated mat with pushing from mat and getting balance without touching walker then trying to control descent with legs. Pt unsteady at times and has some difficulty with eccentric control with sitting at end.      Ambulation/Gait   Ambulation/Gait  Yes    Ambulation/Gait Assistance  4: Min guard    Ambulation/Gait Assistance Details  Pt was cued to stay up in walker. To focus on increasing left foot clearance.    Ambulation Distance (Feet)  460 Feet    Assistive device  Rolling walker   left blue rockerbottom anterior AFO   Gait Pattern  Step-through pattern;Decreased hip/knee flexion - left;Decreased stance time - left;Poor foot clearance - left    Ambulation Surface  Level;Indoor    Gait velocity  25.32 sec=0.88f/sec or 0.26m      Neuro Re-ed    Neuro Re-ed Details   Pt stood at walker with minimal to no UE support at times supervision >5 min. Standing at walker without UE support playing catch  with 3.3# medicine ball x 2 min then with D1 diagonals x 10 with medicine ball to each side. CGA/min assist for safety. Verbal cues to tighten tummy for more core stabilization.             PT Education - 02/17/20 1840    Education Details  Pt to continue with current HEP    Person(s) Educated  Patient    Methods  Explanation    Comprehension  Verbalized understanding       PT Short Term Goals - 02/17/20 1019      PT SHORT TERM GOAL #1   Title  Pt and pt's wife will be independent with initial HEP for LE stretching and strengthening. ALL STGS DUE 02/18/20    Baseline  Pt reports doing exercises every morning before getting up. Doing standing and walking during the day.    Time  4    Period  Weeks    Status  Achieved    Target Date  02/18/20      PT SHORT TERM GOAL #2   Title  Pt will perform sit <> stand from a mat table with RW and min guard - with single UE on RW and other UE on mat table for incr safety.     Baseline  CGA with sit to stand from mat at walker    Time  4    Period  Weeks    Status  Achieved      PT SHORT TERM GOAL #3   Title  Pt will tolerate standing at countertop for at least 5 minutes with min guard in order to improve tolerance for ADLs.    Baseline  >5 min standing at walker with min to no UE support    Time  4    Period  Weeks    Status  Achieved      PT SHORT TERM GOAL #4   Title  Pt will ambulate at least 115' with RW and min A in order to improve functional mobility.    Baseline  460' with RW CGA    Time  4    Period  Weeks    Status  Achieved      PT SHORT TERM GOAL #5   Title  Pt will undergo further assessment of L foot up brace vs. L AFO in order to improve foot clearance during gait.    Baseline  Awaiting order from MD for left AFO.    Time  4    Period  Weeks    Status  On-going      PT SHORT TERM GOAL #6   Title  Pt will perform stand pivot transfer from w/c <> mat table with RW and min A.    Time  4    Period  Weeks    Status  Achieved        PT Long Term Goals - 02/17/20 1842      PT LONG TERM GOAL #1   Title  Pt and pt's wife will be independent with final HEP for LE stretching and strengthening. ALL LTGS DUE 04/14/20    Time  12    Period  Weeks    Status  New      PT LONG TERM GOAL #2   Title  Pt will ambulate at least 230' with RW and supervision in order to improve functional mobility.    Time  12    Period  Weeks  Status  New      PT LONG TERM GOAL #3   Title  Pt will undergo further assessment of 5x sit <> stand from mat table when appropriate to determine functional LE strength, LTG to be written.    Time  12    Period  Weeks    Status  New      PT LONG TERM GOAL #4   Title  Pt will increase gait speed from 0.8ms to >0.477m for improved household mobility.    Baseline  0.2442mon 02/17/20    Time  12    Period  Weeks    Status  Revised      PT LONG TERM GOAL #5   Title  Pt will perform all transfers from manual w/c  with mod I in order to improve independence.    Time  12    Period  Weeks    Status  New            Plan - 02/17/20 1844    Clinical Impression Statement  Pt has met 5/6 STG with still waiting on order from MD to pursue AFO. Pt will benefit from left AFO at this time and continue to use one in clinic due to ankle weakness. Pt requiring decreased assistance with transfers and has increased gait distance signicantly with less assist. Pt showing improving left foot clearance not requiring shoe cover today with less catching for foot. Pt's gait speed was assessed today and with gait speed of 0.42m52mt has decreased speed for safe community ambulator. Pt continues to benefit from skilled PT to continue towards LTGs to improve strength, balance and functional mobility.    Personal Factors and Comorbidities  Past/Current Experience    Examination-Activity Limitations  Bathing;Stand;Squat;Locomotion Level;Bend;Hygiene/Grooming    Examination-Participation Restrictions  Cleaning;Community Activity;Yard Work    Rehab Potential  Good    PT Frequency  2x / week    PT Duration  12 weeks    PT Treatment/Interventions  ADLs/Self Care Home Management;Aquatic Therapy;Electrical Stimulation;DME Instruction;Gait training;Stair training;Functional mobility training;Neuromuscular re-education;Balance training;Therapeutic exercise;Therapeutic activities;Patient/family education;Orthotic Fit/Training;Passive range of motion    PT Next Visit Plan  Check 5 x sit to stand and write LTG.  Continue with tall kneeling for strengthening, strengthening progression.  Gait training with left Blue Rocker AFO and shoe cover. Possibly try BWS over treadmill?    PT [email or web address]: [email or web address]: 03/22/2021Prepared by: LindVaughan BastadayExercisesBridge with Hip Abduction and Resistance - 1 x daily - 7 x weekly - 1 sets - 10 repsMarching Bridge - 1 x daily - 7 x  weekly - 1 sets - 10 repsStraight Leg Raise - 1 x daily - 7 x weekly - 1 sets - 10 repsHIP EXTENSION CONTROL EXERCISE - 1 x daily - 7 x weekly - 1 sets - 10 repsSidelying Hip Abduction - 1 x daily - 7 x weekly - 1 sets - 10 repsProne Knee Flexion Extension AROM - 1 x daily - 7 x weekly - 3 sets - 10 repsProne Hip Extension with Bent Knee - 1 x daily - 7 x weekly - 1 sets - 10 reps    Consulted and Agree with Plan of Care  Patient;Family member/caregiver    Family Member Consulted  pt's [name]       Patient will benefit from skilled therapeutic intervention in order to improve the following deficits and impairments:  Abnormal gait, Decreased activity tolerance, Decreased balance, Decreased endurance, Decreased  coordination, Decreased mobility, Decreased range of motion, Difficulty walking, Decreased strength, Impaired flexibility, Impaired sensation, Postural dysfunction, Pain  Visit Diagnosis: Other abnormalities of gait and mobility  Muscle weakness (generalized)     Problem List Patient Active Problem List   Diagnosis Date Noted  . Pressure injury of skin 11/11/2019  . Anemia   . Constipation   . Neurogenic bladder   . Neurogenic bowel   . Quadriplegia ([place]) 10/19/2019  . 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 Epidural abscess 10/14/2019    Electa Sniff, PT, DPT, NCS 02/17/2020, 6:48 PM  [facility] [address] [place], [place], [address] Phone: [phone]   Fax:  [phone]  Name: Ryan Reed MRN: [id number] Date of Birth: 1951-07-09

## 2020-02-18 ENCOUNTER — Telehealth: Payer: Self-pay

## 2020-02-18 NOTE — Telephone Encounter (Signed)
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  Ryan Reed will benefit from use of Lt AFO in order to improve safety with functional mobility and ambulation.   If you agree, please submit request in EPIC under MD Order, Other Orders (list left AFO in comments) or fax to [facility] at [phone].   Thank you, Cherly Anderson, PT, DPT, [facility]  [address] [place], [place]  [address] Phone:  [phone] Fax:  [phone]

## 2020-02-20 ENCOUNTER — Ambulatory Visit: Payer: Medicare Other | Attending: Neurological Surgery

## 2020-02-20 ENCOUNTER — Other Ambulatory Visit: Payer: Self-pay

## 2020-02-20 VITALS — BP 128/82

## 2020-02-20 DIAGNOSIS — R2689 Other abnormalities of gait and mobility: Secondary | ICD-10-CM | POA: Insufficient documentation

## 2020-02-20 DIAGNOSIS — R293 Abnormal posture: Secondary | ICD-10-CM | POA: Diagnosis not present

## 2020-02-20 DIAGNOSIS — M21372 Foot drop, left foot: Secondary | ICD-10-CM | POA: Diagnosis not present

## 2020-02-20 DIAGNOSIS — R29818 Other symptoms and signs involving the nervous system: Secondary | ICD-10-CM | POA: Insufficient documentation

## 2020-02-20 DIAGNOSIS — R262 Difficulty in walking, not elsewhere classified: Secondary | ICD-10-CM | POA: Diagnosis not present

## 2020-02-20 DIAGNOSIS — M6281 Muscle weakness (generalized): Secondary | ICD-10-CM | POA: Insufficient documentation

## 2020-02-20 DIAGNOSIS — R2681 Unsteadiness on feet: Secondary | ICD-10-CM | POA: Insufficient documentation

## 2020-02-20 NOTE — Therapy (Signed)
[facility] [address] [place], [place], [address] Phone: [phone]   Fax:  [phone]  Physical Therapy Treatment  Patient Details  Name: Ryan Reed MRN: [id number] Date of Birth: Sep 24, 1951 Referring Provider (PT): [name], MD   Encounter Date: 02/20/2020  PT End of Session - 02/20/20 1105    Visit Number  9    Number of Visits  25    Date for PT Re-Evaluation  04/20/20    Authorization Type  [employer] Medicare    PT Start Time  1103    PT Stop Time  1145    PT Time Calculation (min)  42 min    Equipment Utilized During Treatment  Gait belt;Other (comment)   Left Blue rocker AFO & shoe cover   Activity Tolerance  Patient tolerated treatment well    Behavior During Therapy  [facility] for tasks assessed/performed       History reviewed. No pertinent past medical history.  Past Surgical History:  Procedure Laterality Date  . LACERATION REPAIR Left    left thigh  . THORACIC LAMINECTOMY FOR EPIDURAL ABSCESS N/A 10/14/2019   Procedure: Cervical seven - THORACIC seven LAMINECTOMIES FOR EPIDURAL ABSCESS;  Surgeon: [name], MD;  Location: [facility];  Service: Neurosurgery;  Laterality: N/A;    Vitals:   02/20/20 1104  BP: 128/82    Subjective Assessment - 02/20/20 1104    Subjective  Pt reports that he is doing well. Continues to walk some at home. Has been standing a lot and trying to do some of the exercises.    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  Yes    Pain Score  3     Pain Location  Foot    Pain Orientation  Left;Right    Pain Descriptors / Indicators  Numbness    Pain Type  Neuropathic pain    Pain Onset  More than a month ago                       [facility] Adult PT Treatment/Exercise - 02/20/20 1141      Transfers   Transfers  Sit to  Stand;Stand to Sit    Sit to Stand  4: Min guard    Sit to Stand Details  Verbal cues for technique    Stand to Sit  4: Min guard      Ambulation/Gait   Gait Comments  BWS over treadmill at 0.54mph with 50-60# unweighted. 2 min and 3 min 20 sec without AFO  then 5 min with left AFO. BP=138/82 after gait. Pt need min assist +2 for safety to step up on treadmill with walker and min assist +2 to sit back in w/c from treadmill when getting off. PT assisted at left leg with max assist for foot clearance without AFO and then with mod assist at left knee to try to get more hip/knee flexion with swing with AFO donned. Pt was also given verbal cues to increase right step length which did improve after cuing. Pt was also cued to not hip hike on left to try to help with advancement. Pt's legs tired after. Rehab tech helped with treadmill operation and keeping eye on right leg just in case.  PT Education - 02/20/20 1343    Education Details  Pt to continue with current HEP. Discussed still waiting on MD order for AFO    Person(s) Educated  Patient;Spouse    Methods  Explanation    Comprehension  Verbalized understanding       PT Short Term Goals - 02/17/20 1019      PT SHORT TERM GOAL #1   Title  Pt and pt's wife will be independent with initial HEP for LE stretching and strengthening. ALL STGS DUE 02/18/20    Baseline  Pt reports doing exercises every morning before getting up. Doing standing and walking during the day.    Time  4    Period  Weeks    Status  Achieved    Target Date  02/18/20      PT SHORT TERM GOAL #2   Title  Pt will perform sit <> stand from a mat table with RW and min guard - with single UE on RW and other UE on mat table for incr safety.    Baseline  CGA with sit to stand from mat at walker    Time  4    Period  Weeks    Status  Achieved      PT SHORT TERM GOAL #3   Title  Pt will tolerate standing at countertop for at least 5 minutes with min guard in  order to improve tolerance for ADLs.    Baseline  >5 min standing at walker with min to no UE support    Time  4    Period  Weeks    Status  Achieved      PT SHORT TERM GOAL #4   Title  Pt will ambulate at least 115' with RW and min A in order to improve functional mobility.    Baseline  460' with RW CGA    Time  4    Period  Weeks    Status  Achieved      PT SHORT TERM GOAL #5   Title  Pt will undergo further assessment of L foot up brace vs. L AFO in order to improve foot clearance during gait.    Baseline  Awaiting order from MD for left AFO.    Time  4    Period  Weeks    Status  On-going      PT SHORT TERM GOAL #6   Title  Pt will perform stand pivot transfer from w/c <> mat table with RW and min A.    Time  4    Period  Weeks    Status  Achieved        PT Long Term Goals - 02/17/20 1842      PT LONG TERM GOAL #1   Title  Pt and pt's wife will be independent with final HEP for LE stretching and strengthening. ALL LTGS DUE 04/14/20    Time  12    Period  Weeks    Status  New      PT LONG TERM GOAL #2   Title  Pt will ambulate at least 230' with RW and supervision in order to improve functional mobility.    Time  12    Period  Weeks    Status  New      PT LONG TERM GOAL #3   Title  Pt will undergo further assessment of 5x sit <> stand from mat table when appropriate to determine functional LE  strength, LTG to be written.    Time  12    Period  Weeks    Status  New      PT LONG TERM GOAL #4   Title  Pt will increase gait speed from 0.60m/s to >0.84m/s for improved household mobility.    Baseline  0.72m/s on 02/17/20    Time  12    Period  Weeks    Status  Revised      PT LONG TERM GOAL #5   Title  Pt will perform all transfers from manual w/c with mod I in order to improve independence.    Time  12    Period  Weeks    Status  New            Plan - 02/20/20 1343    Clinical Impression Statement  Session focused on gait training in BWS over treadmill  to try to decrease UE use and utilize legs more. Pt did well with right leg advancement but needed mod/max assist on LLE. Pt felt more tired with gait in this manner especially in legs but had less subsitution.    Personal Factors and Comorbidities  Past/Current Experience    Examination-Activity Limitations  Bathing;Stand;Squat;Locomotion Level;Bend;Hygiene/Grooming    Examination-Participation Restrictions  Cleaning;Community Activity;Yard Work    Rehab Potential  Good    PT Frequency  2x / week    PT Duration  12 weeks    PT Treatment/Interventions  ADLs/Self Care Home Management;Aquatic Therapy;Electrical Stimulation;DME Instruction;Gait training;Stair training;Functional mobility training;Neuromuscular re-education;Balance training;Therapeutic exercise;Therapeutic activities;Patient/family education;Orthotic Fit/Training;Passive range of motion    PT Next Visit Plan  10th visit progress note. Check 5 x sit to stand and write LTG. Has order come back for AFO from Dr. Ronnald Ramp. I sent second request this week. May want to try PCP.  Continue with strengthening, standing balance.  Gait training with left Blue Rocker AFO and shoe cover. BWS over treadmill.    [place] [id number] Code: [id number]: [email or web address]: 03/22/2021Prepared by: Vaughan Basta DildayExercisesBridge with Hip Abduction and Resistance - 1 x daily - 7 x weekly - 1 sets - 10 repsMarching Bridge - 1 x daily - 7 x weekly - 1 sets - 10 repsStraight Leg Raise - 1 x daily - 7 x weekly - 1 sets - 10 repsHIP EXTENSION CONTROL EXERCISE - 1 x daily - 7 x weekly - 1 sets - 10 repsSidelying Hip Abduction - 1 x daily - 7 x weekly - 1 sets - 10 repsProne Knee Flexion Extension AROM - 1 x daily - 7 x weekly - 3 sets - 10 repsProne Hip Extension with Bent Knee - 1 x daily - 7 x weekly - 1 sets - 10 reps    Consulted and Agree with Plan of Care  Patient;Family member/caregiver    Family Member Consulted  pt's  wife [name]       Patient will benefit from skilled therapeutic intervention in order to improve the following deficits and impairments:  Abnormal gait, Decreased activity tolerance, Decreased balance, Decreased endurance, Decreased coordination, Decreased mobility, Decreased range of motion, Difficulty walking, Decreased strength, Impaired flexibility, Impaired sensation, Postural dysfunction, Pain  Visit Diagnosis: Other abnormalities of gait and mobility  Muscle weakness (generalized)     Problem List Patient Active Problem List   Diagnosis Date Noted  . Pressure injury of skin 11/11/2019  . Anemia   . Constipation   . Neurogenic bladder   . Neurogenic bowel   .  Quadriplegia ([place]) 10/19/2019  . 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 Epidural abscess 10/14/2019    Electa Sniff, PT, DPT, NCS 02/20/2020, 1:47 PM  [facility] [address] [place], [place], [address] Phone: [phone]   Fax:  [phone]  Name: ZAIR CASSERLY MRN: [id number] Date of Birth: May 03, 1951

## 2020-02-25 ENCOUNTER — Telehealth: Payer: Self-pay | Admitting: Physical Therapy

## 2020-02-25 ENCOUNTER — Other Ambulatory Visit: Payer: Self-pay | Admitting: Physical Medicine and Rehabilitation

## 2020-02-25 ENCOUNTER — Ambulatory Visit: Payer: Medicare Other | Admitting: Physical Therapy

## 2020-02-25 ENCOUNTER — Other Ambulatory Visit: Payer: Self-pay

## 2020-02-25 DIAGNOSIS — R2689 Other abnormalities of gait and mobility: Secondary | ICD-10-CM | POA: Diagnosis not present

## 2020-02-25 DIAGNOSIS — G061 Intraspinal abscess and granuloma: Secondary | ICD-10-CM

## 2020-02-25 DIAGNOSIS — B9689 Other specified bacterial agents as the cause of diseases classified elsewhere: Secondary | ICD-10-CM

## 2020-02-25 DIAGNOSIS — M6281 Muscle weakness (generalized): Secondary | ICD-10-CM

## 2020-02-25 DIAGNOSIS — R293 Abnormal posture: Secondary | ICD-10-CM | POA: Diagnosis not present

## 2020-02-25 DIAGNOSIS — R29818 Other symptoms and signs involving the nervous system: Secondary | ICD-10-CM | POA: Diagnosis not present

## 2020-02-25 DIAGNOSIS — R262 Difficulty in walking, not elsewhere classified: Secondary | ICD-10-CM | POA: Diagnosis not present

## 2020-02-25 DIAGNOSIS — M21372 Foot drop, left foot: Secondary | ICD-10-CM | POA: Diagnosis not present

## 2020-02-25 DIAGNOSIS — R2681 Unsteadiness on feet: Secondary | ICD-10-CM | POA: Diagnosis not present

## 2020-02-25 NOTE — Telephone Encounter (Signed)
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  If you agree, please submit request in EPIC under MD Order, Other Orders (listleftAFO in comments) or fax to [facility] at [phone].   Thank you, Janann August, PT, DPT 02/25/20 1:49 PM      [facility]  [address] [place], [place] [address] Phone: [phone] Fax: [phone]

## 2020-02-26 ENCOUNTER — Encounter: Payer: Self-pay | Admitting: Physical Therapy

## 2020-02-26 DIAGNOSIS — R339 Retention of urine, unspecified: Secondary | ICD-10-CM | POA: Diagnosis not present

## 2020-02-26 DIAGNOSIS — G8250 Quadriplegia, unspecified: Secondary | ICD-10-CM | POA: Diagnosis not present

## 2020-02-26 DIAGNOSIS — N319 Neuromuscular dysfunction of bladder, unspecified: Secondary | ICD-10-CM | POA: Diagnosis not present

## 2020-02-26 NOTE — Therapy (Signed)
[facility] [address] [place], [place], [address] Phone: [phone]   Fax:  [phone]  Physical Therapy Treatment & 10th Visit Progress Note    Reporting Period;  01-20-20 - 02-25-20 See below for progress towards goals  Patient Details  Name: Ryan Reed MRN: [id number] Date of Birth: 06-10-1951 Referring Provider (PT): [name], MD   Encounter Date: 02/25/2020  PT End of Session - 02/26/20 1922    Visit Number  10    Number of Visits  25    Date for PT Re-Evaluation  04/20/20    Authorization Type  [employer]HC Medicare    PT Start Time  1315    PT Stop Time  1400    PT Time Calculation (min)  45 min    Equipment Utilized During Treatment  Gait belt;Other (comment)   Left Blue rocker AFO & shoe cover   Activity Tolerance  Patient tolerated treatment well    Behavior During Therapy  [facility] for tasks assessed/performed       History reviewed. No pertinent past medical history.  Past Surgical History:  Procedure Laterality Date  . LACERATION REPAIR Left    left thigh  . THORACIC LAMINECTOMY FOR EPIDURAL ABSCESS N/A 10/14/2019   Procedure: Cervical seven - THORACIC seven LAMINECTOMIES FOR EPIDURAL ABSCESS;  Surgeon: [name], MD;  Location: [facility];  Service: Neurosurgery;  Laterality: N/A;    There were no vitals filed for this visit.  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  Yes    Pain Score  3     Pain Location  Foot    Pain Orientation  Right;Left    Pain Descriptors / Indicators  Numbness    Pain Type  Neuropathic pain    Pain Onset  More than a month ago    Pain Frequency   Constant                       [facility] Adult PT Treatment/Exercise - 02/26/20 0001      Transfers   Transfers  Sit to Stand;Stand to Sit    Sit to Stand  4: Min assist    Stand to Sit  4: Min guard      Ambulation/Gait   Ambulation/Gait  Yes    Ambulation/Gait Assistance  4: Min assist    Ambulation/Gait Assistance Details  Blue rocker AFO with shoe cover used on LLE    Ambulation Distance (Feet)  350 Feet    Assistive device  Rolling walker    Gait Pattern  Step-through pattern;Narrow base of support;Decreased hip/knee flexion - left;Poor foot clearance - left    Ambulation Surface  Level;Indoor      Neuro Re-ed    Neuro Re-ed Details   in quadruped position on mat - pt moved each leg forward/back 5 reps each before transferring to floor to continue with quadruped actiivities.  Pt performed mat to floor transfer with mod assist; transferred to quadruped position on mat on floor; performed crawling forward/back on red mat 5 times  with CGA to min assist; pt transferred from floor to mat with +1 mod assist (2nd person present for safety)       Knee/Hip Exercises: Aerobic   Recumbent Bike  SciFit level 2.5 x 4" with LE's only      Knee/Hip Exercises: Prone   Hamstring Curl  1 set;10 reps   no weight used   Hip Extension  AAROM;Right;Left;1 set   10 reps              PT Short Term Goals - 02/26/20 1928      PT SHORT TERM GOAL #1   Title  Pt and pt's wife will be independent with initial HEP for LE stretching and strengthening. ALL STGS DUE 02/18/20    Baseline  Pt reports doing exercises every morning before getting up. Doing standing and walking during the day.    Time  4    Period  Weeks    Status  Achieved    Target Date  02/18/20      PT SHORT TERM GOAL #2   Title  Pt will perform sit <> stand from a mat table with RW and min guard - with single UE on RW and other UE on mat table for incr safety.    Baseline  CGA with sit to stand from mat at walker     Time  4    Period  Weeks    Status  Achieved      PT SHORT TERM GOAL #3   Title  Pt will tolerate standing at countertop for at least 5 minutes with min guard in order to improve tolerance for ADLs.    Baseline  >5 min standing at walker with min to no UE support    Time  4    Period  Weeks    Status  Achieved      PT SHORT TERM GOAL #4   Title  Pt will ambulate at least 115' with RW and min A in order to improve functional mobility.    Baseline  460' with RW CGA    Time  4    Period  Weeks    Status  Achieved      PT SHORT TERM GOAL #5   Title  Pt will undergo further assessment of L foot up brace vs. L AFO in order to improve foot clearance during gait.    Baseline  Awaiting order from MD for left AFO.    Time  4    Period  Weeks    Status  On-going      PT SHORT TERM GOAL #6   Title  Pt will perform stand pivot transfer from w/c <> mat table with RW and min A.    Time  4    Period  Weeks    Status  Achieved        PT Long Term Goals - 02/26/20 1928      PT LONG TERM GOAL #1   Title  Pt and pt's wife will be independent with final HEP for LE stretching and strengthening. ALL LTGS DUE 04/14/20    Time  12    Period  Weeks    Status  New      PT LONG TERM GOAL #2   Title  Pt will ambulate at least 230' with RW and supervision in order to improve functional mobility.    Time  12    Period  Weeks  Status  New      PT LONG TERM GOAL #3   Title  Pt will undergo further assessment of 5x sit <> stand from mat table when appropriate to determine functional LE strength, LTG to be written.    Time  12    Period  Weeks    Status  New      PT LONG TERM GOAL #4   Title  Pt will increase gait speed from 0.48ms to >0.415m for improved household mobility.    Baseline  0.2481mon 02/17/20    Time  12    Period  Weeks    Status  Revised      PT LONG TERM GOAL #5   Title  Pt will perform all transfers from manual w/c with mod I in order to improve independence.     Time  12    Period  Weeks    Status  New            Plan - 02/26/20 1933    Clinical Impression Statement  This 10th visit progress note covers dates 01-20-20 - 02-25-20.  Pt has met 5/6 STG's and is progressing well.  Floor to stand transfer performed for first time today with pt requiring mod assist for transfer from floor.  Pt is able to amb. with RW with AFO on LLE at least 350' with min to CGA.  Request for AFO has been sent to MD- will assist pt in obtaining orthosis when order has been received.    Personal Factors and Comorbidities  Past/Current Experience    Examination-Activity Limitations  Bathing;Stand;Squat;Locomotion Level;Bend;Hygiene/Grooming    Examination-Participation Restrictions  Cleaning;Community Activity;Yard Work    Rehab Potential  Good    PT Frequency  2x / week    PT Duration  12 weeks    PT Treatment/Interventions  ADLs/Self Care Home Management;Aquatic Therapy;Electrical Stimulation;DME Instruction;Gait training;Stair training;Functional mobility training;Neuromuscular re-education;Balance training;Therapeutic exercise;Therapeutic activities;Patient/family education;Orthotic Fit/Training;Passive range of motion    PT Next Visit Plan  Pt needs  8 wk STG's written -- Check 5 x sit to stand and write LTG. Continue with strengthening, standing balance.  Gait training with left Blue Rocker AFO and shoe cover. BWS over treadmill.    PT [email or web address]: [email or web address]: 03/22/2021Prepared by: LinVaughan BastaldayExercisesBridge with Hip Abduction and Resistance - 1 x daily - 7 x weekly - 1 sets - 10 repsMarching Bridge - 1 x daily - 7 x weekly - 1 sets - 10 repsStraight Leg Raise - 1 x daily - 7 x weekly - 1 sets - 10 repsHIP EXTENSION CONTROL EXERCISE - 1 x daily - 7 x weekly - 1 sets - 10 repsSidelying Hip Abduction - 1 x daily - 7 x weekly - 1 sets - 10 repsProne Knee Flexion Extension AROM - 1 x daily - 7 x  weekly - 3 sets - 10 repsProne Hip Extension with Bent Knee - 1 x daily - 7 x weekly - 1 sets - 10 reps    Consulted and Agree with Plan of Care  Patient;Family member/caregiver    Family Member Consulted  pt's wife [name]       Patient will benefit from skilled therapeutic intervention in order to improve the following deficits and impairments:  Abnormal gait, Decreased activity tolerance, Decreased balance, Decreased endurance, Decreased coordination, Decreased mobility, Decreased range of motion, Difficulty walking, Decreased strength, Impaired flexibility, Impaired sensation, Postural dysfunction, Pain  Visit Diagnosis: Other abnormalities of  gait and mobility  Muscle weakness (generalized)     Problem List Patient Active Problem List   Diagnosis Date Noted  . Pressure injury of skin 11/11/2019  . Anemia   . Constipation   . Neurogenic bladder   . Neurogenic bowel   . Quadriplegia ([place]) 10/19/2019  . 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 Epidural abscess 10/14/2019    Alda Lea, PT 02/26/2020, 7:39 PM  [facility] [address] [place], [place], [address] Phone: [phone]   Fax:  [phone]  Name: Ryan Reed MRN: [id number] Date of Birth: [birth year]

## 2020-02-27 ENCOUNTER — Other Ambulatory Visit: Payer: Self-pay

## 2020-02-27 ENCOUNTER — Ambulatory Visit: Payer: Medicare Other | Admitting: Physical Therapy

## 2020-02-27 DIAGNOSIS — R293 Abnormal posture: Secondary | ICD-10-CM | POA: Diagnosis not present

## 2020-02-27 DIAGNOSIS — R29818 Other symptoms and signs involving the nervous system: Secondary | ICD-10-CM | POA: Diagnosis not present

## 2020-02-27 DIAGNOSIS — R2689 Other abnormalities of gait and mobility: Secondary | ICD-10-CM | POA: Diagnosis not present

## 2020-02-27 DIAGNOSIS — R262 Difficulty in walking, not elsewhere classified: Secondary | ICD-10-CM | POA: Diagnosis not present

## 2020-02-27 DIAGNOSIS — R2681 Unsteadiness on feet: Secondary | ICD-10-CM | POA: Diagnosis not present

## 2020-02-27 DIAGNOSIS — M21372 Foot drop, left foot: Secondary | ICD-10-CM

## 2020-02-27 DIAGNOSIS — M6281 Muscle weakness (generalized): Secondary | ICD-10-CM

## 2020-02-27 NOTE — Therapy (Signed)
[facility] [address] [place] [address], [place], [address] Phone: [phone]   Fax:  [phone]  Physical Therapy Treatment  Patient Details  Name: Ryan Reed MRN: [id number] Date of Birth: 12-23-1950 Referring Provider (PT): [name], MD   Encounter Date: 02/27/2020  PT End of Session - 02/28/20 0828    Visit Number  11    Number of Visits  25    Date for PT Re-Evaluation  04/20/20    Authorization Type  [employer] Medicare    PT Start Time  1100    PT Stop Time  1145    PT Time Calculation (min)  45 min    Equipment Utilized During Treatment  Gait belt;Other (comment)   Left Blue rocker AFO & shoe cover   Activity Tolerance  Patient tolerated treatment well    Behavior During Therapy  WFL for tasks assessed/performed       No past medical history on file.  Past Surgical History:  Procedure Laterality Date  . LACERATION REPAIR Left    left thigh  . THORACIC LAMINECTOMY FOR EPIDURAL ABSCESS N/A 10/14/2019   Procedure: Cervical seven - THORACIC seven LAMINECTOMIES FOR EPIDURAL ABSCESS;  Surgeon: [name], MD;  Location: [facility];  Service: Neurosurgery;  Laterality: N/A;    There were no vitals filed for this visit.  Subjective Assessment - 02/27/20 1102    Subjective  Pt states he is up to 15" with standing at home; says he is walking some but has not heard anything about his brace (AFO)    Patient is accompained by:  Family member   wife [name]   Pertinent History  hard of hearing, SCI (09/2019)    Patient Stated Goals  wants to walk again,    Pain Onset  More than a month ago                       [facility] Adult PT Treatment/Exercise - 02/27/20 1110      Transfers   Transfers  Sit to Stand;Stand to Sit    Sit to Stand  4: Min assist    Five time sit to stand comments   5 sit <> stands with RW in 28.44 secs with Min Assist    Comments  Sit to stand 2 x 5 reps from elevated mat with  pushing from mat and getting balance without touching walker then trying to control descent with legs. Pt unsteady at times and has some difficulty with eccentric control with sitting at end. Performed from elevated mat table      Ambulation/Gait   Ambulation/Gait  Yes    Ambulation/Gait Assistance  4: Min assist    Ambulation/Gait Assistance Details  Gait with blue rocker AFO donned and shoe cover used on LLE.     Ambulation Distance (Feet)  345 Feet    Assistive device  Rolling walker    Gait Pattern  Step-through pattern;Narrow base of support;Decreased hip/knee flexion - left;Poor foot clearance - left    Ambulation Surface  Level;Indoor      Therapeutic Activites    Therapeutic Activities  Other Therapeutic Activities    Other Therapeutic Activities  received order for AFO - discussed with pt and printed out order for pt to take to [facility] and gave pt contact information for [facility] to call and make an appointment. Therapist stating that she would call and speak to the orthotist there to discuss what  would be best (blue rocker AFO as well as a leather toe cap for LLE to improve foot clearance), pt verbalized understanding and will call and make an appt later today.        Neuro Re-ed    Neuro Re-ed Details   Standing Balance activities standing at edge on mat in RW: feet apart with eyes closed 3 x 15 - 20 secs each. Increased postural sway noted. Standing vertical and horizontal head turns 2 x 10.  Standing alternating toe taps to 4" step 3 reps B, with fatigue and compensatory motion for L hip flexion down graded to 2" step, PT provided min A to L lower extremity for proper placement.      Exercises   Exercises  Other Exercises    Other Exercises   seated at edge of mat with pillow case under pt's LLE: 10 reps heel slides for hamstring activation with manual resistance from therapist - cues for breathing             PT Education - 02/28/20 0828    Education Details  see TA     Person(s) Educated  Patient    Methods  Explanation;Handout    Comprehension  Verbalized understanding       PT Short Term Goals - 02/26/20 1928      PT SHORT TERM GOAL #1   Title  Pt and pt's wife will be independent with initial HEP for LE stretching and strengthening. ALL STGS DUE 02/18/20    Baseline  Pt reports doing exercises every morning before getting up. Doing standing and walking during the day.    Time  4    Period  Weeks    Status  Achieved    Target Date  02/18/20      PT SHORT TERM GOAL #2   Title  Pt will perform sit <> stand from a mat table with RW and min guard - with single UE on RW and other UE on mat table for incr safety.    Baseline  CGA with sit to stand from mat at walker    Time  4    Period  Weeks    Status  Achieved      PT SHORT TERM GOAL #3   Title  Pt will tolerate standing at countertop for at least 5 minutes with min guard in order to improve tolerance for ADLs.    Baseline  >5 min standing at walker with min to no UE support    Time  4    Period  Weeks    Status  Achieved      PT SHORT TERM GOAL #4   Title  Pt will ambulate at least 115' with RW and min A in order to improve functional mobility.    Baseline  460' with RW CGA    Time  4    Period  Weeks    Status  Achieved      PT SHORT TERM GOAL #5   Title  Pt will undergo further assessment of L foot up brace vs. L AFO in order to improve foot clearance during gait.    Baseline  Awaiting order from MD for left AFO.    Time  4    Period  Weeks    Status  On-going      PT SHORT TERM GOAL #6   Title  Pt will perform stand pivot transfer from w/c <> mat table with RW and min  A.    Time  4    Period  Weeks    Status  Achieved        Revised STGs:  PT Short Term Goals - 02/28/20 0830      PT SHORT TERM GOAL #1   Title  Pt will increase gait speed from 0.56m/s to >0.65m/s for improved household mobility.  ALL STGS DUE 03/20/20    Time  4    Period  Weeks    Status  New    Target  Date  03/20/20      PT SHORT TERM GOAL #2   Title  Pt will perform sit <> stand from a mat table with RW and supervision - with single UE on RW and other UE on mat table for incr safety.    Baseline  CGA with sit to stand from mat at walker    Time  4    Period  Weeks    Status  Revised      PT SHORT TERM GOAL #3   Title  Pt will decr 5x sit <> stand time from mat table at standard height to 25 seconds or less with min guard in order to demo improved BLE strength.    Baseline  28.44 seconds with min A    Time  4    Period  Weeks    Status  New      PT SHORT TERM GOAL #4   Title  Pt will ambulate at least 300' with RW and supervision in order to improve functional mobility.    Baseline  460' with RW CGA    Time  4    Period  Weeks    Status  Revised         PT Long Term Goals - 02/26/20 1928      PT LONG TERM GOAL #1   Title  Pt and pt's wife will be independent with final HEP for LE stretching and strengthening. ALL LTGS DUE 04/14/20    Time  12    Period  Weeks    Status  New      PT LONG TERM GOAL #2   Title  Pt will ambulate at least 230' with RW and supervision in order to improve functional mobility.    Time  12    Period  Weeks    Status  New      PT LONG TERM GOAL #3   Title  Pt will undergo further assessment of 5x sit <> stand from mat table when appropriate to determine functional LE strength, LTG to be written.    Time  12    Period  Weeks    Status  New      PT LONG TERM GOAL #4   Title  Pt will increase gait speed from 0.47m/s to >0.9m/s for improved household mobility.    Baseline  0.58m/s on 02/17/20    Time  12    Period  Weeks    Status  Revised      PT LONG TERM GOAL #5   Title  Pt will perform all transfers from manual w/c with mod I in order to improve independence.    Time  12    Period  Weeks    Status  New             02/28/20 [id number]  Plan  Clinical Impression Statement Today's skilled session continued to focus on gait training,  standing balance,  and functional BLE strengthening/transfer training. Order for AFO has been received, pt provided information regarding making an appointment with [facility] with pt verbalizing understanding. Pt needing close min guard for standing balance with head motions in RW with no UE at edge of mat. Pt remains very motivated - will continue to progress towards LTGs.  Personal Factors and Comorbidities Past/Current Experience  Examination-Activity Limitations Bathing;Stand;Squat;Locomotion Level;Bend;Hygiene/Grooming  Examination-Participation Restrictions Cleaning;Community Activity;Yard Work  Pt will benefit from skilled therapeutic intervention in order to improve on the following deficits Abnormal gait;Decreased activity tolerance;Decreased balance;Decreased endurance;Decreased coordination;Decreased mobility;Decreased range of motion;Difficulty walking;Decreased strength;Impaired flexibility;Impaired sensation;Postural dysfunction;Pain  Rehab Potential Good  PT Frequency 2x / week  PT Duration 12 weeks  PT Treatment/Interventions ADLs/Self Care Home Management;Aquatic Therapy;Electrical Stimulation;DME Instruction;Gait training;Stair training;Functional mobility training;Neuromuscular re-education;Balance training;Therapeutic exercise;Therapeutic activities;Patient/family education;Orthotic Fit/Training;Passive range of motion  PT Next Visit Plan AFO appointment? Continue with strengthening, standing balance.  Gait training with left Blue Rocker AFO and shoe cover. BWS over treadmill.  [place] [id number] Code: [id number]: [email or web address]: 03/22/2021Prepared by: Vaughan Basta DildayExercisesBridge with Hip Abduction and Resistance - 1 x daily - 7 x weekly - 1 sets - 10 repsMarching Bridge - 1 x daily - 7 x weekly - 1 sets - 10 repsStraight Leg Raise - 1 x daily - 7 x weekly - 1 sets - 10 repsHIP EXTENSION CONTROL EXERCISE - 1 x daily - 7 x weekly - 1  sets - 10 repsSidelying Hip Abduction - 1 x daily - 7 x weekly - 1 sets - 10 repsProne Knee Flexion Extension AROM - 1 x daily - 7 x weekly - 3 sets - 10 repsProne Hip Extension with Bent Knee - 1 x daily - 7 x weekly - 1 sets - 10 reps  Consulted and Agree with Plan of Care Patient;Family member/caregiver  Family Member Consulted pt's wife [name]      Patient will benefit from skilled therapeutic intervention in order to improve the following deficits and impairments:     Visit Diagnosis: Other abnormalities of gait and mobility  Muscle weakness (generalized)  Other symptoms and signs involving the nervous system  Unsteadiness on feet  Abnormal posture  Foot drop, left     Problem List Patient Active Problem List   Diagnosis Date Noted  . Pressure injury of skin 11/11/2019  . Anemia   . Constipation   . Neurogenic bladder   . Neurogenic bowel   . Quadriplegia ([place]) 10/19/2019  . 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 Epidural abscess 10/14/2019    Arliss Journey, PT, DPT  02/28/2020, 8:30 AM  [facility] [address] [place], [place], [address] Phone: [phone]   Fax:  [phone]  Name: Ryan Reed MRN: [id number] Date of Birth: [birth year]

## 2020-03-02 DIAGNOSIS — G8250 Quadriplegia, unspecified: Secondary | ICD-10-CM | POA: Diagnosis not present

## 2020-03-03 ENCOUNTER — Ambulatory Visit: Payer: Medicare Other

## 2020-03-05 ENCOUNTER — Other Ambulatory Visit: Payer: Self-pay

## 2020-03-05 ENCOUNTER — Ambulatory Visit: Payer: Medicare Other | Admitting: Physical Therapy

## 2020-03-05 ENCOUNTER — Encounter: Payer: Self-pay | Admitting: Physical Therapy

## 2020-03-05 DIAGNOSIS — R2689 Other abnormalities of gait and mobility: Secondary | ICD-10-CM

## 2020-03-05 DIAGNOSIS — R29818 Other symptoms and signs involving the nervous system: Secondary | ICD-10-CM

## 2020-03-05 DIAGNOSIS — M6281 Muscle weakness (generalized): Secondary | ICD-10-CM

## 2020-03-05 DIAGNOSIS — R293 Abnormal posture: Secondary | ICD-10-CM | POA: Diagnosis not present

## 2020-03-05 DIAGNOSIS — R262 Difficulty in walking, not elsewhere classified: Secondary | ICD-10-CM | POA: Diagnosis not present

## 2020-03-05 DIAGNOSIS — M21372 Foot drop, left foot: Secondary | ICD-10-CM | POA: Diagnosis not present

## 2020-03-05 DIAGNOSIS — R2681 Unsteadiness on feet: Secondary | ICD-10-CM | POA: Diagnosis not present

## 2020-03-06 ENCOUNTER — Encounter: Payer: Self-pay | Admitting: Physical Therapy

## 2020-03-06 ENCOUNTER — Encounter: Payer: Self-pay | Admitting: Physical Medicine and Rehabilitation

## 2020-03-06 ENCOUNTER — Encounter
Payer: Medicare Other | Attending: Physical Medicine and Rehabilitation | Admitting: Physical Medicine and Rehabilitation

## 2020-03-06 ENCOUNTER — Other Ambulatory Visit: Payer: Self-pay

## 2020-03-06 VITALS — BP 128/72 | HR 54 | Temp 97.5°F | Ht 73.0 in | Wt 183.6 lb

## 2020-03-06 DIAGNOSIS — N319 Neuromuscular dysfunction of bladder, unspecified: Secondary | ICD-10-CM | POA: Insufficient documentation

## 2020-03-06 DIAGNOSIS — G061 Intraspinal abscess and granuloma: Secondary | ICD-10-CM | POA: Insufficient documentation

## 2020-03-06 DIAGNOSIS — G8250 Quadriplegia, unspecified: Secondary | ICD-10-CM | POA: Diagnosis not present

## 2020-03-06 DIAGNOSIS — B9689 Other specified bacterial agents as the cause of diseases classified elsewhere: Secondary | ICD-10-CM | POA: Diagnosis not present

## 2020-03-06 DIAGNOSIS — D649 Anemia, unspecified: Secondary | ICD-10-CM | POA: Diagnosis not present

## 2020-03-06 DIAGNOSIS — L89309 Pressure ulcer of unspecified buttock, unspecified stage: Secondary | ICD-10-CM | POA: Diagnosis not present

## 2020-03-06 DIAGNOSIS — K5900 Constipation, unspecified: Secondary | ICD-10-CM | POA: Insufficient documentation

## 2020-03-06 DIAGNOSIS — K592 Neurogenic bowel, not elsewhere classified: Secondary | ICD-10-CM | POA: Insufficient documentation

## 2020-03-06 NOTE — Therapy (Signed)
[facility] [address] [place], [place], [address] Phone: [phone]   Fax:  [phone]  Physical Therapy Treatment  Patient Details  Name: Ryan Reed MRN: [id number] Date of Birth: Oct 12, 1951 Referring Provider (PT): [name], MD   Encounter Date: 03/05/2020  PT End of Session - 03/06/20 1256    Visit Number  12    Number of Visits  25    Date for PT Re-Evaluation  04/20/20    Authorization Type  [employer]HC Medicare    PT Start Time  1147    PT Stop Time  1233    PT Time Calculation (min)  46 min    Equipment Utilized During Treatment  Gait belt;Other (comment)   Left Blue rocker AFO & shoe cover   Activity Tolerance  Patient tolerated treatment well    Behavior During Therapy  [facility] for tasks assessed/performed       History reviewed. No pertinent past medical history.  Past Surgical History:  Procedure Laterality Date  . LACERATION REPAIR Left    left thigh  . THORACIC LAMINECTOMY FOR EPIDURAL ABSCESS N/A 10/14/2019   Procedure: Cervical seven - THORACIC seven LAMINECTOMIES FOR EPIDURAL ABSCESS;  Surgeon: [name], MD;  Location: [facility];  Service: Neurosurgery;  Laterality: N/A;    There were no vitals filed for this visit.  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 ease with swing through    Ambulation Distance (Feet)  230 Feet    Assistive device  Rolling walker    Gait Pattern  Step-through pattern;Narrow base of support;Decreased hip/knee flexion - left;Poor foot clearance - left    Ambulation Surface  Level;Indoor      Knee/Hip Exercises: Stretches   Active Hamstring Stretch  Left;1 rep;30 seconds   runner's stretch   Gastroc Stretch  Left;1 rep;30 seconds   3" step used - in standing     Knee/Hip Exercises: Aerobic   Recumbent Bike  SciFit level 1.5 x 3.5" with LE's only      Knee/Hip Exercises: Standing   Forward Step Up  Left;1 set;5 reps;Hand Hold: 2;Step Height: 6";Other (comment)   mod assist with fatigue     Knee/Hip Exercises: Seated   Hamstring Curl  AAROM;Left;1 set;10 reps   Lt foot in pillow case for decr. friction     Knee/Hip Exercises: Sidelying   Hip ABduction  AAROM;Left;10 reps    Clams  10 reps LLE  Knee/Hip Exercises: Prone   Hamstring Curl  3 sets;10 reps;Other (comment)   AAROM   Hip Extension  AAROM;Left;3 sets;10 reps   knee flexed at 90 degrees            PT Education - 03/06/20 1255    Education Details  heel cord and runner's stretch added to HEP    Person(s) Educated  Patient    Methods  Explanation;Demonstration;Handout    Comprehension  Verbalized understanding;Returned demonstration       PT Short Term Goals - 03/06/20 1300      PT SHORT TERM GOAL #1   Title  Pt will increase gait speed from 0.85m/s to >0.8m/s for improved household mobility.  ALL STGS DUE 03/20/20    Time  4    Period  Weeks    Status  New    Target Date  03/20/20      PT SHORT TERM GOAL #2   Title  Pt will perform sit <> stand from a mat table with RW and supervision - with single UE on RW and other UE on mat table for incr safety.    Baseline  CGA with sit to stand from mat at walker    Time  4    Period  Weeks    Status  Revised      PT  SHORT TERM GOAL #3   Title  Pt will decr 5x sit <> stand time from mat table at standard height to 25 seconds or less with min guard in order to demo improved BLE strength.    Baseline  28.44 seconds with min A    Time  4    Period  Weeks    Status  New      PT SHORT TERM GOAL #4   Title  Pt will ambulate at least 300' with RW and supervision in order to improve functional mobility.    Baseline  460' with RW CGA    Time  4    Period  Weeks    Status  Revised        PT Long Term Goals - 03/06/20 1300      PT LONG TERM GOAL #1   Title  Pt and pt's wife will be independent with final HEP for LE stretching and strengthening. ALL LTGS DUE 04/14/20    Time  12    Period  Weeks    Status  New      PT LONG TERM GOAL #2   Title  Pt will ambulate at least 230' with RW over paved surfaces and supervision in order to improve functional mobility.    Time  12    Period  Weeks    Status  Revised      PT LONG TERM GOAL #3   Title  Pt will decr 5x sit <> stand time from standard mat table to 21 seconds or less in order to demo improved functional BLE strength and improved transfer efficiency.    Time  12    Period  Weeks    Status  Revised      PT LONG TERM GOAL #4   Title  Pt will increase gait speed from 0.75m/s to >0.75m/s for improved household mobility.    Baseline  0.30m/s on 02/17/20    Time  12    Period  Weeks    Status  Revised      PT LONG TERM GOAL #5   Title  Pt  will perform all transfers from manual w/c with mod I in order to improve independence.    Time  12    Period  Weeks    Status  New            Plan - 03/06/20 1257    Clinical Impression Statement  Pt reports he has appt next Wed. at [facility] for AFO for LLE.  Pt continues to have increased extensor tone in LLE which impacts pt's ability to flex Lt hip and knee when out of synergy pattern.  Pt has decreased muscle endurance as well as signficantly decreased strength in Lt hamstrings resulting in decreased Lt  knee flexion in swing phase of gait.    Personal Factors and Comorbidities  Past/Current Experience    Examination-Activity Limitations  Bathing;Stand;Squat;Locomotion Level;Bend;Hygiene/Grooming    Examination-Participation Restrictions  Cleaning;Community Activity;Yard Work    Rehab Potential  Good    PT Frequency  2x / week    PT Duration  12 weeks    PT Treatment/Interventions  ADLs/Self Care Home Management;Aquatic Therapy;Electrical Stimulation;DME Instruction;Gait training;Stair training;Functional mobility training;Neuromuscular re-education;Balance training;Therapeutic exercise;Therapeutic activities;Patient/family education;Orthotic Fit/Training;Passive range of motion    PT Next Visit Plan  AFO appointment scheduled 03-10-20:  Continue with strengthening, standing balance.  Gait training with left Blue Rocker AFO and shoe cover. BWS over treadmill.    [place] [id number] Code: [id number]: [email or web address]: 03/22/2021Prepared by: Vaughan Basta DildayExercisesBridge with Hip Abduction and Resistance - 1 x daily - 7 x weekly - 1 sets - 10 repsMarching Bridge - 1 x daily - 7 x weekly - 1 sets - 10 repsStraight Leg Raise - 1 x daily - 7 x weekly - 1 sets - 10 repsHIP EXTENSION CONTROL EXERCISE - 1 x daily - 7 x weekly - 1 sets - 10 repsSidelying Hip Abduction - 1 x daily - 7 x weekly - 1 sets - 10 repsProne Knee Flexion Extension AROM - 1 x daily - 7 x weekly - 3 sets - 10 repsProne Hip Extension with Bent Knee - 1 x daily - 7 x weekly - 1 sets - 10 reps    Consulted and Agree with Plan of Care  Patient;Family member/caregiver    Family Member Consulted  pt's wife [name]       Patient will benefit from skilled therapeutic intervention in order to improve the following deficits and impairments:  Abnormal gait, Decreased activity tolerance, Decreased balance, Decreased endurance, Decreased coordination, Decreased mobility, Decreased range of motion,  Difficulty walking, Decreased strength, Impaired flexibility, Impaired sensation, Postural dysfunction, Pain  Visit Diagnosis: Other abnormalities of gait and mobility  Muscle weakness (generalized)  Other symptoms and signs involving the nervous system     Problem List Patient Active Problem List   Diagnosis Date Noted  . Pressure injury of skin 11/11/2019  . Anemia   . Constipation   . Neurogenic bladder   . Neurogenic bowel   . Quadriplegia ([place]) 10/19/2019  . 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 Epidural abscess 10/14/2019    Alda Lea, PT 03/06/2020, 1:01 PM  [facility] [address] [place], [place], [address] Phone: [phone]   Fax:  [phone]  Name: Ryan Reed MRN: [id number] Date of Birth: 02-Sep-1951

## 2020-03-06 NOTE — Progress Notes (Signed)
Subjective:    Patient ID: Ryan Reed, male    DOB: 1951-11-14, 69 y.o.   MRN: [id number]  HPI  Ryan Reed presents for 2nd follow-up after CIR admission for epidural abscess.  He has been doing very well at home, receiving PT and OT, and doing his exercises daily. He has been able to ambulate with a RW!  His pressure ulcers have been healing well.   He no longer has hip pain and rarely needs Tramadol. Continues to take Tizanidine HS as needed for muscle spasms.    Constipation: He is back to having a daily BM with Senna HS. He has been using digital stimulation.  He is independent in catheterization and has been cathing 4-5 times per day. Still with numbness in sacral region.   He has been taking his iron supplement every other day and plans to stop it when his current refill is complete.   He asks when he may d/c his Eliquis and when he may return to the dentist for dental work.  His wife asks regarding the results of his most recent MRI.     Pain Inventory Average Pain 3 Pain Right Now 3 My pain is intermittent, dull and aching  In the last 24 hours, has pain interfered with the following? General activity 0 Relation with others 1 Enjoyment of life 2 What TIME of day is your pain at its worst? evening Sleep (in general) Fair  Pain is worse with: sitting Pain improves with: medication Relief from Meds: 4  Mobility use a walker how many minutes can you walk? 10 ability to climb steps?  no do you drive?  no  Function I need assistance with the following:  bathing, meal prep, household duties and shopping  Neuro/Psych bladder control problems bowel control problems numbness trouble walking spasms  Prior Studies Any changes since last visit?  no  Physicians involved in your care Any changes since last visit?  no   Family History  Problem Relation Age of Onset  . Heart disease Father   . Cardiomyopathy Sister    Social History    Socioeconomic History  . Marital status: Married    Spouse name: Not on file  . Number of children: Not on file  . Years of education: Not on file  . Highest education level: Not on file  Occupational History  . Not on file  Tobacco Use  . Smoking status: Former Smoker    Types: Cigarettes    Quit date: 11/21/1988    Years since quitting: 31.3  . Smokeless tobacco: Never Used  Substance and Sexual Activity  . Alcohol use: Yes    Alcohol/week: 12.0 - 14.0 standard drinks    Types: 12 - 14 Cans of beer per week  . Drug use: Yes  . Sexual activity: Not on file  Other Topics Concern  . Not on file  Social History Narrative  . Not on file   Social Determinants of Health   Financial Resource Strain:   . Difficulty of Paying Living Expenses:   Food Insecurity:   . Worried About Charity fundraiser in the Last Year:   . Arboriculturist in the Last Year:   Transportation Needs:   . Film/video editor (Medical):   Marland Kitchen Lack of Transportation (Non-Medical):   Physical Activity:   . Days of Exercise per Week:   . Minutes of Exercise per Session:   Stress:   . Feeling of Stress :  Social Connections:   . Frequency of Communication with Friends and Family:   . Frequency of Social Gatherings with Friends and Family:   . Attends Religious Services:   . Active Member of Clubs or Organizations:   . Attends Archivist Meetings:   Marland Kitchen Marital Status:    Past Surgical History:  Procedure Laterality Date  . LACERATION REPAIR Left    left thigh  . THORACIC LAMINECTOMY FOR EPIDURAL ABSCESS N/A 10/14/2019   Procedure: Cervical seven - THORACIC seven LAMINECTOMIES FOR EPIDURAL ABSCESS;  Surgeon: [name], MD;  Location: [place];  Service: Neurosurgery;  Laterality: N/A;   No past medical history on file. Temp (!) 97.5 F (36.4 C)   Ht 6\' 1"  (1.854 m)   Wt 183 lb 9.6 oz (83.3 kg)   BMI 24.22 kg/m   Opioid Risk Score:   Fall Risk Score:  `1  Depression screen PHQ  2/9  Depression screen [facility] 2/9 03/06/2020 11/26/2019  Decreased Interest 0 0  Down, Depressed, Hopeless 0 1  PHQ - 2 Score 0 1    Review of Systems  Constitutional: Negative.   HENT: Negative.   Eyes: Negative.   Respiratory: Negative.   Cardiovascular: Negative.   Gastrointestinal: Positive for abdominal pain.       Bowel control in that occ has to do a digital stim  Endocrine: Negative.   Genitourinary:       In and out cath  Musculoskeletal: Positive for gait problem.       Spasms  Skin: Negative.   Allergic/Immunologic: Negative.   Neurological: Positive for weakness and numbness.  Hematological: Bruises/bleeds easily.       Eliquis  Psychiatric/Behavioral: Negative.   All other systems reviewed and are negative.      Objective:   Physical Exam Constitutional: No distress . Vital signs and labs and nursing notes reviewed. Laying on side, L side in bed to get off backside,NAD HENT: Normocephalic. Atraumatic. Eyes: EOMI. No discharge. Cardiovascular: No JVD. Respiratory: Normal effort. No stridor. GI: Non-distended. Skin: Warm and dry.Buttocks- Stage II- macerated/white edges- 2 small spots seen- but moist/wet appearing- otherwise looks superficial/good.Marland Kitchen Psych: Normal mood. Normal behavior. Musc: No edema in extremities. No tenderness in extremities. Neurological:  Alert Motor: Bilateral upper extremities: 5/5 proximal distal Right lower extremity: 4/5 proximal distally  Left lower extremity: Hip flexion 3/5, 3/5 knee extension 1/5, ankle dorsiflexion 0/5, wiggles toes, improving       Assessment & Plan:  C7 Quadriplegia ASIA B and functional deficitssecondary to C7-T1 epidural abscess with associated paraplegia Continue outpatient PT/OT 2. Antithrombotics: -DVT/anticoagulation-bilateral gastrocs:Eliquis BID. Can discontinue in 1 month, 6 months from initial diagnosis. Can pursue dental workup once off of Eliquis to avoid bleeding risk  while on this medication.   3. Pain Management:             Hip pain is resolved and he only uses Tramadol sparingly.  Tizanidine may be continued prn for spasms.   4. Mood:In excellent spirits.  5. Neuropsych: This patientiscapable of making decisions onhisown behalf.  Reviewed results of MRI with patient and wife, showing resolution of epidural abscess.   20 minutes spent in direct patient care. All questions answered. RTC in 6 months.

## 2020-03-06 NOTE — Patient Instructions (Signed)
Gastroc / Heel Cord Stretch - On Step - USE a 2"- 3" step or block in standing - place at counter for safety    Stand with heels over edge of stair. Holding rail, lower heels until stretch is felt in calf of legs. Repeat 1__ times. Do _3_ times per day.   Stretching: Gastroc    Stand with left foot back, leg straight, forward leg bent. Keeping heel on floor, turned slightly out, lean into wall until stretch is felt in calf. Hold _30___ seconds. Repeat __1__ times per set. Do _1-2__ sets per session. Do _3___ sessions per day.  http://orth.exer.us/662   Copyright  VHI. All rights reserved.

## 2020-03-09 DIAGNOSIS — H6123 Impacted cerumen, bilateral: Secondary | ICD-10-CM | POA: Diagnosis not present

## 2020-03-09 DIAGNOSIS — G8220 Paraplegia, unspecified: Secondary | ICD-10-CM | POA: Diagnosis not present

## 2020-03-09 DIAGNOSIS — K592 Neurogenic bowel, not elsewhere classified: Secondary | ICD-10-CM | POA: Diagnosis not present

## 2020-03-09 DIAGNOSIS — D649 Anemia, unspecified: Secondary | ICD-10-CM | POA: Diagnosis not present

## 2020-03-09 DIAGNOSIS — N319 Neuromuscular dysfunction of bladder, unspecified: Secondary | ICD-10-CM | POA: Diagnosis not present

## 2020-03-10 ENCOUNTER — Other Ambulatory Visit: Payer: Self-pay

## 2020-03-10 ENCOUNTER — Ambulatory Visit: Payer: Medicare Other | Admitting: Physical Therapy

## 2020-03-10 ENCOUNTER — Telehealth: Payer: Self-pay

## 2020-03-10 DIAGNOSIS — R29818 Other symptoms and signs involving the nervous system: Secondary | ICD-10-CM | POA: Diagnosis not present

## 2020-03-10 DIAGNOSIS — R2681 Unsteadiness on feet: Secondary | ICD-10-CM | POA: Diagnosis not present

## 2020-03-10 DIAGNOSIS — M6281 Muscle weakness (generalized): Secondary | ICD-10-CM

## 2020-03-10 DIAGNOSIS — M21372 Foot drop, left foot: Secondary | ICD-10-CM | POA: Diagnosis not present

## 2020-03-10 DIAGNOSIS — R293 Abnormal posture: Secondary | ICD-10-CM | POA: Diagnosis not present

## 2020-03-10 DIAGNOSIS — G8250 Quadriplegia, unspecified: Secondary | ICD-10-CM

## 2020-03-10 DIAGNOSIS — R262 Difficulty in walking, not elsewhere classified: Secondary | ICD-10-CM | POA: Diagnosis not present

## 2020-03-10 DIAGNOSIS — R2689 Other abnormalities of gait and mobility: Secondary | ICD-10-CM

## 2020-03-10 NOTE — Telephone Encounter (Signed)
Patient called stating need order of Aquatic Rehab to go to [facility].

## 2020-03-10 NOTE — Therapy (Signed)
[facility] [address] [place], [place], [address] Phone: [phone]   Fax:  [phone]  Physical Therapy Treatment  Patient Details  Name: Ryan Reed MRN: [id number] Date of Birth: 1950/11/29 Referring Provider (PT): [name], MD   Encounter Date: 03/10/2020  PT End of Session - 03/10/20 1432    Visit Number  13    Number of Visits  25    Date for PT Re-Evaluation  04/20/20    Authorization Type  [employer] Medicare    PT Start Time  [id number]    PT Stop Time  1232    PT Time Calculation (min)  47 min    Equipment Utilized During Treatment  Gait belt;Other (comment)   Left Blue rocker AFO & shoe cover   Activity Tolerance  Patient tolerated treatment well    Behavior During Therapy  WFL for tasks assessed/performed       No past medical history on file.  Past Surgical History:  Procedure Laterality Date  . LACERATION REPAIR Left    left thigh  . THORACIC LAMINECTOMY FOR EPIDURAL ABSCESS N/A 10/14/2019   Procedure: Cervical seven - THORACIC seven LAMINECTOMIES FOR EPIDURAL ABSCESS;  Surgeon: [name], MD;  Location: [place];  Service: Neurosurgery;  Laterality: N/A;    There were no vitals filed for this visit.  Subjective Assessment - 03/10/20 1150    Subjective  Walked over the weekend outdoors over the grass - walked 100' with wife holding on to gait belt. Will be going to [facility]. Got the order for aquatic therapy from Dr. Ranell Patrick.    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  Yes    Pain Score  --   more discomfort and numbness in buttocks area   Pain Onset  More than a month ago                       [facility] Adult PT Treatment/Exercise - 03/10/20 0001      Ambulation/Gait   Ambulation/Gait  Yes    Ambulation/Gait Assistance  4: Min  guard    Ambulation/Gait Assistance Details  blue rocker AFO with blue toe cover used for LLE for foot clearance, needing cues to watch out for uneven cracks in pavement    Ambulation Distance (Feet)  100 Feet   approx   Assistive device  Rolling walker    Gait Pattern  Step-through pattern;Narrow base of support;Decreased hip/knee flexion - left;Poor foot clearance - left    Ambulation Surface  Outdoor;Paved      Neuro Re-ed    Neuro Re-ed Details   pt transferring from prone > quadraped > up to tall kneeling with min A and use of blue kaye bench for tall kneeling position, in tall kneeling performed x10 reps mini squats with cues for glute activation at end of ROM, pt with 2 instances of LLE almost cramping, need for BUE support on bench, holding tall kneeling position without UE support and cues for glute activation x12 reps 1 lb ball toss with PT tech anteriorly, min A for balance at times       Knee/Hip Exercises: Stretches   Sports administrator  Both;3 reps;60 seconds    Quad Stretch Limitations  in prone position, performed muscle  energy technique - contract/relax B      Knee/Hip Exercises: Supine   Single Leg Bridge  Left;Strengthening;2 sets;5 reps   with RLE extended, tactile/verbal cues to lift pelvis on L   Other Supine Knee/Hip Exercises  2 x 5 reps bridging with RLE marching for incr weight shift/bearing through LLE, cues for technique and to maintain L glute activation prior to lifting RLE off mat    Other Supine Knee/Hip Exercises  resisted hip flexion (with resistance from therapist) and then hip/knee extension 2 x 7 reps, cues for breathing       Knee/Hip Exercises: Prone   Hamstring Curl  1 set;10 reps   on LLE   Hamstring Curl Limitations  assist with LLE, cues for slowed and controlled while lowering, therapist's hand on pt's pelvis to prevent compensation                 PT Short Term Goals - 03/06/20 1300      PT SHORT TERM GOAL #1   Title  Pt will increase gait  speed from 0.85m/s to >0.73m/s for improved household mobility.  ALL STGS DUE 03/20/20    Time  4    Period  Weeks    Status  New    Target Date  03/20/20      PT SHORT TERM GOAL #2   Title  Pt will perform sit <> stand from a mat table with RW and supervision - with single UE on RW and other UE on mat table for incr safety.    Baseline  CGA with sit to stand from mat at walker    Time  4    Period  Weeks    Status  Revised      PT SHORT TERM GOAL #3   Title  Pt will decr 5x sit <> stand time from mat table at standard height to 25 seconds or less with min guard in order to demo improved BLE strength.    Baseline  28.44 seconds with min A    Time  4    Period  Weeks    Status  New      PT SHORT TERM GOAL #4   Title  Pt will ambulate at least 300' with RW and supervision in order to improve functional mobility.    Baseline  460' with RW CGA    Time  4    Period  Weeks    Status  Revised        PT Long Term Goals - 03/06/20 1300      PT LONG TERM GOAL #1   Title  Pt and pt's wife will be independent with final HEP for LE stretching and strengthening. ALL LTGS DUE 04/14/20    Time  12    Period  Weeks    Status  New      PT LONG TERM GOAL #2   Title  Pt will ambulate at least 230' with RW over paved surfaces and supervision in order to improve functional mobility.    Time  12    Period  Weeks    Status  Revised      PT LONG TERM GOAL #3   Title  Pt will decr 5x sit <> stand time from standard mat table to 21 seconds or less in order to demo improved functional BLE strength and improved transfer efficiency.    Time  12    Period  Weeks    Status  Revised      PT LONG TERM GOAL #4   Title  Pt will increase gait speed from 0.27m/s to >0.34m/s for improved household mobility.    Baseline  0.23m/s on 02/17/20    Time  12    Period  Weeks    Status  Revised      PT LONG TERM GOAL #5   Title  Pt will perform all transfers from manual w/c with mod I in order to improve  independence.    Time  12    Period  Weeks    Status  New            Plan - 03/10/20 1734    Clinical Impression Statement  Pt will be going to [facility] tomorrow for appointment for AFO for LLE. Pt continues to demonstrate with gait decr hip/knee flexion during swing phase for foot clearance. Ambulated outdoors today over paved surfaces with pt needing min guard. Pt able to better tolerate tall kneeling position today with no UE support and perform a weighted ball toss for core activation and balance, needing min A at times. Will continue to progres towards LTGS.    Personal Factors and Comorbidities  Past/Current Experience    Examination-Activity Limitations  Bathing;Stand;Squat;Locomotion Level;Bend;Hygiene/Grooming    Examination-Participation Restrictions  Cleaning;Community Activity;Yard Work    Rehab Potential  Good    PT Frequency  2x / week    PT Duration  12 weeks    PT Treatment/Interventions  ADLs/Self Care Home Management;Aquatic Therapy;Electrical Stimulation;DME Instruction;Gait training;Stair training;Functional mobility training;Neuromuscular re-education;Balance training;Therapeutic exercise;Therapeutic activities;Patient/family education;Orthotic Fit/Training;Passive range of motion    PT Next Visit Plan  did he get AFO and leather toe cap? any word with aquatic therapy? Continue with strengthening, standing balance.  Gait training with left Blue Rocker AFO and shoe cover. BWS over treadmill.    [place] [id number] Code: [id number]: [email or web address]: 03/22/2021Prepared by: Vaughan Basta DildayExercisesBridge with Hip Abduction and Resistance - 1 x daily - 7 x weekly - 1 sets - 10 repsMarching Bridge - 1 x daily - 7 x weekly - 1 sets - 10 repsStraight Leg Raise - 1 x daily - 7 x weekly - 1 sets - 10 repsHIP EXTENSION CONTROL EXERCISE - 1 x daily - 7 x weekly - 1 sets - 10 repsSidelying Hip Abduction - 1 x daily - 7 x weekly - 1 sets -  10 repsProne Knee Flexion Extension AROM - 1 x daily - 7 x weekly - 3 sets - 10 repsProne Hip Extension with Bent Knee - 1 x daily - 7 x weekly - 1 sets - 10 reps    Consulted and Agree with Plan of Care  Patient;Family member/caregiver    Family Member Consulted  pt's wife [name]       Patient will benefit from skilled therapeutic intervention in order to improve the following deficits and impairments:  Abnormal gait, Decreased activity tolerance, Decreased balance, Decreased endurance, Decreased coordination, Decreased mobility, Decreased range of motion, Difficulty walking, Decreased strength, Impaired flexibility, Impaired sensation, Postural dysfunction, Pain  Visit Diagnosis: Muscle weakness (generalized)  Other abnormalities of gait and mobility  Other symptoms and signs involving the nervous system  Unsteadiness on feet  Difficulty in walking, not elsewhere classified  Foot drop, left     Problem List Patient Active Problem List   Diagnosis Date Noted  . Pressure injury of skin 11/11/2019  . Anemia   . Constipation   . Neurogenic bladder   .  Neurogenic bowel   . Quadriplegia ([place]) 10/19/2019  . 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 Epidural abscess 10/14/2019    Arliss Journey , PT, DPT  03/10/2020, 5:39 PM  [facility] [address] [place], [place], [address] Phone: [phone]   Fax:  [phone]  Name: BENECIO BLOOD MRN: [id number] Date of Birth: 02-06-1951

## 2020-03-10 NOTE — Telephone Encounter (Signed)
Sent - thank you

## 2020-03-12 ENCOUNTER — Other Ambulatory Visit: Payer: Self-pay

## 2020-03-12 ENCOUNTER — Ambulatory Visit: Payer: Medicare Other | Admitting: Physical Therapy

## 2020-03-12 DIAGNOSIS — R2689 Other abnormalities of gait and mobility: Secondary | ICD-10-CM

## 2020-03-12 DIAGNOSIS — M6281 Muscle weakness (generalized): Secondary | ICD-10-CM | POA: Diagnosis not present

## 2020-03-12 DIAGNOSIS — R2681 Unsteadiness on feet: Secondary | ICD-10-CM | POA: Diagnosis not present

## 2020-03-12 DIAGNOSIS — R262 Difficulty in walking, not elsewhere classified: Secondary | ICD-10-CM | POA: Diagnosis not present

## 2020-03-12 DIAGNOSIS — M21372 Foot drop, left foot: Secondary | ICD-10-CM | POA: Diagnosis not present

## 2020-03-12 DIAGNOSIS — R29818 Other symptoms and signs involving the nervous system: Secondary | ICD-10-CM | POA: Diagnosis not present

## 2020-03-12 DIAGNOSIS — R293 Abnormal posture: Secondary | ICD-10-CM | POA: Diagnosis not present

## 2020-03-12 DIAGNOSIS — G8250 Quadriplegia, unspecified: Secondary | ICD-10-CM | POA: Diagnosis not present

## 2020-03-13 NOTE — Therapy (Signed)
[facility] [address] [place], [place], [address] Phone: [phone]   Fax:  [phone]  Physical Therapy Treatment  Patient Details  Name: Ryan Reed MRN: [id number] Date of Birth: [birth year] Referring Provider (PT): [name], MD   Encounter Date: 03/12/2020  PT End of Session - 03/13/20 1330    Visit Number  14    Number of Visits  25    Date for PT Re-Evaluation  04/20/20    Authorization Type  [employer]HC Medicare    PT Start Time  1147    PT Stop Time  1234    PT Time Calculation (min)  47 min    Equipment Utilized During Treatment  Gait belt;Other (comment)   Left Blue rocker AFO & shoe cover   Activity Tolerance  Patient tolerated treatment well    Behavior During Therapy  WFL for tasks assessed/performed       No past medical history on file.  Past Surgical History:  Procedure Laterality Date  . LACERATION REPAIR Left    left thigh  . THORACIC LAMINECTOMY FOR EPIDURAL ABSCESS N/A 10/14/2019   Procedure: Cervical seven - THORACIC seven LAMINECTOMIES FOR EPIDURAL ABSCESS;  Surgeon: [name], MD;  Location: [facility];  Service: Neurosurgery;  Laterality: N/A;    There were no vitals filed for this visit.  Subjective Assessment - 03/13/20 1258    Subjective  Pt states he had appt at [facility] on Wed. 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  No/denies    Pain Onset  More than a month ago                       [facility] Adult PT Treatment/Exercise - 03/13/20 0001      Ambulation/Gait   Ambulation/Gait  Yes    Ambulation/Gait Assistance  4: Min guard    Ambulation/Gait Assistance Details  Blue Rocker AFO and blue shoe cover on LLE/shoe    Ambulation Distance (Feet)  230 Feet    Assistive device  Rolling walker    Gait Pattern  Step-through pattern;Narrow base of support;Decreased hip/knee flexion - left;Poor foot clearance - left    Ambulation Surface  Level;Indoor    Gait Comments  pt gait trained inside // bars with RUE support 10' x 3 reps; no UE support 10' x 2 reps - mod assist x 1 due to Rt knee buckling; otherwise, min assist       Neuro Re-ed    Neuro Re-ed Details   pt stood inside // bars without UE support - horizontal head turns 5 reps with CGA; pt stood with EC for 10 secs with CGA      Knee/Hip Exercises: Aerobic   Recumbent Bike  SciFit level 2.5 x 3 1/2" with LE's only      Knee/Hip Exercises: Supine   Bridges  AROM;Both;1 set;5 reps    Straight Leg Raises  AROM;Right;Left;1 set;10 reps    Other Supine Knee/Hip Exercises  Bridging with LE extension 5 reps each leg; bridging with hip abdct/adduction 5 reps ; bridging with marching 5 reps  Knee/Hip Exercises: Sidelying   Hip ABduction  AROM;Right;Left;1 set;10 reps    Other Sidelying Knee/Hip Exercises  hip flexion and extension RLE and LLE 10 reps with manual resistance for both flexion/extension      Knee/Hip Exercises: Prone   Hamstring Curl  2 sets;10 reps   RLE and LLE   Hamstring Curl Limitations  assist with LLE for full ROM due to weakness    Hip Extension  AROM;Left;Right;1 set;10 reps   each leg - with knee flexed at 90 degrees            PT Education - 03/13/20 1324    Methods  Handout       PT Short Term Goals - 03/13/20 1422      PT SHORT TERM GOAL #1   Title  Pt will increase gait speed from 0.2m/s to >0.52m/s for improved household mobility.  ALL STGS DUE 03/20/20    Time  4    Period  Weeks    Status  New    Target Date  03/20/20      PT SHORT TERM GOAL #2   Title  Pt will perform sit <> stand from a mat table with RW and supervision - with single UE on RW and other UE on mat table for incr safety.    Baseline  CGA  with sit to stand from mat at walker    Time  4    Period  Weeks    Status  Revised      PT SHORT TERM GOAL #3   Title  Pt will decr 5x sit <> stand time from mat table at standard height to 25 seconds or less with min guard in order to demo improved BLE strength.    Baseline  28.44 seconds with min A    Time  4    Period  Weeks    Status  New      PT SHORT TERM GOAL #4   Title  Pt will ambulate at least 300' with RW and supervision in order to improve functional mobility.    Baseline  460' with RW CGA    Time  4    Period  Weeks    Status  Revised        PT Long Term Goals - 03/13/20 1422      PT LONG TERM GOAL #1   Title  Pt and pt's wife will be independent with final HEP for LE stretching and strengthening. ALL LTGS DUE 04/14/20    Time  12    Period  Weeks    Status  New      PT LONG TERM GOAL #2   Title  Pt will ambulate at least 230' with RW over paved surfaces and supervision in order to improve functional mobility.    Time  12    Period  Weeks    Status  Revised      PT LONG TERM GOAL #3   Title  Pt will decr 5x sit <> stand time from standard mat table to 21 seconds or less in order to demo improved functional BLE strength and improved transfer efficiency.    Time  12    Period  Weeks    Status  Revised      PT LONG TERM GOAL #4   Title  Pt will increase gait speed from 0.37m/s to >0.46m/s for improved household mobility.    Baseline  0.42m/s on 02/17/20  Time  12    Period  Weeks    Status  Revised      PT LONG TERM GOAL #5   Title  Pt will perform all transfers from manual w/c with mod I in order to improve independence.    Time  12    Period  Weeks    Status  New            Plan - 03/13/20 1333    Clinical Impression Statement  Pt performed gait training for first time today without UE support inside // bars - 10' x 2 reps without UE support; pt's Rt knee did buckle on 2nd rep, with mod assist given for recovery of LOB.  Pt progressing well  towards goals.  Lt hamstrings remain weak with minimal Lt knee flexion occurring in swing phase of gait.    Personal Factors and Comorbidities  Past/Current Experience    Examination-Activity Limitations  Bathing;Stand;Squat;Locomotion Level;Bend;Hygiene/Grooming    Examination-Participation Restrictions  Cleaning;Community Activity;Yard Work    Rehab Potential  Good    PT Frequency  2x / week    PT Duration  12 weeks    PT Treatment/Interventions  ADLs/Self Care Home Management;Aquatic Therapy;Electrical Stimulation;DME Instruction;Gait training;Stair training;Functional mobility training;Neuromuscular re-education;Balance training;Therapeutic exercise;Therapeutic activities;Patient/family education;Orthotic Fit/Training;Passive range of motion    PT Next Visit Plan  AT to be scheduled when schedule allows - AFO to be delivered in 2 weeks: Continue with strengthening, standing balance.  Gait training with left Blue Rocker AFO and shoe cover. BWS over treadmill.    [place] [id number] Code: [id number]: [email or web address]: 03/22/2021Prepared by: Vaughan Basta DildayExercisesBridge with Hip Abduction and Resistance - 1 x daily - 7 x weekly - 1 sets - 10 repsMarching Bridge - 1 x daily - 7 x weekly - 1 sets - 10 repsStraight Leg Raise - 1 x daily - 7 x weekly - 1 sets - 10 repsHIP EXTENSION CONTROL EXERCISE - 1 x daily - 7 x weekly - 1 sets - 10 repsSidelying Hip Abduction - 1 x daily - 7 x weekly - 1 sets - 10 repsProne Knee Flexion Extension AROM - 1 x daily - 7 x weekly - 3 sets - 10 repsProne Hip Extension with Bent Knee - 1 x daily - 7 x weekly - 1 sets - 10 reps    Consulted and Agree with Plan of Care  Patient;Family member/caregiver    Family Member Consulted  pt's [name]       Patient will benefit from skilled therapeutic intervention in order to improve the following deficits and impairments:  Abnormal gait, Decreased activity tolerance, Decreased  balance, Decreased endurance, Decreased coordination, Decreased mobility, Decreased range of motion, Difficulty walking, Decreased strength, Impaired flexibility, Impaired sensation, Postural dysfunction, Pain  Visit Diagnosis: Other abnormalities of gait and mobility  Muscle weakness (generalized)  Unsteadiness on feet     Problem List Patient Active Problem List   Diagnosis Date Noted  . Pressure injury of skin 11/11/2019  . Anemia   . Constipation   . Neurogenic bladder   . Neurogenic bowel   . Quadriplegia ([place]) 10/19/2019  . 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 Epidural abscess 10/14/2019    DildayJenness Corner, PT 03/13/2020, 2:25 PM  [facility] [address] [place], [place], [address] Phone: [phone]   Fax:  [phone]  Name: AZZAN NAVAS MRN: [id number] Date of Birth: 05/22/1951

## 2020-03-14 ENCOUNTER — Other Ambulatory Visit: Payer: Self-pay | Admitting: Physical Medicine and Rehabilitation

## 2020-03-17 ENCOUNTER — Other Ambulatory Visit: Payer: Self-pay

## 2020-03-17 ENCOUNTER — Ambulatory Visit: Payer: Medicare Other | Admitting: Physical Therapy

## 2020-03-17 DIAGNOSIS — R29818 Other symptoms and signs involving the nervous system: Secondary | ICD-10-CM | POA: Diagnosis not present

## 2020-03-17 DIAGNOSIS — M6281 Muscle weakness (generalized): Secondary | ICD-10-CM

## 2020-03-17 DIAGNOSIS — R2689 Other abnormalities of gait and mobility: Secondary | ICD-10-CM | POA: Diagnosis not present

## 2020-03-17 DIAGNOSIS — R2681 Unsteadiness on feet: Secondary | ICD-10-CM

## 2020-03-17 DIAGNOSIS — M21372 Foot drop, left foot: Secondary | ICD-10-CM | POA: Diagnosis not present

## 2020-03-17 DIAGNOSIS — R262 Difficulty in walking, not elsewhere classified: Secondary | ICD-10-CM | POA: Diagnosis not present

## 2020-03-17 DIAGNOSIS — R293 Abnormal posture: Secondary | ICD-10-CM | POA: Diagnosis not present

## 2020-03-18 NOTE — Therapy (Signed)
[facility] [address] [place], [place], [address] Phone: [phone]   Fax:  [phone]  Physical Therapy Treatment  Patient Details  Name: MAJESTIC BRISTER MRN: [id number] Date of Birth: [birth year] Referring Provider (PT): [name], MD   Encounter Date: 03/17/2020  PT End of Session - 03/18/20 2007    Visit Number  15    Number of Visits  25    Date for PT Re-Evaluation  04/20/20    Authorization Type  [employer]HC Medicare    PT Start Time  1148    PT Stop Time  1230    PT Time Calculation (min)  42 min    Equipment Utilized During Treatment  Gait belt;Other (comment)   Left Blue rocker AFO & shoe cover   Activity Tolerance  Patient tolerated treatment well    Behavior During Therapy  WFL for tasks assessed/performed       No past medical history on file.  Past Surgical History:  Procedure Laterality Date  . LACERATION REPAIR Left    left thigh  . THORACIC LAMINECTOMY FOR EPIDURAL ABSCESS N/A 10/14/2019   Procedure: Cervical seven - THORACIC seven LAMINECTOMIES FOR EPIDURAL ABSCESS;  Surgeon: [name], MD;  Location: [facility];  Service: Neurosurgery;  Laterality: N/A;    There were no vitals filed for this visit.      03/17/20 1150  Symptoms/Limitations  Subjective Pt has been walking around with his RW outdoors over the grass. Doing the standing and the exercises. Is having less muscle spasms, but notices at night it starts jerking around more. Will be getting his AFO and leather toe cap next week.  Patient is accompained by: Family member (wife [name])  Pertinent History hard of hearing, SCI (09/2019)  Patient Stated Goals wants to walk again,  Pain Assessment  Currently in Pain? No/denies  Pain Onset More than a month ago           03/17/20 0001  Transfers  Transfers Sit to Stand;Stand to Sit  Sit to Stand 4: Min guard  Five time sit to stand comments  22.81 seconds from lower mat  table using BUE support from mat and RW, no UE support once in standing  Stand to Sit 4: Min guard  Ambulation/Gait  Ambulation/Gait Yes  Ambulation/Gait Assistance 4: Min guard  Ambulation/Gait Assistance Details with blue rocker AFO and blue shoe cover used on LLE for incr foot clearance, pt continues to have decr knee flexion on LLE  Ambulation Distance (Feet) 300 Feet  Assistive device Rolling walker  Gait Pattern Step-through pattern;Narrow base of support;Decreased hip/knee flexion - left;Poor foot clearance - left  Ambulation Surface Level;Indoor  Exercises  Other Exercises  In quadraped position for incr core activation: 1 x 5 reps B alternating UE lifts, hip ABD step outs to R and then to L x5 reps each - min guard, performed alternating leg lifts for hip extension with therapist providing assistance 2 x 2-3 reps B. at edge of mat table: 2 x 5 reps mini squats with no/intermittent UE support (with RW anteriorly) - cues for glute activation in standing   Knee/Hip Exercises: Prone  Hamstring Curl 1 set;10 reps  Hamstring Curl Limitations therapist providing pressure to pt's pelvis to prevent from rotating. performed B, assist with LLE due to weakness, therapist providing manual resistance to R  Hip Extension AROM;Left;Right;1 set;10 reps (with knees flexed to 90 degrees)  Hip Extension Limitations cues to perform glute  squeeze first                    PT Education - 03/18/20 2007    Education Details  progress towards goals    Person(s) Educated  Patient    Methods  Explanation    Comprehension  Verbalized understanding       PT Short Term Goals - 03/17/20 1226      PT SHORT TERM GOAL #1   Title  Pt will increase gait speed from 0.46ms to >0.489m for improved household mobility.  ALL STGS DUE 03/20/20    Baseline  .28 m/s on 03/17/20    Time  4    Period  Weeks    Status  Not Met    Target Date  03/20/20      PT SHORT TERM GOAL #2   Title  Pt will perform  sit <> stand from a mat table with RW and supervision - with single UE on RW and other UE on mat table for incr safety.    Baseline  CGA with sit to stand from mat at walker    Time  4    Period  Weeks    Status  Partially Met      PT SHORT TERM GOAL #3   Title  Pt will decr 5x sit <> stand time from mat table at standard height to 25 seconds or less with min guard in order to demo improved BLE strength.    Baseline  22.81 seconds from lower mat table using BUE support from mat and RW, no UE support once in standing    Time  4    Period  Weeks    Status  Achieved      PT SHORT TERM GOAL #4   Title  Pt will ambulate at least 300' with RW and supervision in order to improve functional mobility.    Baseline  300' with RW and supervision/min guard    Time  4    Period  Weeks    Status  Partially Met        PT Long Term Goals - 03/18/20 2025      PT LONG TERM GOAL #1   Title  Pt and pt's wife will be independent with final HEP for LE stretching and strengthening. ALL LTGS DUE 04/14/20    Time  12    Period  Weeks    Status  New      PT LONG TERM GOAL #2   Title  Pt will ambulate at least 230' with RW over paved surfaces and supervision in order to improve functional mobility.    Time  12    Period  Weeks    Status  Revised      PT LONG TERM GOAL #3   Title  Pt will decr 5x sit <> stand time from standard mat table to 20 seconds or less in order to demo improved functional BLE strength and improved transfer efficiency.    Baseline  22.81 seconds    Time  12    Period  Weeks    Status  Revised      PT LONG TERM GOAL #4   Title  Pt will increase gait speed from 0.28 m/s to >0.45 m/s for improved household mobility.    Baseline  .28 m/s    Time  12    Period  Weeks    Status  Revised  PT LONG TERM GOAL #5   Title  Pt will perform all transfers from manual w/c with mod I in order to improve independence.    Time  12    Period  Weeks    Status  New             Plan - 03/18/20 2025    Clinical Impression Statement  Focus of today's skilled session was assessing pt's STGs; Pt has met STG #3 in regards to 5x sit <> stand, pt can now perform from a lower mat table in 22.81 seconds using BUE from mat table and then no UE support in standing at RW. Pt partially met STG #2 and #4, still needs CGA for sit <> stands and gait with RW. Pt's gait speed improved from .24 m/s to .28 m/s - indicating pt is still a household ambulator and not quite to goal speed with RW. LTGs revised as appropriate. Remainder of session focused on BLE and core strengthening. Pt is progressing well, will continue to progress towards LTGs.    Personal Factors and Comorbidities  Past/Current Experience    Examination-Activity Limitations  Bathing;Stand;Squat;Locomotion Level;Bend;Hygiene/Grooming    Examination-Participation Restrictions  Cleaning;Community Activity;Yard Work    Rehab Potential  Good    PT Frequency  2x / week    PT Duration  12 weeks    PT Treatment/Interventions  ADLs/Self Care Home Management;Aquatic Therapy;Electrical Stimulation;DME Instruction;Gait training;Stair training;Functional mobility training;Neuromuscular re-education;Balance training;Therapeutic exercise;Therapeutic activities;Patient/family education;Orthotic Fit/Training;Passive range of motion    PT Next Visit Plan  AT to be scheduled when schedule allows - AFO to be delivered next week: Continue with strengthening, standing balance.  Gait training with left Blue Rocker AFO and shoe cover. BWS over treadmill.    [place] [id number] Code: [email or web address]: [email or web address]: 03/22/2021Prepared by: Vaughan Basta DildayExercisesBridge with Hip Abduction and Resistance - 1 x daily - 7 x weekly - 1 sets - 10 repsMarching Bridge - 1 x daily - 7 x weekly - 1 sets - 10 repsStraight Leg Raise - 1 x daily - 7 x weekly - 1 sets - 10 repsHIP EXTENSION CONTROL EXERCISE -  1 x daily - 7 x weekly - 1 sets - 10 repsSidelying Hip Abduction - 1 x daily - 7 x weekly - 1 sets - 10 repsProne Knee Flexion Extension AROM - 1 x daily - 7 x weekly - 3 sets - 10 repsProne Hip Extension with Bent Knee - 1 x daily - 7 x weekly - 1 sets - 10 reps    Consulted and Agree with Plan of Care  Patient;Family member/caregiver    Family Member Consulted  pt's wife [name]       Patient will benefit from skilled therapeutic intervention in order to improve the following deficits and impairments:  Abnormal gait, Decreased activity tolerance, Decreased balance, Decreased endurance, Decreased coordination, Decreased mobility, Decreased range of motion, Difficulty walking, Decreased strength, Impaired flexibility, Impaired sensation, Postural dysfunction, Pain  Visit Diagnosis: Muscle weakness (generalized)  Other abnormalities of gait and mobility  Unsteadiness on feet  Other symptoms and signs involving the nervous system     Problem List Patient Active Problem List   Diagnosis Date Noted  . Pressure injury of skin 11/11/2019  . Anemia   . Constipation   . Neurogenic bladder   . Neurogenic bowel   . Quadriplegia ([place]) 10/19/2019  . 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 Epidural abscess 10/14/2019  Arliss Journey, PT, DPT  03/18/2020, 8:26 PM  [facility] [address] [place], [place], [address] Phone: [phone]   Fax:  [phone]  Name: ISAK SOTOMAYOR MRN: [id number] Date of Birth: 08-11-1951

## 2020-03-19 ENCOUNTER — Ambulatory Visit: Payer: Medicare Other | Admitting: Physical Therapy

## 2020-03-19 ENCOUNTER — Other Ambulatory Visit: Payer: Self-pay

## 2020-03-19 DIAGNOSIS — R29818 Other symptoms and signs involving the nervous system: Secondary | ICD-10-CM

## 2020-03-19 DIAGNOSIS — R293 Abnormal posture: Secondary | ICD-10-CM | POA: Diagnosis not present

## 2020-03-19 DIAGNOSIS — R2681 Unsteadiness on feet: Secondary | ICD-10-CM

## 2020-03-19 DIAGNOSIS — R262 Difficulty in walking, not elsewhere classified: Secondary | ICD-10-CM | POA: Diagnosis not present

## 2020-03-19 DIAGNOSIS — M21372 Foot drop, left foot: Secondary | ICD-10-CM | POA: Diagnosis not present

## 2020-03-19 DIAGNOSIS — M6281 Muscle weakness (generalized): Secondary | ICD-10-CM

## 2020-03-19 DIAGNOSIS — R2689 Other abnormalities of gait and mobility: Secondary | ICD-10-CM | POA: Diagnosis not present

## 2020-03-20 NOTE — Therapy (Signed)
[facility] [address] [place] [place], [place], [address] Phone: [phone]   Fax:  [phone]  Physical Therapy Treatment  Patient Details  Name: Ryan Reed MRN: [id number] Date of Birth: 07/19/1951 Referring Provider (PT): [name], MD   Encounter Date: 03/19/2020  PT End of Session - 03/20/20 0909    Visit Number  16    Number of Visits  25    Date for PT Re-Evaluation  04/20/20    Authorization Type  [employer] Medicare    PT Start Time  4270    PT Stop Time  1321    PT Time Calculation (min)  46 min    Equipment Utilized During Treatment  Gait belt;Other (comment)   Left Blue rocker AFO & shoe cover   Activity Tolerance  Patient tolerated treatment well    Behavior During Therapy  WFL for tasks assessed/performed       No past medical history on file.  Past Surgical History:  Procedure Laterality Date  . LACERATION REPAIR Left    left thigh  . THORACIC LAMINECTOMY FOR EPIDURAL ABSCESS N/A 10/14/2019   Procedure: Cervical seven - THORACIC seven LAMINECTOMIES FOR EPIDURAL ABSCESS;  Surgeon: [name], MD;  Location: [facility];  Service: Neurosurgery;  Laterality: N/A;    There were no vitals filed for this visit.  Subjective Assessment - 03/20/20 0852    Subjective  Pt states he has been walking in his yard "in the field" - states he knows it will be alot easier when he gets his AFO next Tuesday from [place]    Patient is accompained by:  Family member   wife [name]   Pertinent History  hard of hearing, SCI (09/2019)    Patient Stated Goals  wants to walk again,    Currently in Pain?  No/denies    Pain Onset  More than a month ago                       [facility] Adult PT Treatment/Exercise - 03/20/20 0001      Transfers   Transfers  Sit to Stand;Stand to Sit    Sit to Stand  4: Min guard   from mat to RW   Stand to Sit  4: Min guard   from RW to wheelchair     Ambulation/Gait    Ambulation/Gait  Yes    Ambulation/Gait Assistance  4: Min guard    Ambulation/Gait Assistance Details  with blue rocker AFO and blue shoe cover on LLE    Ambulation Distance (Feet)  115 Feet   50' total distance in // bars    Assistive device  Rolling walker;Parallel bars    Gait Pattern  Step-through pattern;Narrow base of support;Decreased hip/knee flexion - left;Poor foot clearance - left    Ambulation Surface  Level;Indoor    Stairs  Yes    Stairs Assistance  4: Min assist    Stairs Assistance Details (indicate cue type and reason)  Rt knee instability (mild buckling) occurred with negotiation of first step     Stair Management Technique  Two rails;Step to pattern;Forwards    Number of Stairs  4    Height of Stairs  6      Neuro Re-ed    Neuro Re-ed Details   pt stood inside // bars without UE support - horizontal head turns 5 reps with CGA; pt stood with EC for 10 secs with CGA  Exercises   Other Exercises   in quadruped position - moving each leg forward/backward for hip strengthening 5 reps each leg       Knee/Hip Exercises: Standing   Forward Step Up  Right;Left;1 set;10 reps;Hand Hold: 2;Step Height: 4"    Step Down  --   inside // bars     Knee/Hip Exercises: Supine   Bridges  AROM;Both;1 set;5 reps    Bridges with Clamshell  AROM;Both;1 set;5 reps    Other Supine Knee/Hip Exercises  bridging with marching 5 reps; bridging with LE extension 5 reps each leg:  bridging with legs on red physioball - lifting each leg 1 rep off bal prior to returning to starting position      Knee/Hip Exercises: Sidelying   Hip ABduction  AROM;Right;Left;1 set;10 reps    Other Sidelying Knee/Hip Exercises  Rt and Lt hip flexion/extension 10 reps with resistance; Lt knee flexion/extension 10 reps with minimal assistance for full ROM      Knee/Hip Exercises: Prone   Hamstring Curl  1 set;10 reps    Hip Extension  AROM;Right;Left;1 set   knee flexed at 90 degrees with hip extension               PT Short Term Goals - 03/20/20 0917      PT SHORT TERM GOAL #1   Title  Pt will increase gait speed from 0.74ms to >0.441m for improved household mobility.  ALL STGS DUE 03/20/20    Baseline  .28 m/s on 03/17/20    Time  4    Period  Weeks    Status  Not Met    Target Date  03/20/20      PT SHORT TERM GOAL #2   Title  Pt will perform sit <> stand from a mat table with RW and supervision - with single UE on RW and other UE on mat table for incr safety.    Baseline  CGA with sit to stand from mat at walker    Time  4    Period  Weeks    Status  Partially Met      PT SHORT TERM GOAL #3   Title  Pt will decr 5x sit <> stand time from mat table at standard height to 25 seconds or less with min guard in order to demo improved BLE strength.    Baseline  22.81 seconds from lower mat table using BUE support from mat and RW, no UE support once in standing    Time  4    Period  Weeks    Status  Achieved      PT SHORT TERM GOAL #4   Title  Pt will ambulate at least 300' with RW and supervision in order to improve functional mobility.    Baseline  300' with RW and supervision/min guard    Time  4    Period  Weeks    Status  Partially Met        PT Long Term Goals - 03/20/20 093662    PT LONG TERM GOAL #1   Title  Pt and pt's wife will be independent with final HEP for LE stretching and strengthening. ALL LTGS DUE 04/14/20    Time  12    Period  Weeks    Status  New      PT LONG TERM GOAL #2   Title  Pt will ambulate at least 230' with RW over paved surfaces and  supervision in order to improve functional mobility.    Time  12    Period  Weeks    Status  Revised      PT LONG TERM GOAL #3   Title  Pt will decr 5x sit <> stand time from standard mat table to 20 seconds or less in order to demo improved functional BLE strength and improved transfer efficiency.    Baseline  22.81 seconds    Time  12    Period  Weeks    Status  Revised      PT LONG TERM GOAL #4    Title  Pt will increase gait speed from 0.28 m/s to >0.45 m/s for improved household mobility.    Baseline  .28 m/s    Time  12    Period  Weeks    Status  Revised      PT LONG TERM GOAL #5   Title  Pt will perform all transfers from manual w/c with mod I in order to improve independence.    Time  12    Period  Weeks    Status  New            Plan - 03/19/20 1200    Clinical Impression Statement  Pt continues to progress well with increasing LE strength and also with balance and gait.  Pt is able to stand for approx. 10 secs without UE support but needs close object nearby to grab for assistance with balance recovery due to standing balance deficits and decreased hip & stepping strategy in balance recovery.  Pt improved with gait training inside // bars without UE support compared to performance with gait training with decr. support in last week's session.    Personal Factors and Comorbidities  Past/Current Experience    Examination-Activity Limitations  Bathing;Stand;Squat;Locomotion Level;Bend;Hygiene/Grooming    Examination-Participation Restrictions  Cleaning;Community Activity;Yard Work    Rehab Potential  Good    PT Frequency  2x / week    PT Duration  12 weeks    PT Treatment/Interventions  ADLs/Self Care Home Management;Aquatic Therapy;Electrical Stimulation;DME Instruction;Gait training;Stair training;Functional mobility training;Neuromuscular re-education;Balance training;Therapeutic exercise;Therapeutic activities;Patient/family education;Orthotic Fit/Training;Passive range of motion    PT Next Visit Plan  AFO to be delivered on Tues, May 4;  Continue with strengthening, standing balance.  Gait training with left Blue Rocker AFO and shoe cover - try gait inside // bars with decreased UE support    PT [place] Code: [email or web address]: [email or web address]: 03/22/2021Prepared by: Vaughan Basta DildayExercisesBridge with Hip Abduction  and Resistance - 1 x daily - 7 x weekly - 1 sets - 10 repsMarching Bridge - 1 x daily - 7 x weekly - 1 sets - 10 repsStraight Leg Raise - 1 x daily - 7 x weekly - 1 sets - 10 repsHIP EXTENSION CONTROL EXERCISE - 1 x daily - 7 x weekly - 1 sets - 10 repsSidelying Hip Abduction - 1 x daily - 7 x weekly - 1 sets - 10 repsProne Knee Flexion Extension AROM - 1 x daily - 7 x weekly - 3 sets - 10 repsProne Hip Extension with Bent Knee - 1 x daily - 7 x weekly - 1 sets - 10 reps    Consulted and Agree with Plan of Care  Patient;Family member/caregiver    Family Member Consulted  pt's wife [name]       Patient will benefit from skilled therapeutic intervention in order to improve the following deficits and impairments:  Abnormal gait, Decreased activity tolerance, Decreased balance, Decreased endurance, Decreased coordination, Decreased mobility, Decreased range of motion, Difficulty walking, Decreased strength, Impaired flexibility, Impaired sensation, Postural dysfunction, Pain  Visit Diagnosis: Muscle weakness (generalized)  Other abnormalities of gait and mobility  Other symptoms and signs involving the nervous system  Unsteadiness on feet     Problem List Patient Active Problem List   Diagnosis Date Noted  . Pressure injury of skin 11/11/2019  . Anemia   . Constipation   . Neurogenic bladder   . Neurogenic bowel   . Quadriplegia ([place]) 10/19/2019  . 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 Epidural abscess 10/14/2019    [place], PT 03/20/2020, 9:20 AM  [facility] [address] [place], [place], [address] Phone: [phone]   Fax:  [phone]  Name: QUINCEY QUESINBERRY MRN: [id number] Date of Birth: 11/23/1950

## 2020-03-23 DIAGNOSIS — R339 Retention of urine, unspecified: Secondary | ICD-10-CM | POA: Diagnosis not present

## 2020-03-23 DIAGNOSIS — N319 Neuromuscular dysfunction of bladder, unspecified: Secondary | ICD-10-CM | POA: Diagnosis not present

## 2020-03-23 DIAGNOSIS — G8250 Quadriplegia, unspecified: Secondary | ICD-10-CM | POA: Diagnosis not present

## 2020-03-24 ENCOUNTER — Other Ambulatory Visit: Payer: Self-pay

## 2020-03-24 ENCOUNTER — Encounter: Payer: Self-pay | Admitting: Physical Therapy

## 2020-03-24 ENCOUNTER — Ambulatory Visit: Payer: Medicare Other | Attending: Physical Medicine and Rehabilitation | Admitting: Physical Therapy

## 2020-03-24 DIAGNOSIS — R2689 Other abnormalities of gait and mobility: Secondary | ICD-10-CM | POA: Insufficient documentation

## 2020-03-24 DIAGNOSIS — R262 Difficulty in walking, not elsewhere classified: Secondary | ICD-10-CM | POA: Diagnosis not present

## 2020-03-24 DIAGNOSIS — R29818 Other symptoms and signs involving the nervous system: Secondary | ICD-10-CM | POA: Insufficient documentation

## 2020-03-24 DIAGNOSIS — R2681 Unsteadiness on feet: Secondary | ICD-10-CM | POA: Diagnosis not present

## 2020-03-24 DIAGNOSIS — M6281 Muscle weakness (generalized): Secondary | ICD-10-CM | POA: Diagnosis not present

## 2020-03-24 DIAGNOSIS — M21372 Foot drop, left foot: Secondary | ICD-10-CM | POA: Diagnosis not present

## 2020-03-24 NOTE — Therapy (Signed)
[facility] [address] [place], [place], [address] Phone: [phone]   Fax:  [phone]  Physical Therapy Treatment  Patient Details  Name: Ryan Reed MRN: [id number] Date of Birth: 15-Dec-1950 Referring Provider (PT): [name], MD   Encounter Date: 03/24/2020  PT End of Session - 03/24/20 2052    Visit Number  17    Number of Visits  25    Date for PT Re-Evaluation  04/20/20    Authorization Type  [employer]HC Medicare    PT Start Time  5597    PT Stop Time  1229    PT Time Calculation (min)  44 min    Equipment Utilized During Treatment  Gait belt;Other (comment)    Activity Tolerance  Patient tolerated treatment well    Behavior During Therapy  [facility] for tasks assessed/performed       History reviewed. No pertinent past medical history.  Past Surgical History:  Procedure Laterality Date  . LACERATION REPAIR Left    left thigh  . THORACIC LAMINECTOMY FOR EPIDURAL ABSCESS N/A 10/14/2019   Procedure: Cervical seven - THORACIC seven LAMINECTOMIES FOR EPIDURAL ABSCESS;  Surgeon: [name], MD;  Location: [facility];  Service: Neurosurgery;  Laterality: N/A;    There were no vitals filed for this visit.  Subjective Assessment - 03/24/20 1148    Subjective  Going to pick up his AFO from [facility] after today's session. Did a couple walks over the weekend.    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  No/denies    Pain Onset  More than a month ago                       [facility] Adult PT Treatment/Exercise - 03/24/20 0001      Transfers   Transfers  Sit to Stand;Stand to Sit    Sit to Stand  4: Min guard   from mat to RW   Stand to Sit  4: Min guard;Without upper extremity assist    Comments  reps of sit <> stands between standing balance, with focus on  eccentric control lowering to mat with no UE support      Neuro Re-ed    Neuro Re-ed Details   At edge of mat with RW anteriorly: Eyes closed on level ground 2 x 20 seconds,  Eyes closed on single pillow 3 x 20 seconds , 1 x 10 reps head turns, 1 x 10 reps head nods - close min guard for balance, eyes closed on foam x10 seconds - 4 reps of min guard/min A       Exercises   Exercises  Other Exercises    Other Exercises   In supine: LLE heel slides with sock x10 reps - with therapist assisting at times for pt to keep heel on mat, bridging with hip ABD (therapist providing resistance) x10 reps, with LLE on red physioball x10 reps hip and knee flexion/extension - with therapist assist with LLE in proper alignment. With BLE over red physioball in extension bridging x10 reps with 3 second hold at the top. Clamshells both R and L 2 x 10 reps with green theraband, cues for proper technique - upgraded to HEP. Sidelying hip ABD on L with AAROM 1 x 5  reps, on R 2 x 5 reps with therapist assist towards end of reps.       Knee/Hip Exercises: Standing   Other Standing Knee Exercises  At stairs with BUE support: step ups (with keeping LLE on step) x10 reps, with focus on activating L quads tapping RLE to second step and back down x5 reps                PT Short Term Goals - 03/20/20 0917      PT SHORT TERM GOAL #1   Title  Pt will increase gait speed from 0.10ms to >0.478m for improved household mobility.  ALL STGS DUE 03/20/20    Baseline  .28 m/s on 03/17/20    Time  4    Period  Weeks    Status  Not Met    Target Date  03/20/20      PT SHORT TERM GOAL #2   Title  Pt will perform sit <> stand from a mat table with RW and supervision - with single UE on RW and other UE on mat table for incr safety.    Baseline  CGA with sit to stand from mat at walker    Time  4    Period  Weeks    Status  Partially Met      PT SHORT TERM GOAL #3   Title  Pt will decr 5x sit <> stand time from mat table at  standard height to 25 seconds or less with min guard in order to demo improved BLE strength.    Baseline  22.81 seconds from lower mat table using BUE support from mat and RW, no UE support once in standing    Time  4    Period  Weeks    Status  Achieved      PT SHORT TERM GOAL #4   Title  Pt will ambulate at least 300' with RW and supervision in order to improve functional mobility.    Baseline  300' with RW and supervision/min guard    Time  4    Period  Weeks    Status  Partially Met        PT Long Term Goals - 03/20/20 098768    PT LONG TERM GOAL #1   Title  Pt and pt's wife will be independent with final HEP for LE stretching and strengthening. ALL LTGS DUE 04/14/20    Time  12    Period  Weeks    Status  New      PT LONG TERM GOAL #2   Title  Pt will ambulate at least 230' with RW over paved surfaces and supervision in order to improve functional mobility.    Time  12    Period  Weeks    Status  Revised      PT LONG TERM GOAL #3   Title  Pt will decr 5x sit <> stand time from standard mat table to 20 seconds or less in order to demo improved functional BLE strength and improved transfer efficiency.    Baseline  22.81 seconds    Time  12    Period  Weeks    Status  Revised      PT LONG TERM GOAL #4   Title  Pt will increase gait speed from 0.28 m/s to >0.45 m/s for improved household mobility.    Baseline  .28 m/s    Time  12  Period  Weeks    Status  Revised      PT LONG TERM GOAL #5   Title  Pt will perform all transfers from manual w/c with mod I in order to improve independence.    Time  12    Period  Weeks    Status  New            Plan - 03/24/20 2058    Clinical Impression Statement  Focus of today's skilled session was BLE strengthening and standing balance with no UE support. Pt able to stand on foam today for approx. 10 seconds with no UE support with min guard before needing to grab onto RW for balance. Pt will be going to [facility] after  session today to pick up L AFO and get leather toe cap put on shoe. Pt remains very motivated - will continue to progress towards LTGs.    Personal Factors and Comorbidities  Past/Current Experience    Examination-Activity Limitations  Bathing;Stand;Squat;Locomotion Level;Bend;Hygiene/Grooming    Examination-Participation Restrictions  Cleaning;Community Activity;Yard Work    Rehab Potential  Good    PT Frequency  2x / week    PT Duration  12 weeks    PT Treatment/Interventions  ADLs/Self Care Home Management;Aquatic Therapy;Electrical Stimulation;DME Instruction;Gait training;Stair training;Functional mobility training;Neuromuscular re-education;Balance training;Therapeutic exercise;Therapeutic activities;Patient/family education;Orthotic Fit/Training;Passive range of motion    PT Next Visit Plan  picked up AFO on may 4.  Continue with strengthening, standing balance.  Gait training with left Blue Rocker AFO and shoe cover - try gait inside // bars with decreased UE support    PT [place] Code: [email or web address]: [email or web address]: 03/22/2021Prepared by: Vaughan Basta DildayExercisesBridge with Hip Abduction and Resistance - 1 x daily - 7 x weekly - 1 sets - 10 repsMarching Bridge - 1 x daily - 7 x weekly - 1 sets - 10 repsStraight Leg Raise - 1 x daily - 7 x weekly - 1 sets - 10 repsHIP EXTENSION CONTROL EXERCISE - 1 x daily - 7 x weekly - 1 sets - 10 repsSidelying Hip Abduction - 1 x daily - 7 x weekly - 1 sets - 10 repsProne Knee Flexion Extension AROM - 1 x daily - 7 x weekly - 3 sets - 10 repsProne Hip Extension with Bent Knee - 1 x daily - 7 x weekly - 1 sets - 10 reps    Consulted and Agree with Plan of Care  Patient;Family member/caregiver    Family Member Consulted  pt's wife [name]       Patient will benefit from skilled therapeutic intervention in order to improve the following deficits and impairments:  Abnormal gait, Decreased activity  tolerance, Decreased balance, Decreased endurance, Decreased coordination, Decreased mobility, Decreased range of motion, Difficulty walking, Decreased strength, Impaired flexibility, Impaired sensation, Postural dysfunction, Pain  Visit Diagnosis: Muscle weakness (generalized)  Other abnormalities of gait and mobility  Other symptoms and signs involving the nervous system  Unsteadiness on feet  Difficulty in walking, not elsewhere classified     Problem List Patient Active Problem List   Diagnosis Date Noted  . Pressure injury of skin 11/11/2019  . Anemia   . Constipation   . Neurogenic bladder   . Neurogenic bowel   . Quadriplegia ([place]) 10/19/2019  . 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 Epidural abscess 10/14/2019    Arliss Journey, PT, DPT  03/24/2020, 9:12 PM  [facility] [address]  [place], [place], [address] Phone: [phone]   Fax:  [phone]  Name: SAIVION GOETTEL MRN: [id number] Date of Birth: 1951/04/11

## 2020-03-26 ENCOUNTER — Other Ambulatory Visit: Payer: Self-pay

## 2020-03-26 ENCOUNTER — Ambulatory Visit: Payer: Medicare Other | Admitting: Physical Therapy

## 2020-03-26 DIAGNOSIS — M6281 Muscle weakness (generalized): Secondary | ICD-10-CM

## 2020-03-26 DIAGNOSIS — R262 Difficulty in walking, not elsewhere classified: Secondary | ICD-10-CM | POA: Diagnosis not present

## 2020-03-26 DIAGNOSIS — R2681 Unsteadiness on feet: Secondary | ICD-10-CM

## 2020-03-26 DIAGNOSIS — R2689 Other abnormalities of gait and mobility: Secondary | ICD-10-CM

## 2020-03-26 DIAGNOSIS — R29818 Other symptoms and signs involving the nervous system: Secondary | ICD-10-CM | POA: Diagnosis not present

## 2020-03-27 ENCOUNTER — Encounter: Payer: Self-pay | Admitting: Physical Therapy

## 2020-03-27 NOTE — Therapy (Signed)
[facility] [address] [place], [place], [address] Phone: [phone]   Fax:  [phone]  Physical Therapy Treatment  Patient Details  Name: Ryan Reed MRN: [id number] Date of Birth: 10/05/1951 Referring Provider (PT): [name], MD   Encounter Date: 03/26/2020  PT End of Session - 03/27/20 1216    Visit Number  18    Number of Visits  25    Date for PT Re-Evaluation  04/20/20    Authorization Type  [employer] Medicare    PT Start Time  1150    PT Stop Time  1245    PT Time Calculation (min)  55 min    Equipment Utilized During Treatment  Gait belt;Other (comment)    Activity Tolerance  Patient tolerated treatment well    Behavior During Therapy  [facility] for tasks assessed/performed       History reviewed. No pertinent past medical history.  Past Surgical History:  Procedure Laterality Date  . LACERATION REPAIR Left    left thigh  . THORACIC LAMINECTOMY FOR EPIDURAL ABSCESS N/A 10/14/2019   Procedure: Cervical seven - THORACIC seven LAMINECTOMIES FOR EPIDURAL ABSCESS;  Surgeon: [name], MD;  Location: [place];  Service: Neurosurgery;  Laterality: N/A;    There were no vitals filed for this visit.  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  No/denies    Pain Onset  More than a month ago                       [facility] Adult PT Treatment/Exercise - 03/27/20 0001      Transfers   Transfers  Sit to Stand;Stand to Sit    Sit to Stand  4: Min guard    Stand to Sit  4: Min guard      Ambulation/Gait   Ambulation/Gait  Yes    Ambulation/Gait Assistance  4: Min guard    Ambulation Distance (Feet)  115 Feet    Assistive device  Rolling walker    Gait Pattern  Step-through pattern    Ambulation Surface  Level;Indoor    Stairs  Yes    Stairs Assistance  4: Min assist    Stairs Assistance Details (indicate cue type and reason)  pt ascends with RLE leading; descends with LLE leading - needs assistance to keep LLE from adducting during descension due to spasticity     Stair Management Technique  Two rails;Step to pattern;Forwards    Number of Stairs  4    Height of Stairs  6      Neuro Re-ed    Neuro Re-ed Details   Pt stood inside // bars - no UE support used - pt stood with EO and performed horizontal head turns 5 reps; vertical head turns 5 reps ; stood for 10 secs with EC 2 reps with CGA       Exercises   Exercises  Knee/Hip      Knee/Hip Exercises: Machines for Strengthening   Cybex Leg Press  50# bil. LE's 10 reps:  60# bil. LE's 10  reps: RLE only 10 reps 30#:  LLE only 10 reps 30#, 6 reps 25#       Knee/Hip Exercises: Supine   Bridges  AROM;Both;1 set;5 reps    Straight Leg Raises  AROM;Left;10 reps;1 set    Other Supine Knee/Hip Exercises  pt performed bridging with LE extension 5 reps each leg; bridging with hip abduction/adduction 5 reps; bridging with marching 5 reps       Knee/Hip Exercises: Sidelying   Hip ABduction  AAROM;Left;1 set;10 reps      Knee/Hip Exercises: Prone   Hamstring Curl  1 set;10 reps    Hip Extension  AROM;Left;1 set;10 reps   with knee flexed      Pt gait trained inside // bars without UE support forwards direction with CGA for safety - 10' x 4 reps (2 reps backwards with min. UE support on // bars)        PT Short Term Goals - 03/27/20 1217      PT SHORT TERM GOAL #1   Title  Pt will increase gait speed from 0.21ms to >0.448m for improved household mobility.  ALL STGS DUE 03/20/20    Baseline  .28 m/s on 03/17/20    Time  4    Period  Weeks    Status  Not Met    Target Date  03/20/20      PT SHORT TERM  GOAL #2   Title  Pt will perform sit <> stand from a mat table with RW and supervision - with single UE on RW and other UE on mat table for incr safety.    Baseline  CGA with sit to stand from mat at walker    Time  4    Period  Weeks    Status  Partially Met      PT SHORT TERM GOAL #3   Title  Pt will decr 5x sit <> stand time from mat table at standard height to 25 seconds or less with min guard in order to demo improved BLE strength.    Baseline  22.81 seconds from lower mat table using BUE support from mat and RW, no UE support once in standing    Time  4    Period  Weeks    Status  Achieved      PT SHORT TERM GOAL #4   Title  Pt will ambulate at least 300' with RW and supervision in order to improve functional mobility.    Baseline  300' with RW and supervision/min guard    Time  4    Period  Weeks    Status  Partially Met        PT Long Term Goals - 03/27/20 1217      PT LONG TERM GOAL #1   Title  Pt and pt's wife will be independent with final HEP for LE stretching and strengthening. ALL LTGS DUE 04/14/20    Time  12    Period  Weeks    Status  New      PT LONG TERM GOAL #2   Title  Pt will ambulate at least 230' with RW over paved surfaces and supervision in order to improve functional mobility.    Time  12    Period  Weeks    Status  Revised      PT LONG TERM GOAL #3   Title  Pt will decr 5x sit <> stand time from standard mat table to 20 seconds  or less in order to demo improved functional BLE strength and improved transfer efficiency.    Baseline  22.81 seconds    Time  12    Period  Weeks    Status  Revised      PT LONG TERM GOAL #4   Title  Pt will increase gait speed from 0.28 m/s to >0.45 m/s for improved household mobility.    Baseline  .28 m/s    Time  12    Period  Weeks    Status  Revised      PT LONG TERM GOAL #5   Title  Pt will perform all transfers from manual w/c with mod I in order to improve independence.    Time  12    Period  Weeks     Status  New            Plan - 03/27/20 1218    Clinical Impression Statement  Pt is progressing with gait as he walked into clinic with AFO on LLE for first time today, rather than using his wheelchair to enter and exit the clinic.  Pt continues to have decreased Lt knee flexion due to spasticity with minimal knee flexion of LLE in swing phase of gait - pt has leather toe cap on Lt shoe, however, Lt shoe continues to rub floor during swing through due to decreased Lt knee flexion and clearance of Lt foot.    Personal Factors and Comorbidities  Past/Current Experience    Examination-Activity Limitations  Bathing;Stand;Squat;Locomotion Level;Bend;Hygiene/Grooming    Examination-Participation Restrictions  Cleaning;Community Activity;Yard Work    Rehab Potential  Good    PT Frequency  2x / week    PT Duration  12 weeks    PT Treatment/Interventions  ADLs/Self Care Home Management;Aquatic Therapy;Electrical Stimulation;DME Instruction;Gait training;Stair training;Functional mobility training;Neuromuscular re-education;Balance training;Therapeutic exercise;Therapeutic activities;Patient/family education;Orthotic Fit/Training;Passive range of motion    PT Next Visit Plan  picked up AFO on may 4.  Continue with strengthening, standing balance.  Gait training with left [place] AFO and shoe cover - try gait inside // bars with decreased UE support    PT [place] Code: [email or web address]: [email or web address]: 03/22/2021Prepared by: Vaughan Basta DildayExercisesBridge with Hip Abduction and Resistance - 1 x daily - 7 x weekly - 1 sets - 10 repsMarching Bridge - 1 x daily - 7 x weekly - 1 sets - 10 repsStraight Leg Raise - 1 x daily - 7 x weekly - 1 sets - 10 repsHIP EXTENSION CONTROL EXERCISE - 1 x daily - 7 x weekly - 1 sets - 10 repsSidelying Hip Abduction - 1 x daily - 7 x weekly - 1 sets - 10 repsProne Knee Flexion Extension AROM - 1 x daily - 7 x weekly - 3  sets - 10 repsProne Hip Extension with Bent Knee - 1 x daily - 7 x weekly - 1 sets - 10 reps    Consulted and Agree with Plan of Care  Patient;Family member/caregiver    Family Member Consulted  pt's [name]       Patient will benefit from skilled therapeutic intervention in order to improve the following deficits and impairments:  Abnormal gait, Decreased activity tolerance, Decreased balance, Decreased endurance, Decreased coordination, Decreased mobility, Decreased range of motion, Difficulty walking, Decreased strength, Impaired flexibility, Impaired sensation, Postural dysfunction, Pain  Visit Diagnosis: Muscle weakness (generalized)  Other abnormalities of gait and mobility  Unsteadiness on feet     Problem List Patient  Active Problem List   Diagnosis Date Noted  . Pressure injury of skin 11/11/2019  . Anemia   . Constipation   . Neurogenic bladder   . Neurogenic bowel   . Quadriplegia ([place]) 10/19/2019  . 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 Epidural abscess 10/14/2019    Alda Lea, PT 03/27/2020, 12:41 PM  [facility] [address] [place], [place], [address] Phone: [phone]   Fax:  [phone]  Name: Ryan Reed MRN: [id number] Date of Birth: 09/28/1951

## 2020-03-31 ENCOUNTER — Ambulatory Visit: Payer: Medicare Other | Admitting: Physical Therapy

## 2020-03-31 ENCOUNTER — Other Ambulatory Visit: Payer: Self-pay

## 2020-03-31 DIAGNOSIS — M6281 Muscle weakness (generalized): Secondary | ICD-10-CM | POA: Diagnosis not present

## 2020-03-31 DIAGNOSIS — R2689 Other abnormalities of gait and mobility: Secondary | ICD-10-CM | POA: Diagnosis not present

## 2020-03-31 DIAGNOSIS — R262 Difficulty in walking, not elsewhere classified: Secondary | ICD-10-CM

## 2020-03-31 DIAGNOSIS — R2681 Unsteadiness on feet: Secondary | ICD-10-CM | POA: Diagnosis not present

## 2020-03-31 DIAGNOSIS — R29818 Other symptoms and signs involving the nervous system: Secondary | ICD-10-CM | POA: Diagnosis not present

## 2020-03-31 NOTE — Therapy (Signed)
[facility] [address] [place], [place], [address] Phone: [phone]   Fax:  [phone]  Physical Therapy Treatment  Patient Details  Name: Ryan Reed MRN: [id number] Date of Birth: [birth year] Referring Provider (PT): [name], MD   Encounter Date: 03/31/2020  PT End of Session - 03/31/20 2051    Visit Number  19    Number of Visits  25    Date for PT Re-Evaluation  04/20/20    Authorization Type  [employer]HC Medicare    PT Start Time  1145    PT Stop Time  1229    PT Time Calculation (min)  44 min    Equipment Utilized During Treatment  Gait belt    Activity Tolerance  Patient tolerated treatment well    Behavior During Therapy  [facility] for tasks assessed/performed       No past medical history on file.  Past Surgical History:  Procedure Laterality Date  . LACERATION REPAIR Left    left thigh  . THORACIC LAMINECTOMY FOR EPIDURAL ABSCESS N/A 10/14/2019   Procedure: Cervical seven - THORACIC seven LAMINECTOMIES FOR EPIDURAL ABSCESS;  Surgeon: [name], MD;  Location: [place];  Service: Neurosurgery;  Laterality: N/A;    There were no vitals filed for this visit.  Subjective Assessment - 03/31/20 1149    Subjective  No falls. Doing about 20 minutes of standing per day. Brace is feeling good, no redness.    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  --   numbness in buttocks area, but no pain   Pain Onset  More than a month ago                       [facility] Adult PT Treatment/Exercise - 03/31/20 0001      Transfers   Comments  pt needing cues for turning to sit on mat table - cues for step length and width as pt initially demonstrating pivoting on BLE with narrow BOS to sit down      Ambulation/Gait   Ambulation/Gait  Yes    Ambulation/Gait  Assistance  5: Supervision;4: Min guard    Ambulation/Gait Assistance Details  into and out of clinic    Assistive device  Rolling walker    Gait Pattern  Step-through pattern;Decreased hip/knee flexion - left    Ambulation Surface  Level;Indoor    Gait Comments  In // bars: forward walking x4 with min guard and intermittent UE support, backwards walking x4 with single/intermittent UE support with cues for weight shift and step length, side stepping down and back 3 reps with BUE support, cues for posture and foot clearance with LLE.       Exercises   Exercises  Other Exercises    Other Exercises   In prone: AAROM L knee flexion x10 reps. In quadraped position: with green theraband around thighs 2 x 7 reps B alternating resisted hip flexion (crawling forward and then back), 2 x 7 reps B alternating hip ABD.      Knee/Hip Exercises: Stretches   Sports administrator  Both;3 reps;30 seconds    Quad Stretch Limitations  in prone position       Knee/Hip Exercises: Standing   Other Standing Knee Exercises  At stair case  with BUE support: x10 reps forward step ups with LLE (keeping LLE on step) - verbal and tactile cues from therapist for a soft knee on LLE to prevent pt from locking, cues for stance on LLE and upright posture x10 reps toe taps with RLE                PT Short Term Goals - 03/27/20 1217      PT SHORT TERM GOAL #1   Title  Pt will increase gait speed from 0.68ms to >0.443m for improved household mobility.  ALL STGS DUE 03/20/20    Baseline  .28 m/s on 03/17/20    Time  4    Period  Weeks    Status  Not Met    Target Date  03/20/20      PT SHORT TERM GOAL #2   Title  Pt will perform sit <> stand from a mat table with RW and supervision - with single UE on RW and other UE on mat table for incr safety.    Baseline  CGA with sit to stand from mat at walker    Time  4    Period  Weeks    Status  Partially Met      PT SHORT TERM GOAL #3   Title  Pt will decr 5x sit <> stand  time from mat table at standard height to 25 seconds or less with min guard in order to demo improved BLE strength.    Baseline  22.81 seconds from lower mat table using BUE support from mat and RW, no UE support once in standing    Time  4    Period  Weeks    Status  Achieved      PT SHORT TERM GOAL #4   Title  Pt will ambulate at least 300' with RW and supervision in order to improve functional mobility.    Baseline  300' with RW and supervision/min guard    Time  4    Period  Weeks    Status  Partially Met        PT Long Term Goals - 03/27/20 1217      PT LONG TERM GOAL #1   Title  Pt and pt's wife will be independent with final HEP for LE stretching and strengthening. ALL LTGS DUE 04/14/20    Time  12    Period  Weeks    Status  New      PT LONG TERM GOAL #2   Title  Pt will ambulate at least 230' with RW over paved surfaces and supervision in order to improve functional mobility.    Time  12    Period  Weeks    Status  Revised      PT LONG TERM GOAL #3   Title  Pt will decr 5x sit <> stand time from standard mat table to 20 seconds or less in order to demo improved functional BLE strength and improved transfer efficiency.    Baseline  22.81 seconds    Time  12    Period  Weeks    Status  Revised      PT LONG TERM GOAL #4   Title  Pt will increase gait speed from 0.28 m/s to >0.45 m/s for improved household mobility.    Baseline  .28 m/s    Time  12    Period  Weeks    Status  Revised  PT LONG TERM GOAL #5   Title  Pt will perform all transfers from manual w/c with mod I in order to improve independence.    Time  12    Period  Weeks    Status  New            Plan - 03/31/20 2051    Clinical Impression Statement  Pt continues to have decr L knee flexion during swing phase of gait. Able to perform forward and retro gait in // bars today with only intermittent UE support and min guard, with pt with incr difficulty with LLE hip extension. Will continue to  progress towards LTGs.    Personal Factors and Comorbidities  Past/Current Experience    Examination-Activity Limitations  Bathing;Stand;Squat;Locomotion Level;Bend;Hygiene/Grooming    Examination-Participation Restrictions  Cleaning;Community Activity;Yard Work    Rehab Potential  Good    PT Frequency  2x / week    PT Duration  12 weeks    PT Treatment/Interventions  ADLs/Self Care Home Management;Aquatic Therapy;Electrical Stimulation;DME Instruction;Gait training;Stair training;Functional mobility training;Neuromuscular re-education;Balance training;Therapeutic exercise;Therapeutic activities;Patient/family education;Orthotic Fit/Training;Passive range of motion    PT Next Visit Plan  will need 10th visit progress note. give info about aquatics.  Continue with strengthening, standing balance.  Gait training - try gait inside // bars with decreased UE support    PT [place] Code: [email or web address]: [email or web address]: 03/22/2021Prepared by: Vaughan Basta DildayExercisesBridge with Hip Abduction and Resistance - 1 x daily - 7 x weekly - 1 sets - 10 repsMarching Bridge - 1 x daily - 7 x weekly - 1 sets - 10 repsStraight Leg Raise - 1 x daily - 7 x weekly - 1 sets - 10 repsHIP EXTENSION CONTROL EXERCISE - 1 x daily - 7 x weekly - 1 sets - 10 repsSidelying Hip Abduction - 1 x daily - 7 x weekly - 1 sets - 10 repsProne Knee Flexion Extension AROM - 1 x daily - 7 x weekly - 3 sets - 10 repsProne Hip Extension with Bent Knee - 1 x daily - 7 x weekly - 1 sets - 10 reps    Consulted and Agree with Plan of Care  Patient;Family member/caregiver    Family Member Consulted  pt's wife [name]       Patient will benefit from skilled therapeutic intervention in order to improve the following deficits and impairments:  Abnormal gait, Decreased activity tolerance, Decreased balance, Decreased endurance, Decreased coordination, Decreased mobility, Decreased range of motion,  Difficulty walking, Decreased strength, Impaired flexibility, Impaired sensation, Postural dysfunction, Pain  Visit Diagnosis: Muscle weakness (generalized)  Unsteadiness on feet  Other abnormalities of gait and mobility  Other symptoms and signs involving the nervous system  Difficulty in walking, not elsewhere classified     Problem List Patient Active Problem List   Diagnosis Date Noted  . Pressure injury of skin 11/11/2019  . Anemia   . Constipation   . Neurogenic bladder   . Neurogenic bowel   . Quadriplegia ([place]) 10/19/2019  . 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 Epidural abscess 10/14/2019    Arliss Journey , PT, DPT  03/31/2020, 9:00 PM  [facility] [address] [place], [place], [address] Phone: [phone]   Fax:  [phone]  Name: PELHAM HENNICK MRN: [id number] Date of Birth: June 21, 1951

## 2020-04-01 DIAGNOSIS — G8250 Quadriplegia, unspecified: Secondary | ICD-10-CM | POA: Diagnosis not present

## 2020-04-02 ENCOUNTER — Other Ambulatory Visit: Payer: Self-pay

## 2020-04-02 ENCOUNTER — Encounter: Payer: Self-pay | Admitting: Physical Therapy

## 2020-04-02 ENCOUNTER — Ambulatory Visit: Payer: Medicare Other | Admitting: Physical Therapy

## 2020-04-02 DIAGNOSIS — R2681 Unsteadiness on feet: Secondary | ICD-10-CM

## 2020-04-02 DIAGNOSIS — M6281 Muscle weakness (generalized): Secondary | ICD-10-CM

## 2020-04-02 DIAGNOSIS — R2689 Other abnormalities of gait and mobility: Secondary | ICD-10-CM

## 2020-04-02 DIAGNOSIS — R29818 Other symptoms and signs involving the nervous system: Secondary | ICD-10-CM | POA: Diagnosis not present

## 2020-04-02 DIAGNOSIS — R262 Difficulty in walking, not elsewhere classified: Secondary | ICD-10-CM | POA: Diagnosis not present

## 2020-04-03 NOTE — Therapy (Signed)
[facility] [address] [place], [place], [address] Phone: [phone]   Fax:  [phone]  Physical Therapy Treatment & 10th Visit Progress Note  Patient Details  Name: Ryan Reed MRN: [id number] Date of Birth: 1950/11/30 Referring Provider (PT): [name], MD   Encounter Date: 04/02/2020  PT End of Session - 04/03/20 1121    Visit Number  20    Number of Visits  25    Date for PT Re-Evaluation  04/20/20    Authorization Type  [employer] Medicare    PT Start Time  1150    PT Stop Time  1235    PT Time Calculation (min)  45 min    Equipment Utilized During Treatment  Gait belt    Activity Tolerance  Patient tolerated treatment well    Behavior During Therapy  [facility] for tasks assessed/performed       History reviewed. No pertinent past medical history.  Past Surgical History:  Procedure Laterality Date  . LACERATION REPAIR Left    left thigh  . THORACIC LAMINECTOMY FOR EPIDURAL ABSCESS N/A 10/14/2019   Procedure: Cervical seven - THORACIC seven LAMINECTOMIES FOR EPIDURAL ABSCESS;  Surgeon: [name], MD;  Location: [facility];  Service: Neurosurgery;  Laterality: N/A;    There were no vitals filed for this visit.  Subjective Assessment - 04/02/20 1606    Subjective  Pt states LLE has been really tight today - did some stretching this am to help loosen it up    Patient is accompained by:  Family member   wife [name]   Pertinent History  hard of hearing, SCI (09/2019)    Patient Stated Goals  wants to walk again,    Currently in Pain?  No/denies    Pain Onset  More than a month ago                        [facility] Adult PT Treatment/Exercise - 04/03/20 0001      Transfers   Transfers  Sit to Stand    Sit to Stand  4: Min guard    Stand to Sit  5: Supervision      Ambulation/Gait   Ambulation/Gait  Yes    Ambulation/Gait Assistance  5: Supervision    Ambulation Distance (Feet)  100  Feet   x 2 reps   Assistive device  Rolling walker    Gait Pattern  Step-through pattern    Stairs  Yes    Stairs Assistance  4: Min guard    Stair Management Technique  Two rails;Step to pattern;Forwards    Number of Stairs  4    Height of Stairs  6    Gait Comments  pt gait trained inside // bars 10' x 4 reps forwarrds; 10' x 2 reps backwards with UE support prn on // bars (minimal)       Knee/Hip Exercises: Stretches   Active Hamstring Stretch  Both;1 rep;30 seconds   runner's stretch on step     Knee/Hip Exercises: Sidelying   Hip ABduction  AAROM;Left   10 reps   Clams  10 reps LLE    Other Sidelying Knee/Hip Exercises  Lt knee flexion/extension with skateboard on powderboard - 30 reps with min assist for full ROM with flexion            Knee/Hip Exercises: Prone   Hamstring Curl  2 sets;10 reps   LLE - AAROM  Hip Extension  AROM;Left   10 reps   Other Prone Exercises  Lt hip extension with Lt knee flexed at 90 degrees - 2 sets 10 reps         Neuro Re-ed:  Tall kneeling on mat on floor - pt performed partial sitting back towards heels for bil. Quad stretching 10 reps Rt and Lt 1/2 kneeling with UE support on mat prn - head turns 3 reps side to side and up/down in each position Walking forwards/backwards in tall kneeling with UE support prn with CGA - 3 reps on red mat on floor - for improved core stabilization and Hip strengthening & stability   Pt transferred mat to/from floor with CGA with bil. UE support in each transfer      PT Short Term Goals - 04/03/20 1129      PT SHORT TERM GOAL #1   Title  Pt will increase gait speed from 0.67ms to >0.478m for improved household mobility.  ALL STGS DUE 03/20/20    Baseline  .28 m/s on 03/17/20    Time  4    Period  Weeks    Status  Not Met    Target Date  03/20/20      PT SHORT TERM GOAL #2   Title  Pt will perform sit <> stand from a mat table with RW and supervision - with single UE on RW and other UE on mat table  for incr safety.    Baseline  CGA with sit to stand from mat at walker    Time  4    Period  Weeks    Status  Partially Met      PT SHORT TERM GOAL #3   Title  Pt will decr 5x sit <> stand time from mat table at standard height to 25 seconds or less with min guard in order to demo improved BLE strength.    Baseline  22.81 seconds from lower mat table using BUE support from mat and RW, no UE support once in standing    Time  4    Period  Weeks    Status  Achieved      PT SHORT TERM GOAL #4   Title  Pt will ambulate at least 300' with RW and supervision in order to improve functional mobility.    Baseline  300' with RW and supervision/min guard    Time  4    Period  Weeks    Status  Partially Met        PT Long Term Goals - 04/03/20 1129      PT LONG TERM GOAL #1   Title  Pt and pt's wife will be independent with final HEP for LE stretching and strengthening. ALL LTGS DUE 04/14/20    Time  12    Period  Weeks    Status  New      PT LONG TERM GOAL #2   Title  Pt will ambulate at least 230' with RW over paved surfaces and supervision in order to improve functional mobility.    Time  12    Period  Weeks    Status  Revised      PT LONG TERM GOAL #3   Title  Pt will decr 5x sit <> stand time from standard mat table to 20 seconds or less in order to demo improved functional BLE strength and improved transfer efficiency.    Baseline  22.81 seconds    Time  12    Period  Weeks    Status  Revised      PT LONG TERM GOAL #4   Title  Pt will increase gait speed from 0.28 m/s to >0.45 m/s for improved household mobility.    Baseline  .28 m/s    Time  12    Period  Weeks    Status  Revised      PT LONG TERM GOAL #5   Title  Pt will perform all transfers from manual w/c with mod I in order to improve independence.    Time  12    Period  Weeks    Status  New            Plan - 04/03/20 1122    Clinical Impression Statement  This 10th visit progress note covers dates  02-27-20 - 04-02-20.  All STG's have been met or partially met; pt has received AFO for LLE and is now ambulating into clinic with RW, rather than using his manual wheelchair for entering/exiting clinic for PT sessions.  Pt continues to have increased spasticity in LLE with minimal active Lt knee flexion in swing phase of gait.  Pt able to maintain static standing balance without head turns for approx. 2" but needs minimal UE support when head turns are performed.  Pt is progressing towards LTG's.    Personal Factors and Comorbidities  Past/Current Experience    Examination-Activity Limitations  Bathing;Stand;Squat;Locomotion Level;Bend;Hygiene/Grooming    Examination-Participation Restrictions  Cleaning;Community Activity;Yard Work    Rehab Potential  Good    PT Frequency  2x / week    PT Duration  12 weeks    PT Treatment/Interventions  ADLs/Self Care Home Management;Aquatic Therapy;Electrical Stimulation;DME Instruction;Gait training;Stair training;Functional mobility training;Neuromuscular re-education;Balance training;Therapeutic exercise;Therapeutic activities;Patient/family education;Orthotic Fit/Training;Passive range of motion    PT Next Visit Plan  Continue with strengthening, standing balance.  Gait training - try gait inside // bars with decreased UE support    PT [place] Code: [email or web address]: [email or web address]: 03/22/2021Prepared by: Vaughan Basta DildayExercisesBridge with Hip Abduction and Resistance - 1 x daily - 7 x weekly - 1 sets - 10 repsMarching Bridge - 1 x daily - 7 x weekly - 1 sets - 10 repsStraight Leg Raise - 1 x daily - 7 x weekly - 1 sets - 10 repsHIP EXTENSION CONTROL EXERCISE - 1 x daily - 7 x weekly - 1 sets - 10 repsSidelying Hip Abduction - 1 x daily - 7 x weekly - 1 sets - 10 repsProne Knee Flexion Extension AROM - 1 x daily - 7 x weekly - 3 sets - 10 repsProne Hip Extension with Bent Knee - 1 x daily - 7 x weekly - 1 sets - 10  reps    Consulted and Agree with Plan of Care  Patient;Family member/caregiver    Family Member Consulted  pt's wife [name]       Patient will benefit from skilled therapeutic intervention in order to improve the following deficits and impairments:  Abnormal gait, Decreased activity tolerance, Decreased balance, Decreased endurance, Decreased coordination, Decreased mobility, Decreased range of motion, Difficulty walking, Decreased strength, Impaired flexibility, Impaired sensation, Postural dysfunction, Pain  Visit Diagnosis: Muscle weakness (generalized)  Other abnormalities of gait and mobility  Unsteadiness on feet     Problem List Patient Active Problem List   Diagnosis Date Noted  . Pressure injury of skin 11/11/2019  . Anemia   . Constipation   . Neurogenic bladder   .  Neurogenic bowel   . Quadriplegia ([place]) 10/19/2019  . 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 Epidural abscess 10/14/2019    Alda Lea, PT 04/03/2020, 11:31 AM  [facility] [address] [place], [place], [address] Phone: [phone]   Fax:  [phone]  Name: BRIAR WITHERSPOON MRN: [id number] Date of Birth: 07-13-1951

## 2020-04-05 ENCOUNTER — Other Ambulatory Visit: Payer: Self-pay | Admitting: Internal Medicine

## 2020-04-05 DIAGNOSIS — G061 Intraspinal abscess and granuloma: Secondary | ICD-10-CM

## 2020-04-06 ENCOUNTER — Other Ambulatory Visit: Payer: Self-pay

## 2020-04-06 ENCOUNTER — Ambulatory Visit: Payer: Medicare Other | Admitting: Physical Therapy

## 2020-04-06 DIAGNOSIS — R2681 Unsteadiness on feet: Secondary | ICD-10-CM

## 2020-04-06 DIAGNOSIS — R2689 Other abnormalities of gait and mobility: Secondary | ICD-10-CM | POA: Diagnosis not present

## 2020-04-06 DIAGNOSIS — R29818 Other symptoms and signs involving the nervous system: Secondary | ICD-10-CM | POA: Diagnosis not present

## 2020-04-06 DIAGNOSIS — R262 Difficulty in walking, not elsewhere classified: Secondary | ICD-10-CM | POA: Diagnosis not present

## 2020-04-06 DIAGNOSIS — M6281 Muscle weakness (generalized): Secondary | ICD-10-CM | POA: Diagnosis not present

## 2020-04-07 ENCOUNTER — Ambulatory Visit: Payer: Medicare Other | Admitting: Physical Therapy

## 2020-04-07 ENCOUNTER — Encounter: Payer: Self-pay | Admitting: Physical Therapy

## 2020-04-07 NOTE — Therapy (Signed)
[facility] [address] [place], [place], [address] Phone: [phone]   Fax:  [phone]  Physical Therapy Treatment  Patient Details  Name: Ryan Reed MRN: [id number] Date of Birth: 31-Jan-1951 Referring Provider (PT): [name], MD   Encounter Date: 04/06/2020  PT End of Session - 04/07/20 2127    Visit Number  21    Number of Visits  25    Date for PT Re-Evaluation  04/20/20    Authorization Type  [employer]HC Medicare    PT Start Time  1325    PT Stop Time  1415    PT Time Calculation (min)  50 min    Equipment Utilized During Treatment  --   flotation belt and water noodle   Activity Tolerance  Patient tolerated treatment well    Behavior During Therapy  [facility] for tasks assessed/performed       History reviewed. No pertinent past medical history.  Past Surgical History:  Procedure Laterality Date  . LACERATION REPAIR Left    left thigh  . THORACIC LAMINECTOMY FOR EPIDURAL ABSCESS N/A 10/14/2019   Procedure: Cervical seven - THORACIC seven LAMINECTOMIES FOR EPIDURAL ABSCESS;  Surgeon: [name], MD;  Location: [facility];  Service: Neurosurgery;  Laterality: N/A;    There were no vitals filed for this visit.  Subjective Assessment - 04/07/20 2125    Subjective  Pt presents for aquatic therapy at [facility] - in manual wheelchair today for safety with mobility after session    Patient is accompained by:  Family member   wife [name]   Pertinent History  hard of hearing, SCI (09/2019)    Patient Stated Goals  wants to walk again,    Currently in Pain?  No/denies    Pain Onset  More than a month ago        Aquatic therapy at [facility] - pool temp 87.6 degrees   Patient seen for aquatic therapy today.  Treatment took place in water 2.5-4 feet deep depending upon activity.  Pt entered and exited  the pool via ramp negotiation with use of bil. Hand rails with CGA.  Pt performed hamstring and heel cord stretches  (runner's stretch) for each leg - 1 rep each - 30 sec hold  Pt gait trained approx. 62[place]ross pool (6 reps total during session) with mod assist initially with pt holding noodle, stabilized by PT; progressing To min assist for stabilization of noodle, with pt holding onto noodle for UE support  Pt performed bil. Hip flexion, extension and abduction 10 reps each with UE support on side of pool; hip & knee flexion with assistance to flex Lt knee 10 reps RLE - hip and knee flexion 10 reps  Pt performed supine exercises - flotation belt used for flotation  - bicycling LE's with pt flexing Lt knee as much as possible; hip abduction/adduction 3 sets 10 reps   Modified leg press - pt supported by PT - feet on wall - pushed into hip and knee flexion for closed chain strengthening 10 reps  Standing balance activity - standing statically without UE support with CGA; head turns horizontally 5 reps and vertically 5 reps  Modified Ai Chi posture with pt braced against pool wall for stability and assist with balance - soothing posture 10 reps    Pt requires the buoyancy of water for active assisted exercises with buoyancy supported for strengthening and requires the viscosity of the water for strengthening Gait training  without device can be performed with minimal fall risk in the water, which is unable for pt to perform on land with the reduced fall risk Water current provides perturbations which challenge standing balance unsupported                      PT Short Term Goals - 04/07/20 2135      PT SHORT TERM GOAL #1   Title  Pt will increase gait speed from 0.41ms to >0.418m for improved household mobility.  ALL STGS DUE 03/20/20    Baseline  .28 m/s on 03/17/20    Time  4    Period  Weeks    Status  Not Met    Target Date  03/20/20      PT SHORT TERM GOAL #2   Title  Pt will perform sit <> stand from a mat table with RW and supervision - with single UE on RW and other UE on  mat table for incr safety.    Baseline  CGA with sit to stand from mat at walker    Time  4    Period  Weeks    Status  Partially Met      PT SHORT TERM GOAL #3   Title  Pt will decr 5x sit <> stand time from mat table at standard height to 25 seconds or less with min guard in order to demo improved BLE strength.    Baseline  22.81 seconds from lower mat table using BUE support from mat and RW, no UE support once in standing    Time  4    Period  Weeks    Status  Achieved      PT SHORT TERM GOAL #4   Title  Pt will ambulate at least 300' with RW and supervision in order to improve functional mobility.    Baseline  300' with RW and supervision/min guard    Time  4    Period  Weeks    Status  Partially Met        PT Long Term Goals - 04/07/20 2135      PT LONG TERM GOAL #1   Title  Pt and pt's wife will be independent with final HEP for LE stretching and strengthening. ALL LTGS DUE 04/14/20    Time  12    Period  Weeks    Status  New      PT LONG TERM GOAL #2   Title  Pt will ambulate at least 230' with RW over paved surfaces and supervision in order to improve functional mobility.    Time  12    Period  Weeks    Status  Revised      PT LONG TERM GOAL #3   Title  Pt will decr 5x sit <> stand time from standard mat table to 20 seconds or less in order to demo improved functional BLE strength and improved transfer efficiency.    Baseline  22.81 seconds    Time  12    Period  Weeks    Status  Revised      PT LONG TERM GOAL #4   Title  Pt will increase gait speed from 0.28 m/s to >0.45 m/s for improved household mobility.    Baseline  .28 m/s    Time  12    Period  Weeks    Status  Revised      PT LONG TERM GOAL #5  Title  Pt will perform all transfers from manual w/c with mod I in order to improve independence.    Time  12    Period  Weeks    Status  New            Plan - 04/07/20 2128    Clinical Impression Statement  Aquatic therapy session focused on LE  strengthening, gait training, and standing balance.  Pt tolerated aquatic exercises well with difficulty flexing Lt knee due to extensor tone & spasticity.  Decreased UE support noted during gait training from mod assist given at start of session to min assist for bil. UE support in 2nd half of treatment session.  Current of water produced perturbations which challenged pt's standing balance - pt stated exercises in water were more challenging than what he anticipated.    Personal Factors and Comorbidities  Past/Current Experience    Examination-Activity Limitations  Bathing;Stand;Squat;Locomotion Level;Bend;Hygiene/Grooming    Examination-Participation Restrictions  Cleaning;Community Activity;Yard Work    Rehab Potential  Good    PT Frequency  2x / week    PT Duration  12 weeks    PT Treatment/Interventions  ADLs/Self Care Home Management;Aquatic Therapy;Electrical Stimulation;DME Instruction;Gait training;Stair training;Functional mobility training;Neuromuscular re-education;Balance training;Therapeutic exercise;Therapeutic activities;Patient/family education;Orthotic Fit/Training;Passive range of motion    PT Next Visit Plan  Continue with strengthening, standing balance.  Gait training - try gait inside // bars with decreased UE support    PT [place] Code: [email or web address]: [email or web address]: 03/22/2021Prepared by: Vaughan Basta DildayExercisesBridge with Hip Abduction and Resistance - 1 x daily - 7 x weekly - 1 sets - 10 repsMarching Bridge - 1 x daily - 7 x weekly - 1 sets - 10 repsStraight Leg Raise - 1 x daily - 7 x weekly - 1 sets - 10 repsHIP EXTENSION CONTROL EXERCISE - 1 x daily - 7 x weekly - 1 sets - 10 repsSidelying Hip Abduction - 1 x daily - 7 x weekly - 1 sets - 10 repsProne Knee Flexion Extension AROM - 1 x daily - 7 x weekly - 3 sets - 10 repsProne Hip Extension with Bent Knee - 1 x daily - 7 x weekly - 1 sets - 10 reps    Consulted and  Agree with Plan of Care  Patient;Family member/caregiver    Family Member Consulted  pt's wife [name]       Patient will benefit from skilled therapeutic intervention in order to improve the following deficits and impairments:  Abnormal gait, Decreased activity tolerance, Decreased balance, Decreased endurance, Decreased coordination, Decreased mobility, Decreased range of motion, Difficulty walking, Decreased strength, Impaired flexibility, Impaired sensation, Postural dysfunction, Pain  Visit Diagnosis: Muscle weakness (generalized)  Other abnormalities of gait and mobility  Unsteadiness on feet  Other symptoms and signs involving the nervous system     Problem List Patient Active Problem List   Diagnosis Date Noted  . Pressure injury of skin 11/11/2019  . Anemia   . Constipation   . Neurogenic bladder   . Neurogenic bowel   . Quadriplegia ([place]) 10/19/2019  . 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 Epidural abscess 10/14/2019    Ewen Varnell, [place], [place], [place] 04/07/2020, 9:37 PM  [facility] [address] [place], [place], [address] Phone: [phone]   Fax:  [phone]  Name: RAIFE LIZER MRN: [id number] Date of Birth: 1951-08-05

## 2020-04-09 ENCOUNTER — Ambulatory Visit: Payer: Medicare Other | Admitting: Physical Therapy

## 2020-04-09 ENCOUNTER — Other Ambulatory Visit: Payer: Self-pay

## 2020-04-09 DIAGNOSIS — M6281 Muscle weakness (generalized): Secondary | ICD-10-CM | POA: Diagnosis not present

## 2020-04-09 DIAGNOSIS — R2689 Other abnormalities of gait and mobility: Secondary | ICD-10-CM | POA: Diagnosis not present

## 2020-04-09 DIAGNOSIS — R2681 Unsteadiness on feet: Secondary | ICD-10-CM

## 2020-04-09 DIAGNOSIS — R262 Difficulty in walking, not elsewhere classified: Secondary | ICD-10-CM | POA: Diagnosis not present

## 2020-04-09 DIAGNOSIS — R29818 Other symptoms and signs involving the nervous system: Secondary | ICD-10-CM | POA: Diagnosis not present

## 2020-04-10 ENCOUNTER — Encounter: Payer: Self-pay | Admitting: Physical Therapy

## 2020-04-10 NOTE — Therapy (Signed)
[facility] [address] [place], [place], [address] Phone: [phone]   Fax:  [phone]  Physical Therapy Treatment  Patient Details  Name: Ryan Reed MRN: [id number] Date of Birth: [birth year] Referring Provider (PT): Ryan Moore, MD   Encounter Date: 04/09/2020  PT End of Session - 04/10/20 1008    Visit Number  22    Number of Visits  25    Date for PT Re-Evaluation  04/20/20    Authorization Type  [employer]HC Medicare    PT Start Time  1151    PT Stop Time  1237    PT Time Calculation (min)  46 min    Equipment Utilized During Treatment  Gait belt   flotation belt and water noodle   Activity Tolerance  Patient tolerated treatment well    Behavior During Therapy  [facility] for tasks assessed/performed       History reviewed. No pertinent past medical history.  Past Surgical History:  Procedure Laterality Date  . LACERATION REPAIR Left    left thigh  . THORACIC LAMINECTOMY FOR EPIDURAL ABSCESS N/A 10/14/2019   Procedure: Cervical seven - THORACIC seven LAMINECTOMIES FOR EPIDURAL ABSCESS;  Surgeon: Ryan Moore, MD;  Location: [place];  Service: Neurosurgery;  Laterality: N/A;    There were no vitals filed for this visit.  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  Yes    Pain Score  7     Pain Location  Back   low back & hip areas   Pain Orientation  Right;Left;Lower    Pain Descriptors / Indicators  Discomfort;Sore    Pain Type  Acute pain    Pain Onset   Yesterday    Pain Frequency  Constant    Aggravating Factors   no specific factors    Pain Relieving Factors  stretching                        [facility] Adult PT Treatment/Exercise - 04/10/20 0001      Ambulation/Gait   Ambulation/Gait  Yes    Ambulation/Gait Assistance  4: Min assist;3: Mod assist   mod assist with fatigue due to difficulty advancing LLE   Ambulation/Gait Assistance Details  AFO on LLE with shoe cover on Lt foot for increased ease with swing thru    Ambulation Distance (Feet)  115 Feet    Assistive device  Large base quad cane    Gait Pattern  Step-through pattern    Stairs  Yes    Stairs Assistance  4: Min guard    Stair Management Technique  Two rails;Step to pattern;Forwards    Number of Stairs  4    Height of Stairs  6    Gait Comments  pt gait trained inside // bars 10' x 6 reps forwards; 10' x 3 reps backwards with UE support prn on //  bars (minimal)       Exercises   Exercises  Lumbar;Knee/Hip      Lumbar Exercises: Stretches   Other Lumbar Stretch Exercise  pt performed low back stretch - in seated position - hands on blue physioball in front of pt - rolling ball forward for low back stretch - 30 sec hold, then to Rt side 30 sec hold and to Lt side 30 sec hold      Knee/Hip Exercises: Standing   Forward Step Up  Right;Left;1 set;10 reps;Step Height: 6";Hand Hold: 2    Other Standing Knee Exercises  tap ups with RLE to 1st step 10 reps, then RLE to 2nd step 10 reps with minimal UE support for improved LLE SLS and closed chain LLE strengthening      Knee/Hip Exercises: Supine   Bridges  AROM;Both   5 reps   Bridges with Clamshell  AROM;Both;5 reps    Single Leg Bridge  AROM;Left;10 reps;1 set    Straight Leg Raises  AROM;Left;10 reps;1 set    Other Supine Knee/Hip Exercises  bridging with LE extension - 10 reps each leg; bridging with marching 5 reps    Other Supine Knee/Hip Exercises  Lt hip extension with knee flexed at 90 degrees -  lifting on/off side of mat table 2# 10 reps       Knee/Hip Exercises: Sidelying   Hip ABduction  AAROM;Left   10 reps   Clams  10 reps LLE; 2# weight       Knee/Hip Exercises: Prone   Hamstring Curl  1 set;10 reps   LLE   Other Prone Exercises  Lt hip extension with Lt knee flexed at 90 degrees - 10 reps                PT Short Term Goals - 04/10/20 1136      PT SHORT TERM GOAL #1   Title  Pt will increase gait speed from 0.80ms to >0.464m for improved household mobility.  ALL STGS DUE 03/20/20    Baseline  .28 m/s on 03/17/20    Time  4    Period  Weeks    Status  Not Met    Target Date  03/20/20      PT SHORT TERM GOAL #2   Title  Pt will perform sit <> stand from a mat table with RW and supervision - with single UE on RW and other UE on mat table for incr safety.    Baseline  CGA with sit to stand from mat at walker    Time  4    Period  Weeks    Status  Partially Met      PT SHORT TERM GOAL #3   Title  Pt will decr 5x sit <> stand time from mat table at standard height to 25 seconds or less with min guard in order to demo improved BLE strength.    Baseline  22.81 seconds from lower mat table using BUE support from mat and RW, no UE support once in standing    Time  4    Period  Weeks    Status  Achieved      PT SHORT TERM GOAL #4   Title  Pt will ambulate at least 300' with RW and supervision in order to improve functional mobility.    Baseline  300' with RW and supervision/min guard    Time  4    Period  Weeks    Status  Partially Met        PT Long Term Goals - 04/10/20 1138      PT LONG TERM GOAL #1   Title  Pt and pt's wife will be independent with final HEP for LE stretching and strengthening. ALL LTGS DUE 04/14/20    Time  12    Period  Weeks    Status  New      PT LONG TERM GOAL #2   Title  Pt will ambulate at least 230' with RW over paved surfaces and supervision in order to improve functional mobility.    Time  12    Period  Weeks     Status  Revised      PT LONG TERM GOAL #3   Title  Pt will decr 5x sit <> stand time from standard mat table to 20 seconds or less in order to demo improved functional BLE strength and improved transfer efficiency.    Baseline  22.81 seconds    Time  12    Period  Weeks    Status  Revised      PT LONG TERM GOAL #4   Title  Pt will increase gait speed from 0.28 m/s to >0.45 m/s for improved household mobility.    Baseline  .28 m/s    Time  12    Period  Weeks    Status  Revised      PT LONG TERM GOAL #5   Title  Pt will perform all transfers from manual w/c with mod I in order to improve independence.    Time  12    Period  Weeks    Status  New            Plan - 04/10/20 1010    Clinical Impression Statement  Pt demonstrating improvement in Lt knee flexion as pt able to initiate and actively flex Lt knee through partial ROM with bridging/LLE extension with all 10 reps for first time - demonstrating decreased tone/spasticity in hamstrings.  Pt gait trained with use of LBQC for first time - required min to mod assist with fatigue with Lt knee flexion decreasing with fatigue and increased spasticity.    Personal Factors and Comorbidities  Past/Current Experience    Examination-Activity Limitations  Bathing;Stand;Squat;Locomotion Level;Bend;Hygiene/Grooming    Examination-Participation Restrictions  Cleaning;Community Activity;Yard Work    Rehab Potential  Good    PT Frequency  2x / week    PT Duration  12 weeks    PT Treatment/Interventions  ADLs/Self Care Home Management;Aquatic Therapy;Electrical Stimulation;DME Instruction;Gait training;Stair training;Functional mobility training;Neuromuscular re-education;Balance training;Therapeutic exercise;Therapeutic activities;Patient/family education;Orthotic Fit/Training;Passive range of motion    PT Next Visit Plan  Continue with strengthening, standing balance.  Gait training - try gait inside // bars with decreased UE support    PT  Walker Code: [email or web address]: [email or web address]: 03/22/2021Prepared by: Vaughan Basta DildayExercisesBridge with Hip Abduction and Resistance - 1 x daily - 7 x weekly - 1 sets - 10 repsMarching Bridge - 1 x daily - 7 x weekly - 1 sets - 10 repsStraight Leg Raise - 1 x daily - 7 x weekly - 1 sets - 10 repsHIP EXTENSION CONTROL EXERCISE - 1 x daily - 7 x weekly - 1 sets - 10 repsSidelying Hip Abduction - 1 x daily - 7 x weekly - 1 sets - 10 repsProne Knee Flexion Extension AROM - 1 x daily - 7 x weekly - 3 sets - 10 repsProne Hip Extension  with Bent Knee - 1 x daily - 7 x weekly - 1 sets - 10 reps    Consulted and Agree with Plan of Care  Patient;Family member/caregiver    Family Member Consulted  pt's [name]       Patient will benefit from skilled therapeutic intervention in order to improve the following deficits and impairments:  Abnormal gait, Decreased activity tolerance, Decreased balance, Decreased endurance, Decreased coordination, Decreased mobility, Decreased range of motion, Difficulty walking, Decreased strength, Impaired flexibility, Impaired sensation, Postural dysfunction, Pain  Visit Diagnosis: Muscle weakness (generalized)  Other abnormalities of gait and mobility  Unsteadiness on feet     Problem List Patient Active Problem List   Diagnosis Date Noted  . Pressure injury of skin 11/11/2019  . Anemia   . Constipation   . Neurogenic bladder   . Neurogenic bowel   . Quadriplegia ([place]) 10/19/2019  . 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 Epidural abscess 10/14/2019    Alda Lea, PT 04/10/2020, 11:41 AM  [facility] [address] [place], [place], [address] Phone: [phone]   Fax:  [phone]  Name: Ryan Reed MRN: [id number] Date of Birth: 13-Jun-1951

## 2020-04-11 DIAGNOSIS — G8250 Quadriplegia, unspecified: Secondary | ICD-10-CM | POA: Diagnosis not present

## 2020-04-13 ENCOUNTER — Encounter: Payer: Self-pay | Admitting: Physical Therapy

## 2020-04-13 ENCOUNTER — Other Ambulatory Visit: Payer: Self-pay

## 2020-04-13 ENCOUNTER — Ambulatory Visit: Payer: Medicare Other | Admitting: Physical Therapy

## 2020-04-13 DIAGNOSIS — R262 Difficulty in walking, not elsewhere classified: Secondary | ICD-10-CM | POA: Diagnosis not present

## 2020-04-13 DIAGNOSIS — R2681 Unsteadiness on feet: Secondary | ICD-10-CM

## 2020-04-13 DIAGNOSIS — R29818 Other symptoms and signs involving the nervous system: Secondary | ICD-10-CM | POA: Diagnosis not present

## 2020-04-13 DIAGNOSIS — R2689 Other abnormalities of gait and mobility: Secondary | ICD-10-CM | POA: Diagnosis not present

## 2020-04-13 DIAGNOSIS — M6281 Muscle weakness (generalized): Secondary | ICD-10-CM | POA: Diagnosis not present

## 2020-04-13 NOTE — Therapy (Signed)
[facility] [address] [place], [place], [address] Phone: [phone]   Fax:  [phone]  Physical Therapy Treatment  Patient Details  Name: Ryan Reed MRN: [id number] Date of Birth: December 04, 1950 Referring Provider (PT): [name], MD   Encounter Date: 04/13/2020  PT End of Session - 04/13/20 2108    Visit Number  23    Number of Visits  25    Date for PT Re-Evaluation  04/20/20    Authorization Type  [employer]HC Medicare    PT Start Time  1330    PT Stop Time  1415    PT Time Calculation (min)  45 min       History reviewed. No pertinent past medical history.  Past Surgical History:  Procedure Laterality Date  . LACERATION REPAIR Left    left thigh  . THORACIC LAMINECTOMY FOR EPIDURAL ABSCESS N/A 10/14/2019   Procedure: Cervical seven - THORACIC seven LAMINECTOMIES FOR EPIDURAL ABSCESS;  Surgeon: [name], MD;  Location: [facility];  Service: Neurosurgery;  Laterality: N/A;    There were no vitals filed for this visit.  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  Yes    Pain Score  3     Pain Location  Back   back & hips   Pain Orientation  Right;Left    Pain Descriptors / Indicators  Discomfort;Sore;Tightness    Pain Type  Chronic pain    Pain Onset  More than a month ago    Pain Frequency  Intermittent          Aquatic therapy at [facility] - pool temp 87.6 degrees   Patient seen for aquatic therapy today.  Treatment took place in water 3.5-4 feet deep depending upon activity.  Pt entered and exited  the pool via ramp negotiation with use of bil. Hand rails with CGA.  Pt performed hamstring  stretch (runner's stretch) for each leg - 1 rep each - 30 sec hold  Pt gait trained approx. 81macross pool  4 reps with mod assist initially with pt holding noodle, stabilized by PT; progressing To min assist for stabilization of noodle, with pt holding onto noodle for UE support; sideways amb. 2 reps approx. 163mcross pool with mod assist on pool noodle stabilized by PT  Squats x 10 reps with bil. UE support on pool wall:  Unilateral squats on each leg - 10 reps RLE, 10 reps LLE with UE support with CGA for closed chain strengthening   Pt performed bil. Hip flexion, extension and abduction 10 reps each with use of buoyant ankle cuff with UE support on side of pool; 2 cuffs placed on LLE to increase buoyancy in attempt to increase Lt knee active flexion  Pt performed supine exercises - flotation belt used for flotation  - bicycling LE's with pt flexing Lt knee as much as possible; hip abduction/adduction 2 sets 10 reps   Modified leg press - pt supported by PT - feet on wall - pushed into hip and knee flexion for closed chain strengthening 10 reps  Standing balance  activity - standing statically without UE support with CGA; head turns horizontally 5 reps and vertically 5 reps  Modified Ai Chi posture with CGA for  balance - soothing posture 10 reps - pt was not braced against pool wall today as he was in last week's session   Pt requires the buoyancy of water for active assisted exercises with buoyancy supported for strengthening & ROM exercises: pt requires the viscosity of the water for resistance with strengthening exercises Gait training without device can be performed in water with minimal fall risk in the water, which is unable for pt to perform on land with the reduced fall risk Water current provides perturbations which challenge standing balance unsupported                             PT Short Term Goals - 04/13/20 2121      PT SHORT TERM GOAL #1   Title   Pt will increase gait speed from 0.12ms to >0.425m for improved household mobility.  ALL STGS DUE 03/20/20    Baseline  .28 m/s on 03/17/20    Time  4    Period  Weeks    Status  Not Met    Target Date  03/20/20      PT SHORT TERM GOAL #2   Title  Pt will perform sit <> stand from a mat table with RW and supervision - with single UE on RW and other UE on mat table for incr safety.    Baseline  CGA with sit to stand from mat at walker    Time  4    Period  Weeks    Status  Partially Met      PT SHORT TERM GOAL #3   Title  Pt will decr 5x sit <> stand time from mat table at standard height to 25 seconds or less with min guard in order to demo improved BLE strength.    Baseline  22.81 seconds from lower mat table using BUE support from mat and RW, no UE support once in standing    Time  4    Period  Weeks    Status  Achieved      PT SHORT TERM GOAL #4   Title  Pt will ambulate at least 300' with RW and supervision in order to improve functional mobility.    Baseline  300' with RW and supervision/min guard    Time  4    Period  Weeks    Status  Partially Met        PT Long Term Goals - 04/13/20 2121      PT LONG TERM GOAL #1   Title  Pt and pt's wife will be independent with final HEP for LE stretching and strengthening. ALL LTGS DUE 04/14/20    Time  12    Period  Weeks    Status  New      PT LONG TERM GOAL #2   Title  Pt will ambulate at least 230' with RW over paved surfaces and supervision in order to improve functional mobility.    Time  12    Period  Weeks    Status  Revised      PT LONG TERM GOAL #3   Title  Pt will decr 5x sit <> stand time from standard mat table to 20 seconds or less in order to demo improved functional BLE strength and  improved transfer efficiency.    Baseline  22.81 seconds    Time  12    Period  Weeks    Status  Revised      PT LONG TERM GOAL #4   Title  Pt will increase gait speed from 0.28 m/s to >0.45 m/s for improved household mobility.     Baseline  .28 m/s    Time  12    Period  Weeks    Status  Revised      PT LONG TERM GOAL #5   Title  Pt will perform all transfers from manual w/c with mod I in order to improve independence.    Time  12    Period  Weeks    Status  New            Plan - 04/13/20 2112    Clinical Impression Statement  Pt continues to have difficulty flexing Lt knee due to extensor tone and spasticity; some slight increase in Lt knee flexion noted today compared to initial aquatic therapy session last week.  Pt also improved with gait training in water with continued bil. UE support needed on pool noodle stabilized by PT but pt able to hold onto rail in water with LUE and amb. approx. 54' with SBA in exiting pool at end of session.  Bouyant cuffs used for first time for strengthening eccentrically.    Personal Factors and Comorbidities  Past/Current Experience    Examination-Activity Limitations  Bathing;Stand;Squat;Locomotion Level;Bend;Hygiene/Grooming    Examination-Participation Restrictions  Cleaning;Community Activity;Yard Work    Rehab Potential  Good    PT Frequency  2x / week    PT Duration  12 weeks    PT Treatment/Interventions  ADLs/Self Care Home Management;Aquatic Therapy;Electrical Stimulation;DME Instruction;Gait training;Stair training;Functional mobility training;Neuromuscular re-education;Balance training;Therapeutic exercise;Therapeutic activities;Patient/family education;Orthotic Fit/Training;Passive range of motion    PT Next Visit Plan  Check LTG's (due  5-25) - renew -- Continue with strengthening, standing balance.  Gait training & balance training    [place] Code: [email or web address]: [email or web address]: 03/22/2021Prepared by: Vaughan Basta DildayExercisesBridge with Hip Abduction and Resistance - 1 x daily - 7 x weekly - 1 sets - 10 repsMarching Bridge - 1 x daily - 7 x weekly - 1 sets - 10 repsStraight Leg Raise - 1 x daily - 7 x  weekly - 1 sets - 10 repsHIP EXTENSION CONTROL EXERCISE - 1 x daily - 7 x weekly - 1 sets - 10 repsSidelying Hip Abduction - 1 x daily - 7 x weekly - 1 sets - 10 repsProne Knee Flexion Extension AROM - 1 x daily - 7 x weekly - 3 sets - 10 repsProne Hip Extension with Bent Knee - 1 x daily - 7 x weekly - 1 sets - 10 reps    Consulted and Agree with Plan of Care  Patient;Family member/caregiver    Family Member Consulted  pt's [name]       Patient will benefit from skilled therapeutic intervention in order to improve the following deficits and impairments:  Abnormal gait, Decreased activity tolerance, Decreased balance, Decreased endurance, Decreased coordination, Decreased mobility, Decreased range of motion, Difficulty walking, Decreased strength, Impaired flexibility, Impaired sensation, Postural dysfunction, Pain  Visit Diagnosis: Muscle weakness (generalized)  Other abnormalities of gait and mobility  Unsteadiness on feet     Problem List Patient Active Problem List   Diagnosis Date Noted  . Pressure injury of skin 11/11/2019  . Anemia   .  Constipation   . Neurogenic bladder   . Neurogenic bowel   . Quadriplegia ([place]) 10/19/2019  . 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 Epidural abscess 10/14/2019    Maycie Luera, [place], [place], [place] 04/13/2020, 9:24 PM  [facility] [address] [place], [place], [address] Phone: [phone]   Fax:  [phone]  Name: SYAIR FRICKER MRN: [id number] Date of Birth: Aug 14, 1951

## 2020-04-14 ENCOUNTER — Ambulatory Visit: Payer: Medicare Other

## 2020-04-14 DIAGNOSIS — H903 Sensorineural hearing loss, bilateral: Secondary | ICD-10-CM | POA: Diagnosis not present

## 2020-04-14 DIAGNOSIS — H6123 Impacted cerumen, bilateral: Secondary | ICD-10-CM | POA: Diagnosis not present

## 2020-04-16 ENCOUNTER — Ambulatory Visit: Payer: Medicare Other | Admitting: Physical Therapy

## 2020-04-17 ENCOUNTER — Ambulatory Visit: Payer: Medicare Other | Admitting: Physical Therapy

## 2020-04-21 DIAGNOSIS — G8250 Quadriplegia, unspecified: Secondary | ICD-10-CM | POA: Diagnosis not present

## 2020-04-21 DIAGNOSIS — N319 Neuromuscular dysfunction of bladder, unspecified: Secondary | ICD-10-CM | POA: Diagnosis not present

## 2020-04-21 DIAGNOSIS — R339 Retention of urine, unspecified: Secondary | ICD-10-CM | POA: Diagnosis not present

## 2020-04-23 ENCOUNTER — Ambulatory Visit: Payer: Medicare Other | Attending: Physical Medicine and Rehabilitation | Admitting: Physical Therapy

## 2020-04-23 ENCOUNTER — Other Ambulatory Visit: Payer: Self-pay

## 2020-04-23 DIAGNOSIS — R2689 Other abnormalities of gait and mobility: Secondary | ICD-10-CM | POA: Diagnosis not present

## 2020-04-23 DIAGNOSIS — R2681 Unsteadiness on feet: Secondary | ICD-10-CM | POA: Diagnosis not present

## 2020-04-23 DIAGNOSIS — R29818 Other symptoms and signs involving the nervous system: Secondary | ICD-10-CM | POA: Insufficient documentation

## 2020-04-23 DIAGNOSIS — M6281 Muscle weakness (generalized): Secondary | ICD-10-CM | POA: Diagnosis not present

## 2020-04-24 DIAGNOSIS — H903 Sensorineural hearing loss, bilateral: Secondary | ICD-10-CM | POA: Diagnosis not present

## 2020-04-24 NOTE — Therapy (Signed)
[facility] [address] [place], [place], [address] Phone: [phone]   Fax:  [phone]  Physical Therapy Treatment  Patient Details  Name: Ryan Reed MRN: [id number] Date of Birth: 1951-11-18 Referring Provider (PT): [name], MD   Encounter Date: 04/23/2020  PT End of Session - 04/24/20 1423    Visit Number  24    Number of Visits  40    Date for PT Re-Evaluation  06/26/20    Authorization Type  UHC Medicare    Authorization Time Period  04-23-20 - 07-21-20    PT Start Time  1535    PT Stop Time  1621    PT Time Calculation (min)  46 min    Equipment Utilized During Treatment  Gait belt    Activity Tolerance  Patient tolerated treatment well    Behavior During Therapy  [facility] for tasks assessed/performed       No past medical history on file.  Past Surgical History:  Procedure Laterality Date  . LACERATION REPAIR Left    left thigh  . THORACIC LAMINECTOMY FOR EPIDURAL ABSCESS N/A 10/14/2019   Procedure: Cervical seven - THORACIC seven LAMINECTOMIES FOR EPIDURAL ABSCESS;  Surgeon: [name], MD;  Location: [facility];  Service: Neurosurgery;  Laterality: N/A;    There were no vitals filed for this visit.  Subjective Assessment - 04/24/20 1412    Subjective  Pt states his left knee is starting to bend a little bit more when he walks    Patient is accompained by:  Family member   [name]   Pertinent History  hard of hearing, SCI (09/2019)    Patient Stated Goals  wants to walk again,    Currently in Pain?  No/denies    Pain Onset  More than a month ago                        [facility] Adult PT Treatment/Exercise - 04/24/20 0001      Ambulation/Gait   Ambulation/Gait  Yes    Ambulation/Gait Assistance  4: Min guard   UE support prn with LOB inside // bars   Ambulation/Gait Assistance Details  AFO on LLE    Ambulation Distance (Feet)  60 Feet    Assistive device  Parallel  bars    Gait Pattern  Step-through pattern    Stairs  Yes    Stairs Assistance  4: Min guard    Stair Management Technique  Two rails;Step to pattern;Forwards;Alternating pattern   step to on 1st rep; step over step on 2nd rep   Number of Stairs  8   4 steps x 2 reps   Height of Stairs  6      Neuro Re-ed    Neuro Re-ed Details   Pt stood at counter - without UE support - performed slow head turns side to side 5 reps; stepped forward and back with Rt foot 5 reps and then stepped out and in with Rt foot 5 reps with CGA to min assist for recovery of LOB      Exercises   Exercises  Knee/Hip      Lumbar Exercises: Stretches   Passive Hamstring Stretch  Left;2 reps;20 seconds   with contract/relax     Knee/Hip Exercises: Standing   Forward Step Up  Right;Left;1 set;10 reps;Step Height: 6";Hand Hold: 2      Knee/Hip Exercises: Supine   Heel Slides  AAROM;Left;1 set;10 reps    Bridges  AROM;Both   5 reps   Other Supine Knee/Hip Exercises  bridging with LE extension - 10 reps each leg; bridging with marching 5 reps      Knee/Hip Exercises: Sidelying   Hip ABduction  AAROM;Left;2 sets   10 reps   Other Sidelying Knee/Hip Exercises  Lt hip flexion and extension with manual moderate resistance 10 reps                PT Short Term Goals - 04/24/20 1438      PT SHORT TERM GOAL #1   Title  Pt will increase gait speed from 0.40ms to > .8 m/s for improved household mobility.  ALL STGS DUE 05-23-20    Baseline  .28 m/s on 03/17/20    Time  4    Period  Weeks    Status  New    Target Date  05/22/20      PT SHORT TERM GOAL #2   Title  [facility].    Time  4    Period  Weeks    Status  New    Target Date  05/22/20      PT SHORT TERM GOAL #3   Title  Pt will decr 5x sit <> stand time from mat table at standard height to 20 seconds or less with min guard in order to demo improved BLE strength.    Baseline  22.81  seconds from lower mat table using BUE support from mat and RW, no UE support once in standing    Time  4    Period  Weeks    Status  Revised    Target Date  05/22/20      PT SHORT TERM GOAL #4   Title  Pt will stand for 2" without UE support at counter with SBA for incr. independence with ADL's.    Time  4    Period  Weeks    Status  New    Target Date  05/22/20        PT Long Term Goals - 04/24/20 1425      PT LONG TERM GOAL #1   Title  Pt and pt's wife will be independent with final HEP for LE stretching and strengthening. ALL LTGS DUE 06-26-20: revised LTG - independent in updated HEP to include aquatic exs.    Baseline  --    Time  8    Period  Weeks    Status  Revised    Target Date  06/26/20      PT LONG TERM GOAL #2   Title  Pt will ambulate 100' with LBQC modified independently  with AFO on LLE for increased household accessibility    Baseline  --    Time  8    Period  Weeks    Status  New    Target Date  06/26/20      PT LONG TERM GOAL #3   Title  Pt will decr 5x sit <> stand time from standard mat table to 16 seconds or less in order to demo improved functional BLE strength and improved transfer efficiency.    Baseline  22.81 seconds    Time  12    Period  Weeks    Status  New    Target Date  06/26/20      PT LONG TERM GOAL #4   Title  Pt will increase gait speed from 0.28 m/s to >1.0 m/s with RW for improved household mobility.    Baseline  .28 m/s    Time  8    Period  Weeks    Status  New    Target Date  06/26/20      PT LONG TERM GOAL #5   Title  Pt will negotiate 4 steps with bil. UE support on hand rails modified independently using a step over step sequence.    Baseline  --    Time  12    Period  Weeks    Status  Achieved    Target Date  06/26/20            Plan - 04/24/20 1503    Clinical Impression Statement  Pt has met LTG's #1, 2 & 5:  LTG's #3 & 4 remain ongoing as goals not fully met due to spasticity in LLE which impacts gait  pattern & standing balance>  Pt continues to use RW for asstist. w/amb. for shrot community distances & uses w/c for prolonged distances.  Pt is progressing with improvements in gait, balance & LE strength noted    Personal Factors and Comorbidities  Past/Current Experience    Examination-Activity Limitations  Bathing;Stand;Squat;Locomotion Level;Bend;Hygiene/Grooming    Examination-Participation Restrictions  Cleaning;Community Activity;Yard Work    Rehab Potential  Good    PT Frequency  2x / week    PT Duration  8 weeks    PT Treatment/Interventions  ADLs/Self Care Home Management;Aquatic Therapy;Electrical Stimulation;DME Instruction;Gait training;Stair training;Functional mobility training;Neuromuscular re-education;Balance training;Therapeutic exercise;Therapeutic activities;Patient/family education;Orthotic Fit/Training;Passive range of motion    PT Next Visit Plan  Continue with strengthening, standing balance.  Gait training & balance training    [place] Code: [email or web address]: [email or web address]: 03/22/2021Prepared by: Vaughan Basta DildayExercisesBridge with Hip Abduction and Resistance - 1 x daily - 7 x weekly - 1 sets - 10 repsMarching Bridge - 1 x daily - 7 x weekly - 1 sets - 10 repsStraight Leg Raise - 1 x daily - 7 x weekly - 1 sets - 10 repsHIP EXTENSION CONTROL EXERCISE - 1 x daily - 7 x weekly - 1 sets - 10 repsSidelying Hip Abduction - 1 x daily - 7 x weekly - 1 sets - 10 repsProne Knee Flexion Extension AROM - 1 x daily - 7 x weekly - 3 sets - 10 repsProne Hip Extension with Bent Knee - 1 x daily - 7 x weekly - 1 sets - 10 reps    Consulted and Agree with Plan of Care  Patient;Family member/caregiver    Family Member Consulted  pt's [name]       Patient will benefit from skilled therapeutic intervention in order to improve the following deficits and impairments:  Abnormal gait, Decreased activity tolerance, Decreased balance,  Decreased endurance, Decreased coordination, Decreased mobility, Decreased range of motion, Difficulty walking, Decreased strength, Impaired flexibility, Impaired sensation, Postural dysfunction, Pain  Visit Diagnosis: Muscle weakness (generalized) - Plan: PT plan of care cert/re-cert  Other abnormalities of gait and mobility - Plan: PT plan of care cert/re-cert  Unsteadiness on feet - Plan: PT plan of care cert/re-cert  Other symptoms and signs involving the nervous system - Plan: PT plan of care cert/re-cert     Problem List Patient Active Problem List   Diagnosis Date Noted  . Pressure injury of skin 11/11/2019  . Anemia   . Constipation   . Neurogenic bladder   .  Neurogenic bowel   . Quadriplegia ([place]) 10/19/2019  . 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 Epidural abscess 10/14/2019    Alda Lea, PT 04/24/2020, 3:05 PM  [facility] [address] [place], [place], [address] Phone: [phone]   Fax:  [phone]  Name: ALBEIRO TROMPETER MRN: [id number] Date of Birth: 04/22/1951

## 2020-04-27 ENCOUNTER — Encounter: Payer: Self-pay | Admitting: Physical Therapy

## 2020-04-27 ENCOUNTER — Other Ambulatory Visit: Payer: Self-pay

## 2020-04-27 ENCOUNTER — Ambulatory Visit: Payer: Medicare Other | Admitting: Physical Therapy

## 2020-04-27 DIAGNOSIS — R29818 Other symptoms and signs involving the nervous system: Secondary | ICD-10-CM | POA: Diagnosis not present

## 2020-04-27 DIAGNOSIS — R2681 Unsteadiness on feet: Secondary | ICD-10-CM

## 2020-04-27 DIAGNOSIS — M6281 Muscle weakness (generalized): Secondary | ICD-10-CM

## 2020-04-27 DIAGNOSIS — R2689 Other abnormalities of gait and mobility: Secondary | ICD-10-CM

## 2020-04-28 NOTE — Therapy (Signed)
[facility] [address] [place], [place], [address] Phone: [phone]   Fax:  [phone]  Physical Therapy Treatment  Patient Details  Name: Ryan Reed MRN: [id number] Date of Birth: 04/20/1951 Referring Provider (PT): [name], MD   Encounter Date: 04/27/2020  PT End of Session - 04/27/20 1852    Visit Number  25    Number of Visits  40    Date for PT Re-Evaluation  06/26/20    Authorization Type  UHC Medicare    Authorization Time Period  04-23-20 - 07-21-20    PT Start Time  1330    PT Stop Time  1415    PT Time Calculation (min)  45 min    Equipment Utilized During Treatment  Other (comment)   pool noodle   Activity Tolerance  Patient tolerated treatment well    Behavior During Therapy  [facility] for tasks assessed/performed       History reviewed. No pertinent past medical history.  Past Surgical History:  Procedure Laterality Date   LACERATION REPAIR Left    left thigh   THORACIC LAMINECTOMY FOR EPIDURAL ABSCESS N/A 10/14/2019   Procedure: Cervical seven - THORACIC seven LAMINECTOMIES FOR EPIDURAL ABSCESS;  Surgeon: [name], MD;  Location: [facility];  Service: Neurosurgery;  Laterality: N/A;    There were no vitals filed for this visit.  Subjective Assessment - 04/27/20 1850    Subjective  States he is still having stiffness in L knee.  Reports having some "nerve pain" as well.    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  No/denies    Pain Onset  More than a month ago       Aquatic therapy at [facility] - pool temp 87.6 degrees   Patient seen for aquatic therapy today.  Treatment took place in water 3.5-4 feet deep depending upon activity.  Pt entered and exited  the pool via ramp negotiation with use of bil. Hand rails with CGA.  Pt performed hamstring  stretch (runner's stretch) for each leg - 1 rep each - 30 sec hold  Pt gait trained approx. 61m across pool  4 reps with mod assist initially with pt holding noodle, stabilized by PTA; progressing to min assist for stabilization of noodle, with pt holding onto noodle for UE support; sideways amb. 2 reps approx. 47m across pool with mod assist on pool noodle stabilized by PT  Squats x 10 reps with bil. UE support on pool wall:  Unilateral squats on each leg - 10 reps RLE, 10 reps LLE with UE support with CGA for closed chain strengthening ;Repeated squats again later in session as above.  Pt performed supine exercises - pool noodle used for flotation  - bicycling LE's with pt flexing Lt knee as much as possible; hip abduction/adduction 2 sets 10 reps   Modified leg press - pt supported by PTA - feet on wall - pushed into hip and knee flexion for closed chain strengthening 10 reps  Pt requires the buoyancy of water for active assisted exercises with buoyancy supported for strengthening & ROM exercises: pt requires the viscosity of the water for resistance with strengthening exercises Gait training without device can be performed in water with minimal fall risk in the water, which is unable for  pt to perform on land with the reduced fall risk Water current provides perturbations which challenge standing balance unsupported    PT Short Term Goals - 04/24/20 1438      PT SHORT TERM GOAL #1   Title  Pt will increase gait speed from 0.41m/s to > .8 m/s for improved household mobility.  ALL STGS DUE 05-23-20    Baseline  .28 m/s on 03/17/20    Time  4    Period  Weeks    Status  New    Target Date  05/22/20      PT SHORT TERM GOAL #2   Title  Pt will amb. 58' with [facility] with CGA for increased household accessibility.    Time  4    Period  Weeks    Status  New    Target Date  05/22/20      PT SHORT TERM GOAL #3   Title  Pt will decr 5x sit <> stand time from mat table at standard height to  20 seconds or less with min guard in order to demo improved BLE strength.    Baseline  22.81 seconds from lower mat table using BUE support from mat and RW, no UE support once in standing    Time  4    Period  Weeks    Status  Revised    Target Date  05/22/20      PT SHORT TERM GOAL #4   Title  Pt will stand for 2" without UE support at counter with SBA for incr. independence with ADL's.    Time  4    Period  Weeks    Status  New    Target Date  05/22/20        PT Long Term Goals - 04/24/20 1425      PT LONG TERM GOAL #1   Title  Pt and pt's wife will be independent with final HEP for LE stretching and strengthening. ALL LTGS DUE 06-26-20: revised LTG - independent in updated HEP to include aquatic exs.    Baseline  --    Time  8    Period  Weeks    Status  Revised    Target Date  06/26/20      PT LONG TERM GOAL #2   Title  Pt will ambulate 100' with LBQC modified independently  with AFO on LLE for increased household accessibility    Baseline  --    Time  8    Period  Weeks    Status  New    Target Date  06/26/20      PT LONG TERM GOAL #3   Title  Pt will decr 5x sit <> stand time from standard mat table to 16 seconds or less in order to demo improved functional BLE strength and improved transfer efficiency.    Baseline  22.81 seconds    Time  12    Period  Weeks    Status  New    Target Date  06/26/20      PT LONG TERM GOAL #4   Title  Pt will increase gait speed from 0.28 m/s to >1.0 m/s with RW for improved household mobility.    Baseline  .28 m/s    Time  8    Period  Weeks    Status  New    Target Date  06/26/20      PT LONG TERM GOAL #5   Title  Pt will negotiate 4 steps with bil. UE support on hand rails modified independently using a step over step sequence.    Baseline  --    Time  12    Period  Weeks    Status  Achieved    Target Date  06/26/20            Plan - 04/27/20 1853    Clinical Impression Statement  Pt continues with L LE  tone/spasticity impacting moblity.  Pt with decreased dorsiflexion on L and catches L LE with gait due to decreased clearance causing LOB.  Pt works hard during session.  Cont PT per POC.    Personal Factors and Comorbidities  Past/Current Experience    Examination-Activity Limitations  Bathing;Stand;Squat;Locomotion Level;Bend;Hygiene/Grooming;Transfers    Examination-Participation Restrictions  Cleaning;Community Activity;Yard Work    Rehab Potential  Good    PT Frequency  2x / week    PT Duration  8 weeks    PT Treatment/Interventions  ADLs/Self Care Home Management;Aquatic Therapy;Electrical Stimulation;DME Instruction;Gait training;Stair training;Functional mobility training;Neuromuscular re-education;Balance training;Therapeutic exercise;Therapeutic activities;Patient/family education;Orthotic Fit/Training;Passive range of motion    PT Next Visit Plan  Continue with strengthening, standing balance.  Gait training & balance training    [place] Code: [email or web address]: [email or web address]: 03/22/2021Prepared by: Vaughan Basta DildayExercisesBridge with Hip Abduction and Resistance - 1 x daily - 7 x weekly - 1 sets - 10 repsMarching Bridge - 1 x daily - 7 x weekly - 1 sets - 10 repsStraight Leg Raise - 1 x daily - 7 x weekly - 1 sets - 10 repsHIP EXTENSION CONTROL EXERCISE - 1 x daily - 7 x weekly - 1 sets - 10 repsSidelying Hip Abduction - 1 x daily - 7 x weekly - 1 sets - 10 repsProne Knee Flexion Extension AROM - 1 x daily - 7 x weekly - 3 sets - 10 repsProne Hip Extension with Bent Knee - 1 x daily - 7 x weekly - 1 sets - 10 reps    Consulted and Agree with Plan of Care  Patient;Family member/caregiver    Family Member Consulted  pt's [name]       Patient will benefit from skilled therapeutic intervention in order to improve the following deficits and impairments:  Abnormal gait, Decreased activity tolerance, Decreased balance, Decreased  endurance, Decreased coordination, Decreased mobility, Decreased range of motion, Difficulty walking, Decreased strength, Impaired flexibility, Impaired sensation, Postural dysfunction, Pain  Visit Diagnosis: Muscle weakness (generalized)  Other abnormalities of gait and mobility  Unsteadiness on feet  Other symptoms and signs involving the nervous system     Problem List Patient Active Problem List   Diagnosis Date Noted   Pressure injury of skin 11/11/2019   Anemia    Constipation    Neurogenic bladder    Neurogenic bowel    Quadriplegia ([place]) 10/19/2019   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

## 2020-04-30 ENCOUNTER — Ambulatory Visit: Payer: Medicare Other | Admitting: Physical Therapy

## 2020-05-02 DIAGNOSIS — G8250 Quadriplegia, unspecified: Secondary | ICD-10-CM | POA: Diagnosis not present

## 2020-05-04 ENCOUNTER — Ambulatory Visit: Payer: Medicare Other | Admitting: Physical Therapy

## 2020-05-07 ENCOUNTER — Ambulatory Visit: Payer: Medicare Other | Admitting: Physical Therapy

## 2020-05-11 ENCOUNTER — Encounter: Payer: Self-pay | Admitting: Physical Therapy

## 2020-05-11 ENCOUNTER — Other Ambulatory Visit: Payer: Self-pay

## 2020-05-11 ENCOUNTER — Ambulatory Visit: Payer: Medicare Other | Admitting: Physical Therapy

## 2020-05-11 DIAGNOSIS — M6281 Muscle weakness (generalized): Secondary | ICD-10-CM | POA: Diagnosis not present

## 2020-05-11 DIAGNOSIS — R2681 Unsteadiness on feet: Secondary | ICD-10-CM | POA: Diagnosis not present

## 2020-05-11 DIAGNOSIS — R2689 Other abnormalities of gait and mobility: Secondary | ICD-10-CM

## 2020-05-11 DIAGNOSIS — R29818 Other symptoms and signs involving the nervous system: Secondary | ICD-10-CM | POA: Diagnosis not present

## 2020-05-11 NOTE — Therapy (Signed)
[facility] [address] [place], [place], [address] Phone: [phone]   Fax:  [phone]  Physical Therapy Treatment  Patient Details  Name: Ryan Reed MRN: [id number] Date of Birth: [birth year] Referring Provider (PT): [name], MD   Encounter Date: 05/11/2020   PT End of Session - 05/11/20 2127    Visit Number 26    Number of Visits 40    Date for PT Re-Evaluation 06/26/20    Authorization Type UHC Medicare    Authorization Time Period 04-23-20 - 07-21-20    PT Start Time 1330    PT Stop Time 1415    PT Time Calculation (min) 45 min    Equipment Utilized During Treatment --   pool noodle, flotation belt   Activity Tolerance Patient tolerated treatment well    Behavior During Therapy [facility] for tasks assessed/performed           History reviewed. No pertinent past medical history.  Past Surgical History:  Procedure Laterality Date  . LACERATION REPAIR Left    left thigh  . THORACIC LAMINECTOMY FOR EPIDURAL ABSCESS N/A 10/14/2019   Procedure: Cervical seven - THORACIC seven LAMINECTOMIES FOR EPIDURAL ABSCESS;  Surgeon: [name], MD;  Location: [facility];  Service: Neurosurgery;  Laterality: N/A;    There were no vitals filed for this visit.   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 No/denies    Pain Onset More than a month ago             Aquatic therapy at [facility] - pool temp 87.6 degrees   Patient seen for aquatic therapy today.  Treatment took place in water 3.5-4 feet deep depending upon activity.  Pt entered and exited  the pool  via ramp negotiation with use of bil. Hand rails with CGA.  Pt performed hamstring stretch (runner's stretch) for each leg - 1 rep each - 30 sec hold  Pt gait trained approx. 42m across pool  4 reps with mod assist initially with pt holding noodle, stabilized by PT; progressing To min assist for stabilization of noodle, with pt holding onto noodle for UE support; sideways amb. 2 reps approx. 54m across pool with mod assist on pool noodle stabilized by PT  Squats x 10 reps with bil. UE support on pool wall:  Unilateral squats on each leg - 10 reps RLE, 10 reps LLE with UE support with CGA for closed chain strengthening   Pt performed bil. Hip flexion, extension and abduction 10 reps each with UE support on side of pool   Pt performed supine exercises - flotation belt used for flotation  - bicycling LE's with pt flexing Lt knee as much as possible; hip abduction/adduction 2 sets 10 reps   Modified leg press - pt supported by PT - feet on wall - pushed into hip and knee flexion for closed chain strengthening 10 reps Bil. Knee to chest with pt given assistance for Lt knee flexion by gently pushing pt's feet into pool wall  to fascilitate Lt knee flexion - 10 reps  Standing balance activity - standing statically without UE support with CGA; head turns horizontally 5 reps and vertically 5 reps Tall kneeling position with pt holding onto rail at ramp - squats x 10 reps; Lt hip extension with knee flexed at 90 degrees - min to mod assist to maintain knee flexion  Pt requires the buoyancy of water for active assisted exercises with buoyancy supported for strengthening & ROM exercises: pt requires the viscosity of the water for resistance with strengthening exercises Gait training without device can be performed in water with minimal fall risk in the water, which is unable for pt to perform on land with the reduced fall risk Water current provides perturbations which challenge standing balance  unsupported                                      PT Short Term Goals - 05/11/20 2137      PT SHORT TERM GOAL #1   Title Pt will increase gait speed from 0.68m/s to > .8 m/s for improved household mobility.  ALL STGS DUE 05-23-20    Baseline .28 m/s on 03/17/20    Time 4    Period Weeks    Status New    Target Date 05/22/20      PT SHORT TERM GOAL #2   Title Pt will amb. 41' with [facility] with CGA for increased household accessibility.    Time 4    Period Weeks    Status New    Target Date 05/22/20      PT SHORT TERM GOAL #3   Title Pt will decr 5x sit <> stand time from mat table at standard height to 20 seconds or less with min guard in order to demo improved BLE strength.    Baseline 22.81 seconds from lower mat table using BUE support from mat and RW, no UE support once in standing    Time 4    Period Weeks    Status Revised    Target Date 05/22/20      PT SHORT TERM GOAL #4   Title Pt will stand for 2" without UE support at counter with SBA for incr. independence with ADL's.    Time 4    Period Weeks    Status New    Target Date 05/22/20             PT Long Term Goals - 05/11/20 2138      PT LONG TERM GOAL #1   Title Pt and pt's wife will be independent with final HEP for LE stretching and strengthening. ALL LTGS DUE 06-26-20: revised LTG - independent in updated HEP to include aquatic exs.    Time 8    Period Weeks    Status Revised      PT LONG TERM GOAL #2   Title Pt will ambulate 100' with LBQC modified independently  with AFO on LLE for increased household accessibility    Time 8    Period Weeks    Status New      PT LONG TERM GOAL #3   Title Pt will decr 5x sit <> stand time from standard mat table to 16 seconds or less in order to demo improved functional BLE strength and improved transfer efficiency.    Baseline 22.81 seconds    Time 12  Period Weeks    Status New      PT LONG TERM GOAL #4   Title Pt will increase  gait speed from 0.28 m/s to >1.0 m/s with RW for improved household mobility.    Baseline .28 m/s    Time 8    Period Weeks    Status New      PT LONG TERM GOAL #5   Title Pt will negotiate 4 steps with bil. UE support on hand rails modified independently using a step over step sequence.    Time 12    Period Weeks    Status Achieved                 Plan - 05/11/20 2129    Clinical Impression Statement Pt did well with aquatic exercises; pt performed Lt hip extension with knee flexed at 90 degrees with min to mod assist to maintain Lt knee in flexed position due to extensor tone/spasticity    Personal Factors and Comorbidities Past/Current Experience    Examination-Activity Limitations Bathing;Stand;Squat;Locomotion Level;Bend;Hygiene/Grooming;Transfers    Examination-Participation Restrictions Cleaning;Community Activity;Yard Work    Rehab Potential Good    PT Frequency 2x / week    PT Duration 8 weeks    PT Treatment/Interventions ADLs/Self Care Home Management;Aquatic Therapy;Electrical Stimulation;DME Instruction;Gait training;Stair training;Functional mobility training;Neuromuscular re-education;Balance training;Therapeutic exercise;Therapeutic activities;Patient/family education;Orthotic Fit/Training;Passive range of motion    PT Next Visit Plan Continue with strengthening, standing balance.  Gait training & balance training    [place] Code: [email or web address]: [email or web address]: 03/22/2021Prepared by: Vaughan Basta DildayExercisesBridge with Hip Abduction and Resistance - 1 x daily - 7 x weekly - 1 sets - 10 repsMarching Bridge - 1 x daily - 7 x weekly - 1 sets - 10 repsStraight Leg Raise - 1 x daily - 7 x weekly - 1 sets - 10 repsHIP EXTENSION CONTROL EXERCISE - 1 x daily - 7 x weekly - 1 sets - 10 repsSidelying Hip Abduction - 1 x daily - 7 x weekly - 1 sets - 10 repsProne Knee Flexion Extension AROM - 1 x daily - 7 x weekly - 3  sets - 10 repsProne Hip Extension with Bent Knee - 1 x daily - 7 x weekly - 1 sets - 10 reps    Consulted and Agree with Plan of Care Patient;Family member/caregiver    Family Member Consulted pt's wife [name]           Patient will benefit from skilled therapeutic intervention in order to improve the following deficits and impairments:  Abnormal gait, Decreased activity tolerance, Decreased balance, Decreased endurance, Decreased coordination, Decreased mobility, Decreased range of motion, Difficulty walking, Decreased strength, Impaired flexibility, Impaired sensation, Postural dysfunction, Pain  Visit Diagnosis: Muscle weakness (generalized)  Other abnormalities of gait and mobility  Unsteadiness on feet     Problem List Patient Active Problem List   Diagnosis Date Noted  . Pressure injury of skin 11/11/2019  . Anemia   . Constipation   . Neurogenic bladder   . Neurogenic bowel   . Quadriplegia ([place]) 10/19/2019  . 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 Epidural abscess 10/14/2019    Cheyanne Lamison, [place], PT, ATRIC 05/11/2020, 9:42 PM  [facility] [address] [place], [place], [address] Phone: [phone]   Fax:  [phone]  Name: ALEXANDERJAMES BERG MRN: [id number] Date of Birth: 11-29-1950

## 2020-05-14 ENCOUNTER — Ambulatory Visit: Payer: Medicare Other

## 2020-05-18 ENCOUNTER — Other Ambulatory Visit: Payer: Self-pay

## 2020-05-18 ENCOUNTER — Ambulatory Visit: Payer: Medicare Other | Admitting: Physical Therapy

## 2020-05-18 ENCOUNTER — Encounter: Payer: Self-pay | Admitting: Physical Therapy

## 2020-05-18 DIAGNOSIS — R2689 Other abnormalities of gait and mobility: Secondary | ICD-10-CM

## 2020-05-18 DIAGNOSIS — M6281 Muscle weakness (generalized): Secondary | ICD-10-CM | POA: Diagnosis not present

## 2020-05-18 DIAGNOSIS — R2681 Unsteadiness on feet: Secondary | ICD-10-CM | POA: Diagnosis not present

## 2020-05-18 DIAGNOSIS — R29818 Other symptoms and signs involving the nervous system: Secondary | ICD-10-CM | POA: Diagnosis not present

## 2020-05-18 NOTE — Therapy (Signed)
[name] [address] [place], [place], [address] Phone: [phone]   Fax:  [phone]  Physical Therapy Treatment  Patient Details  Name: Ryan Reed MRN: [id number] Date of Birth: Mar 18, 1951 Referring Provider (PT): [name], MD   Encounter Date: 05/18/2020   PT End of Session - 05/18/20 2040    Visit Number 27    Number of Visits 40    Date for PT Re-Evaluation 06/26/20    Authorization Type UHC Medicare    Authorization Time Period 04-23-20 - 07-21-20    PT Start Time 1330    PT Stop Time 1415    PT Time Calculation (min) 45 min    Equipment Utilized During Treatment Other (comment)   pool noodle   Activity Tolerance Patient tolerated treatment well    Behavior During Therapy [facility] for tasks assessed/performed           History reviewed. No pertinent past medical history.  Past Surgical History:  Procedure Laterality Date  . LACERATION REPAIR Left    left thigh  . THORACIC LAMINECTOMY FOR EPIDURAL ABSCESS N/A 10/14/2019   Procedure: Cervical seven - THORACIC seven LAMINECTOMIES FOR EPIDURAL ABSCESS;  Surgeon: [name], MD;  Location: [facility];  Service: Neurosurgery;  Laterality: N/A;    There were no vitals filed for this visit.   Subjective Assessment - 05/18/20 2038    Subjective Pt presents for aquatic therapy at [facility]; states he has appt with MD on 05-27-20 to determine if some mild surgery will be needed.  Pt states he did alot of walking this morning at home.    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 No/denies    Pain Onset More than a month ago                 Aquatic therapy at [facility] - pool temp 87.4 degrees   Patient seen for aquatic therapy today.  Treatment took place in water 4 feet deep depending upon activity.  Pt entered and exited    the pool via ramp negotiation with use of bil. Hand rails with SBA.  Pt performed hamstring stretch (runner's stretch) for each leg - 1 rep each - 30 sec hold  Pt gait trained approx. 11m across pool 2 reps with mod assist initially with pt holding noodle, stabilized by PT; progressing To min assist for stabilization of noodle, with pt holding onto noodle for UE support; sideways amb. 2 reps approx. 43m across pool with mod assist on pool noodle stabilized by PT  Squats x 10 reps with bil. UE support on pool wall:  Unilateral squats on each leg - 10 reps RLE, 10 reps LLE with UE support with CGA for closed chain strengthening  Marching (as able in flexing Lt hip and knee) 83m across pool with UE support on pool noodle  Seated on bench - LAQ's 10 reps each leg - assistance for flexing Lt knee full ROM   Pt performed supine exercises - flotation belt used for flotation  - bicycling LE's with pt flexing Lt knee as much as possible; hip abduction/adduction 2 sets 10 reps   Modified leg press - pt supported by PT - feet on wall - pushed into hip and knee flexion for closed chain strengthening 10 reps Bil. Knee to  chest with pt given assistance for Lt knee flexion by gently pushing pt's feet into pool wall to fascilitate Lt knee flexion - 10 reps  Standing balance activity - standing statically without UE support with CGA; Tall kneeling position with pt holding onto rail at ramp; Lt hip extension with knee flexed at 90 degrees - min assist to maintain knee flexion  Pt requires the buoyancy of water for active assisted exercises with buoyancy supported for strengthening & ROM exercises: pt requires the viscosity of the water for resistance with strengthening exercises Gait training without device can be performed in water with minimal fall risk in the water, which is unable for pt to perform on land with the reduced fall risk Water current provides perturbations which challenge standing balance  unsupported                                 PT Short Term Goals - 05/18/20 2046      PT SHORT TERM GOAL #1   Title Pt will increase gait speed from 0.18m/s to > .8 m/s for improved household mobility.  ALL STGS DUE 05-23-20    Baseline .28 m/s on 03/17/20    Time 4    Period Weeks    Status New    Target Date 05/22/20      PT SHORT TERM GOAL #2   Title Pt will amb. 38' with [facility] with CGA for increased household accessibility.    Time 4    Period Weeks    Status New    Target Date 05/22/20      PT SHORT TERM GOAL #3   Title Pt will decr 5x sit <> stand time from mat table at standard height to 20 seconds or less with min guard in order to demo improved BLE strength.    Baseline 22.81 seconds from lower mat table using BUE support from mat and RW, no UE support once in standing    Time 4    Period Weeks    Status Revised    Target Date 05/22/20      PT SHORT TERM GOAL #4   Title Pt will stand for 2" without UE support at counter with SBA for incr. independence with ADL's.    Time 4    Period Weeks    Status New    Target Date 05/22/20             PT Long Term Goals - 05/18/20 2047      PT LONG TERM GOAL #1   Title Pt and pt's wife will be independent with final HEP for LE stretching and strengthening. ALL LTGS DUE 06-26-20: revised LTG - independent in updated HEP to include aquatic exs.    Time 8    Period Weeks    Status Revised      PT LONG TERM GOAL #2   Title Pt will ambulate 100' with LBQC modified independently  with AFO on LLE for increased household accessibility    Time 8    Period Weeks    Status New      PT LONG TERM GOAL #3   Title Pt will decr 5x sit <> stand time from standard mat table to 16 seconds or less in order to demo improved functional BLE strength and improved transfer efficiency.    Baseline 22.81 seconds    Time 12    Period Weeks  Status New      PT LONG TERM GOAL #4   Title Pt will increase gait speed  from 0.28 m/s to >1.0 m/s with RW for improved household mobility.    Baseline .28 m/s    Time 8    Period Weeks    Status New      PT LONG TERM GOAL #5   Title Pt will negotiate 4 steps with bil. UE support on hand rails modified independently using a step over step sequence.    Time 12    Period Weeks    Status Achieved                 Plan - 05/18/20 2042    Clinical Impression Statement Pt demonstrating increased Lt knee flexion with slightly less spasticity noted in LLE; tone did increase with fatigue.  Pt continues to require UE support on pool noodle for assistance with balance; pt able to stand unsupported in 4' water depth with sculling of arms for assistance with balance.    Personal Factors and Comorbidities Past/Current Experience    Examination-Activity Limitations Bathing;Stand;Squat;Locomotion Level;Bend;Hygiene/Grooming;Transfers    Examination-Participation Restrictions Cleaning;Community Activity;Yard Work    Rehab Potential Good    PT Frequency 2x / week    PT Duration 8 weeks    PT Treatment/Interventions ADLs/Self Care Home Management;Aquatic Therapy;Electrical Stimulation;DME Instruction;Gait training;Stair training;Functional mobility training;Neuromuscular re-education;Balance training;Therapeutic exercise;Therapeutic activities;Patient/family education;Orthotic Fit/Training;Passive range of motion    PT Next Visit Plan Please check STG's as due 05-23-20- - -cont gait training & balance training    [place] Code: [email or web address]: [email or web address]: 03/22/2021Prepared by: Vaughan Basta DildayExercisesBridge with Hip Abduction and Resistance - 1 x daily - 7 x weekly - 1 sets - 10 repsMarching Bridge - 1 x daily - 7 x weekly - 1 sets - 10 repsStraight Leg Raise - 1 x daily - 7 x weekly - 1 sets - 10 repsHIP EXTENSION CONTROL EXERCISE - 1 x daily - 7 x weekly - 1 sets - 10 repsSidelying Hip Abduction - 1 x daily - 7 x  weekly - 1 sets - 10 repsProne Knee Flexion Extension AROM - 1 x daily - 7 x weekly - 3 sets - 10 repsProne Hip Extension with Bent Knee - 1 x daily - 7 x weekly - 1 sets - 10 reps    Consulted and Agree with Plan of Care Patient;Family member/caregiver    Family Member Consulted pt's wife [name]           Patient will benefit from skilled therapeutic intervention in order to improve the following deficits and impairments:  Abnormal gait, Decreased activity tolerance, Decreased balance, Decreased endurance, Decreased coordination, Decreased mobility, Decreased range of motion, Difficulty walking, Decreased strength, Impaired flexibility, Impaired sensation, Postural dysfunction, Pain  Visit Diagnosis: Other abnormalities of gait and mobility  Muscle weakness (generalized)  Unsteadiness on feet     Problem List Patient Active Problem List   Diagnosis Date Noted  . Pressure injury of skin 11/11/2019  . Anemia   . Constipation   . Neurogenic bladder   . Neurogenic bowel   . Quadriplegia ([place]) 10/19/2019  . 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 Epidural abscess 10/14/2019    Alda Lea, PT 05/18/2020, 8:49 PM  [facility] [address] [place], [place], [address] Phone: [phone]   Fax:  [phone]  Name: DALLIN MCCORKEL MRN: [id number] Date of  Birth: 1951-02-17

## 2020-05-21 ENCOUNTER — Ambulatory Visit: Payer: Medicare Other | Admitting: Physical Therapy

## 2020-05-21 DIAGNOSIS — G8250 Quadriplegia, unspecified: Secondary | ICD-10-CM | POA: Diagnosis not present

## 2020-05-21 DIAGNOSIS — N319 Neuromuscular dysfunction of bladder, unspecified: Secondary | ICD-10-CM | POA: Diagnosis not present

## 2020-05-21 DIAGNOSIS — R339 Retention of urine, unspecified: Secondary | ICD-10-CM | POA: Diagnosis not present

## 2020-05-28 ENCOUNTER — Other Ambulatory Visit: Payer: Self-pay

## 2020-05-28 ENCOUNTER — Ambulatory Visit: Payer: Medicare Other | Attending: Physical Medicine and Rehabilitation

## 2020-05-28 DIAGNOSIS — R2689 Other abnormalities of gait and mobility: Secondary | ICD-10-CM | POA: Insufficient documentation

## 2020-05-28 DIAGNOSIS — M6281 Muscle weakness (generalized): Secondary | ICD-10-CM | POA: Insufficient documentation

## 2020-05-28 DIAGNOSIS — R2681 Unsteadiness on feet: Secondary | ICD-10-CM | POA: Diagnosis not present

## 2020-05-28 NOTE — Therapy (Signed)
[facility] [address] [place], [place], [address] Phone: [phone]   Fax:  [phone]  Physical Therapy Treatment  Patient Details  Name: Ryan Reed MRN: [id number] Date of Birth: 1951-03-14 Referring Provider (PT): [name], MD   Encounter Date: 05/28/2020   PT End of Session - 05/28/20 0935    Visit Number 28    Number of Visits 40    Date for PT Re-Evaluation 06/26/20    Authorization Type U[employer] Medicare    Authorization Time Period 04-23-20 - 07-21-20    PT Start Time 0933    PT Stop Time 1017    PT Time Calculation (min) 44 min    Equipment Utilized During Treatment Other (comment)   pool noodle   Activity Tolerance Patient tolerated treatment well    Behavior During Therapy [facility] for tasks assessed/performed           History reviewed. No pertinent past medical history.  Past Surgical History:  Procedure Laterality Date  . LACERATION REPAIR Left    left thigh  . THORACIC LAMINECTOMY FOR EPIDURAL ABSCESS N/A 10/14/2019   Procedure: Cervical seven - THORACIC seven LAMINECTOMIES FOR EPIDURAL ABSCESS;  Surgeon: [name], MD;  Location: [facility];  Service: Neurosurgery;  Laterality: N/A;    There were no vitals filed for this visit.   Subjective Assessment - 05/28/20 0935    Subjective Pt reports that he will be moving to [place] the end of the month. Reports that his leg is a little tighter today.    Patient is accompained by: Family member   wife [name]   Pertinent History hard of hearing, SCI (09/2019)    Patient Stated Goals wants to walk again,    Pain Onset More than a month ago                             [facility] Adult PT Treatment/Exercise - 05/28/20 0936      Transfers   Transfers Sit to Stand;Stand to Sit    Sit to Stand 5: Supervision    Sit to Stand Details Verbal cues for technique    Sit to Stand Details (indicate cue type and reason) Pt was cued to no  brace knees on mat when rising.    Five time sit to stand comments  22 sec with hands but locking legs so cued not to push in to mat. Repeated as pt slowed down with instruction to not lock legs and performed in 16 sec    Stand to Sit 5: Supervision      Ambulation/Gait   Ambulation/Gait Yes    Ambulation/Gait Assistance 5: Supervision    Ambulation/Gait Assistance Details Pt was cued to try to increase left hip flexion. Pt catching left toes on floor at times but able to correct on own supervision most of time.    Ambulation Distance (Feet) 345 Feet    Assistive device Rolling walker   left AFO   Gait Pattern Step-through pattern;Decreased hip/knee flexion - left;Left hip hike    Ambulation Surface Level;Indoor    Gait velocity 22.08 sec=0.38ms or 1.466f sec      Neuro Re-ed    Neuro Re-ed Details  Pt stood at walker without UE support 2 min 30 sec. Along counter: walking forwards and back 6' x 8 with RUE support close SBA/CGA. Tapping cone with RLE 10 x 2 with RUE support only on  counter. Left foot on bottom step with flexion forward x 10. Attempted standing left hip flexion but unable to clear foot when PT blocked left hip hike.      Exercises   Exercises Other Exercises    Other Exercises  Supine left heel slide x 5, bridges x 10 then on red physioball x 10 with PT lightly stabilizing at ball.  Left hip flexion x 10 on red physioball min assist in to flexion.                    PT Short Term Goals - 05/28/20 1921      PT SHORT TERM GOAL #1   Title Pt will increase gait speed from 0.24m/s to > .8 m/s for improved household mobility.  ALL STGS DUE 05-23-20    Baseline .28 m/s on 03/17/20, 0.45m/s on 05/28/20    Time 4    Period Weeks    Status Not Met    Target Date 05/22/20      PT SHORT TERM GOAL #2   Title Pt will amb. 50' with LBQC with CGA for increased household accessibility.    Baseline 345' supervision with RW, unable to walk with quad cane yet    Time 4     Period Weeks    Status Not Met    Target Date 05/22/20      PT SHORT TERM GOAL #3   Title Pt will decr 5x sit <> stand time from mat table at standard height to 20 seconds or less with min guard in order to demo improved BLE strength.    Baseline 16 sec from mat with light UE support to rise on 05/28/20    Time 4    Period Weeks    Status Achieved    Target Date 05/22/20      PT SHORT TERM GOAL #4   Title Pt will stand for 2" without UE support at counter with SBA for incr. independence with ADL's.    Baseline Pt stood >2 min without UE support supervision on 05/28/20    Time 4    Period Weeks    Status Achieved    Target Date 05/22/20             PT Long Term Goals - 05/18/20 2047      PT LONG TERM GOAL #1   Title Pt and pt's wife will be independent with final HEP for LE stretching and strengthening. ALL LTGS DUE 06-26-20: revised LTG - independent in updated HEP to include aquatic exs.    Time 8    Period Weeks    Status Revised      PT LONG TERM GOAL #2   Title Pt will ambulate 100' with LBQC modified independently  with AFO on LLE for increased household accessibility    Time 8    Period Weeks    Status New      PT LONG TERM GOAL #3   Title Pt will decr 5x sit <> stand time from standard mat table to 16 seconds or less in order to demo improved functional BLE strength and improved transfer efficiency.    Baseline 22.81 seconds    Time 12    Period Weeks    Status New      PT LONG TERM GOAL #4   Title Pt will increase gait speed from 0.28 m/s to >1.0 m/s with RW for improved household mobility.    Baseline .28 m/s      Time 8    Period Weeks    Status New      PT LONG TERM GOAL #5   Title Pt will negotiate 4 steps with bil. UE support on hand rails modified independently using a step over step sequence.    Time 12    Period Weeks    Status Achieved                 Plan - 05/28/20 1924    Clinical Impression Statement PT assessed STGs today. Pt met  standing balance goal and 5 x sit to stand showing improving balance and functional strength. He was able to increase gait distance with RW but is unable to progress on to quad cane yet. Pt was having more tone in LLE today with limited left hip/knee flexion with gait having to hip hike to clear foot. Pt is able to activate more hip/knee flexion in supine but tone still strong. Pt continues to benefit from skilled PT to continue to address strength, balance and functional mobility deficits.    Personal Factors and Comorbidities Past/Current Experience    Examination-Activity Limitations Bathing;Stand;Squat;Locomotion Level;Bend;Hygiene/Grooming;Transfers    Examination-Participation Restrictions Cleaning;Community Activity;Yard Work    Rehab Potential Good    PT Frequency 2x / week    PT Duration 8 weeks    PT Treatment/Interventions ADLs/Self Care Home Management;Aquatic Therapy;Electrical Stimulation;DME Instruction;Gait training;Stair training;Functional mobility training;Neuromuscular re-education;Balance training;Therapeutic exercise;Therapeutic activities;Patient/family education;Orthotic Fit/Training;Passive range of motion    PT Next Visit Plan Aquatic PT next session. cont gait training, balance training, and strengthening. Perhaps try tall kneeling next land visit?    PT Home Exercise Plan Medbridge [email or web address]:Access Code: [email or web address]: [email or web address]: 03/22/2021Prepared by: Linda DildayExercisesBridge with Hip Abduction and Resistance - 1 x daily - 7 x weekly - 1 sets - 10 repsMarching Bridge - 1 x daily - 7 x weekly - 1 sets - 10 repsStraight Leg Raise - 1 x daily - 7 x weekly - 1 sets - 10 repsHIP EXTENSION CONTROL EXERCISE - 1 x daily - 7 x weekly - 1 sets - 10 repsSidelying Hip Abduction - 1 x daily - 7 x weekly - 1 sets - 10 repsProne Knee Flexion Extension AROM - 1 x daily - 7 x weekly - 3 sets - 10 repsProne Hip Extension with Bent Knee - 1 x daily - 7 x weekly - 1  sets - 10 reps    Consulted and Agree with Plan of Care Patient           Patient will benefit from skilled therapeutic intervention in order to improve the following deficits and impairments:  Abnormal gait, Decreased activity tolerance, Decreased balance, Decreased endurance, Decreased coordination, Decreased mobility, Decreased range of motion, Difficulty walking, Decreased strength, Impaired flexibility, Impaired sensation, Postural dysfunction, Pain  Visit Diagnosis: Other abnormalities of gait and mobility  Muscle weakness (generalized)     Problem List Patient Active Problem List   Diagnosis Date Noted  . Pressure injury of skin 11/11/2019  . Anemia   . Constipation   . Neurogenic bladder   . Neurogenic bowel   . Quadriplegia (HCC) 10/19/2019  . 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 Epidural abscess 10/14/2019    Emily A Parker, PT, DPT, NCS 05/28/2020, 7:28 PM  [facility] [address] [place], [place], [address] Phone: [phone]   Fax:  [phone]  Name: Rishik L Wixted MRN: [id number] Date of Birth: 04/15/1951   

## 2020-06-01 ENCOUNTER — Other Ambulatory Visit: Payer: Self-pay

## 2020-06-01 ENCOUNTER — Ambulatory Visit: Payer: Medicare Other | Admitting: Physical Therapy

## 2020-06-01 DIAGNOSIS — G8250 Quadriplegia, unspecified: Secondary | ICD-10-CM | POA: Diagnosis not present

## 2020-06-01 DIAGNOSIS — R2689 Other abnormalities of gait and mobility: Secondary | ICD-10-CM | POA: Diagnosis not present

## 2020-06-01 DIAGNOSIS — M6281 Muscle weakness (generalized): Secondary | ICD-10-CM

## 2020-06-01 DIAGNOSIS — R2681 Unsteadiness on feet: Secondary | ICD-10-CM

## 2020-06-02 ENCOUNTER — Encounter: Payer: Self-pay | Admitting: Physical Therapy

## 2020-06-02 NOTE — Therapy (Signed)
[facility] [address] [place], [place], [address] Phone: [phone]   Fax:  [phone]  Physical Therapy Treatment  Patient Details  Name: Ryan Reed MRN: [id number] Date of Birth: 09/14/1951 Referring Provider (PT): [name], MD   Encounter Date: 06/01/2020   PT End of Session - 06/02/20 0812    Visit Number 29    Number of Visits 40    Date for PT Re-Evaluation 06/26/20    Authorization Type UHC Medicare    Authorization Time Period 04-23-20 - 07-21-20    PT Start Time 1330    PT Stop Time 1415    PT Time Calculation (min) 45 min    Equipment Utilized During Treatment Other (comment)   pool noodle   Activity Tolerance Patient tolerated treatment well    Behavior During Therapy [facility] for tasks assessed/performed           History reviewed. No pertinent past medical history.  Past Surgical History:  Procedure Laterality Date  . LACERATION REPAIR Left    left thigh  . THORACIC LAMINECTOMY FOR EPIDURAL ABSCESS N/A 10/14/2019   Procedure: Cervical seven - THORACIC seven LAMINECTOMIES FOR EPIDURAL ABSCESS;  Surgeon: [name], MD;  Location: [facility];  Service: Neurosurgery;  Laterality: N/A;    There were no vitals filed for this visit.   Subjective Assessment - 06/02/20 0810    Subjective Pt reports he is having outpatient back surgery on 7/20.  Denies any other changes or falls.    Patient is accompained by: Family member   [name]   Pertinent History hard of hearing, SCI (09/2019)    Patient Stated Goals wants to walk again,    Currently in Pain? No/denies    Pain Onset More than a month ago           Aquatic therapy at [facility] - pool temp 87.4 degrees   Patient seen for aquatic therapy today.  Treatment took place in water 4 feet deep depending upon activity.  Pt entered and exited  the pool via ramp negotiation with use of bil. Hand rails with SBA.  Pt performed hamstring stretch  (runner's stretch) for each leg - 1 rep each - 30 sec hold  Pt gait trained approx. 53macross pool 2 reps with mod assist initially with pt holding noodle, stabilized by PTA; progressing to min assist for stabilization of noodle, with pt holding onto noodle for UE support; sideways amb. 2 reps approx. 234mcross pool with mod assist on pool noodle stabilized by PT; backwards amb 2573m2 with same assist and pool noodle. Squats x 10 reps with bil. UE support on pool wall:  Unilateral squats on each leg - 10 reps RLE, 10 reps LLE with UE support with CGA for closed chain strengthening   Marching (as able in flexing Lt hip and knee) with neck noodle under/around L anke to assist with flexion x 10 reps x 2 sets and UE support of pool edge.   Pt performed supine exercises - pool noodle used for flotation under bil arms - bicycling LE's with pt flexing Lt knee as much as possible for 39m59m; hip abduction/adduction 10m 39mwith PTA propelling pt backwards.   Modified leg press - pt supported by PT - feet on wall - pushed into hip and knee flexion for closed chain strengthening 10 reps Bil. Knee to chest with pt given assistance for Lt knee flexion by  gently pushing pt's feet into pool wall to fascilitate Lt knee flexion - 10 reps  Standing balance activity - standing statically without UE support with CGA;  Pt requires the buoyancy of water for active assisted exercises with buoyancy supported for strengthening & ROM exercises: pt requires the viscosity of the water for resistance with strengthening exercises Gait training without device can be performed in water with minimal fall risk in the water, which is unable for pt to perform on land with the reduced fall risk Water current provides perturbations which challenge standing balance unsupported     PT Short Term Goals - 05/28/20 1921      PT SHORT TERM GOAL #1   Title Pt will increase gait speed from 0.100ms to > .8 m/s for improved  household mobility.  ALL STGS DUE 05-23-20    Baseline .28 m/s on 03/17/20, 0.47m on 05/28/20    Time 4    Period Weeks    Status Not Met    Target Date 05/22/20      PT SHORT TERM GOAL #2   Title Pt will amb. 5036with LBO[facility] LLCith CGA for increased household accessibility.    Baseline 345' supervision with RW, unable to walk with quad cane yet    Time 4    Period Weeks    Status Not Met    Target Date 05/22/20      PT SHORT TERM GOAL #3   Title Pt will decr 5x sit <> stand time from mat table at standard height to 20 seconds or less with min guard in order to demo improved BLE strength.    Baseline 16 sec from mat with light UE support to rise on 05/28/20    Time 4    Period Weeks    Status Achieved    Target Date 05/22/20      PT SHORT TERM GOAL #4   Title Pt will stand for 2" without UE support at counter with SBA for incr. independence with ADL's.    Baseline Pt stood >2 min without UE support supervision on 05/28/20    Time 4    Period Weeks    Status Achieved    Target Date 05/22/20             PT Long Term Goals - 05/18/20 2047      PT LONG TERM GOAL #1   Title Pt and pt's wife will be independent with final HEP for LE stretching and strengthening. ALL LTGS DUE 06-26-20: revised LTG - independent in updated HEP to include aquatic exs.    Time 8    Period Weeks    Status Revised      PT LONG TERM GOAL #2   Title Pt will ambulate 100' with LBQC modified independently  with AFO on LLE for increased household accessibility    Time 8    Period Weeks    Status New      PT LONG TERM GOAL #3   Title Pt will decr 5x sit <> stand time from standard mat table to 16 seconds or less in order to demo improved functional BLE strength and improved transfer efficiency.    Baseline 22.81 seconds    Time 12    Period Weeks    Status New      PT LONG TERM GOAL #4   Title Pt will increase gait speed from 0.28 m/s to >1.0 m/s with RW for improved household mobility.    Baseline .  28  m/s    Time 8    Period Weeks    Status New      PT LONG TERM GOAL #5   Title Pt will negotiate 4 steps with bil. UE support on hand rails modified independently using a step over step sequence.    Time 12    Period Weeks    Status Achieved                 Plan - 06/02/20 7322    Clinical Impression Statement Pt continues to progress toward goals with improved balance and decreased assist  with gait in pool.  Pt continues to have stiffness in L knee that varies at time through out session.  Pt continues to work hard during session with few rest breaks needed.  Cont per poc.    Personal Factors and Comorbidities Past/Current Experience    Examination-Activity Limitations Bathing;Stand;Squat;Locomotion Level;Bend;Hygiene/Grooming;Transfers    Examination-Participation Restrictions Cleaning;Community Activity;Yard Work    Rehab Potential Good    PT Frequency 2x / week    PT Duration 8 weeks    PT Treatment/Interventions ADLs/Self Care Home Management;Aquatic Therapy;Electrical Stimulation;DME Instruction;Gait training;Stair training;Functional mobility training;Neuromuscular re-education;Balance training;Therapeutic exercise;Therapeutic activities;Patient/family education;Orthotic Fit/Training;Passive range of motion    PT Next Visit Plan Aquatic PT next session. cont gait training, balance training, and strengthening. Perhaps try tall kneeling next land visit?    [place] [email or web address] Code: [email or web address]: [email or web address]: 03/22/2021Prepared by: Vaughan Basta DildayExercisesBridge with Hip Abduction and Resistance - 1 x daily - 7 x weekly - 1 sets - 10 repsMarching Bridge - 1 x daily - 7 x weekly - 1 sets - 10 repsStraight Leg Raise - 1 x daily - 7 x weekly - 1 sets - 10 repsHIP EXTENSION CONTROL EXERCISE - 1 x daily - 7 x weekly - 1 sets - 10 repsSidelying Hip Abduction - 1 x daily - 7 x weekly - 1 sets - 10 repsProne Knee Flexion Extension AROM - 1  x daily - 7 x weekly - 3 sets - 10 repsProne Hip Extension with Bent Knee - 1 x daily - 7 x weekly - 1 sets - 10 reps    Consulted and Agree with Plan of Care Patient           Patient will benefit from skilled therapeutic intervention in order to improve the following deficits and impairments:  Abnormal gait, Decreased activity tolerance, Decreased balance, Decreased endurance, Decreased coordination, Decreased mobility, Decreased range of motion, Difficulty walking, Decreased strength, Impaired flexibility, Impaired sensation, Postural dysfunction, Pain  Visit Diagnosis: Other abnormalities of gait and mobility  Muscle weakness (generalized)  Unsteadiness on feet     Problem List Patient Active Problem List   Diagnosis Date Noted  . Pressure injury of skin 11/11/2019  . Anemia   . Constipation   . Neurogenic bladder   . Neurogenic bowel   . Quadriplegia ([place]) 10/19/2019  . 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 Epidural abscess 10/14/2019    Narda Bonds, [place] 06/02/20 8:19 AM Phone: [phone] Fax: [facility] [address] [place], [place], [address] Phone: [phone]   Fax:  [phone]  Name: JYAIRE KOUDELKA MRN: [id number] Date of Birth: 01/25/1951

## 2020-06-04 ENCOUNTER — Ambulatory Visit: Payer: Medicare Other

## 2020-06-04 ENCOUNTER — Other Ambulatory Visit: Payer: Self-pay

## 2020-06-04 DIAGNOSIS — R2689 Other abnormalities of gait and mobility: Secondary | ICD-10-CM | POA: Diagnosis not present

## 2020-06-04 DIAGNOSIS — M6281 Muscle weakness (generalized): Secondary | ICD-10-CM | POA: Diagnosis not present

## 2020-06-04 DIAGNOSIS — R2681 Unsteadiness on feet: Secondary | ICD-10-CM | POA: Diagnosis not present

## 2020-06-05 NOTE — Therapy (Signed)
[facility] [address] [place], [place], [address] Phone: [phone]   Fax:  [phone]  Physical Therapy Treatment/10th visit progress note  Patient Details  Name: Ryan Reed MRN: [id number] Date of Birth: 01/15/1951 Referring Provider (PT): [name], MD    Progress Note  Reporting period 04/06/20 to 06/04/20  See Note below for Objective Data and Assessment of Progress/Goals  Encounter Date: 06/04/2020   PT End of Session - 06/04/20 1107    Visit Number 30    Number of Visits 40    Date for PT Re-Evaluation 06/26/20    Authorization Type [employer] Medicare    Authorization Time Period 04-23-20 - 07-21-20    PT Start Time 1102    PT Stop Time 1144    PT Time Calculation (min) 42 min    Equipment Utilized During Treatment Other (comment)   pool noodle   Activity Tolerance Patient tolerated treatment well    Behavior During Therapy [facility] for tasks assessed/performed           History reviewed. No pertinent past medical history.  Past Surgical History:  Procedure Laterality Date  . LACERATION REPAIR Left    left thigh  . THORACIC LAMINECTOMY FOR EPIDURAL ABSCESS N/A 10/14/2019   Procedure: Cervical seven - THORACIC seven LAMINECTOMIES FOR EPIDURAL ABSCESS;  Surgeon: [name], MD;  Location: [place];  Service: Neurosurgery;  Laterality: N/A;    There were no vitals filed for this visit.   Subjective Assessment - 06/04/20 1106    Subjective Pt reports that he is having mole removed off clavicle and cyst removed off back. Will have some stitches on back. Wants to cancel next Thursday appointment due to that.    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 No/denies    Pain Onset More than a month ago                             [facility] PT  Treatment/Exercise - 06/04/20 1109      Transfers   Transfers Sit to Stand;Stand to Sit    Sit to Stand 5: Supervision    Sit to Stand Details Verbal cues for technique    Sit to Stand Details (indicate cue type and reason) Verbal cues to not brace knees on mat    Five time sit to stand comments  16 sec with hands from low mat    Stand to Sit 5: Supervision      Ambulation/Gait   Ambulation/Gait Yes    Ambulation/Gait Assistance 5: Supervision    Ambulation/Gait Assistance Details around in [place] during activities and in/out of clinic. Pt cued to try to increase left foot clearance. Still having more extensor tone in standing limiting left hip/knee flexion with having to utilize hip hike to clear leg.    Assistive device Rolling walker   left AFO   Gait Pattern Step-through pattern;Decreased hip/knee flexion - left;Poor foot clearance - left    Ambulation Surface Level;Indoor    Gait Comments Pt ambulated in // bars with RUE support only 6' x 6 close SBA/CGA. Pt using hip hike to clear LLE.      Neuro Re-ed    Neuro Re-ed Details  In // bars: side stepping with  light UE support 6' x 6, left step out to side maintaining position with PT providing isometric resistance at left hip with weight shift over x 20. Lateral step-ups with LLE with keeping leg on step and performing pelvic drop to lower right foot to floor and then coming back up trying to work more on glut med. Step down forward off 4" step with LLE lowering weight x 10 with tactile cues at knee to straighten to come back up on to step. BUE support for both activities. Standing on rockerboard maintaining level x 30 sec then with alternating shoulder flexion x 10 with verbal cues to tighten core to help with posture. Board was positioned ant/post. CGA for safety.      Exercises   Exercises Other Exercises      Lumbar Exercises: Aerobic   Elliptical Pt trialed elliptical a couple minutes max assist +2 to get up and down and then min  assist to +2 with performance backward initially then able to go forward. Pt was actually able to get left hip/knee flexion but was very fatiguing.                    PT Short Term Goals - 06/05/20 0749      PT SHORT TERM GOAL #1   Title Pt will increase gait speed from 0.44ms to > .8 m/s for improved household mobility.  ALL STGS DUE 05-23-20    Baseline .28 m/s on 03/17/20, 0.451m on 05/28/20    Time 4    Period Weeks    Status Not Met    Target Date 05/22/20      PT SHORT TERM GOAL #2   Title Pt [facility]. [facility] for increased household accessibility.    Baseline 345' supervision with RW, unable to walk with quad cane yet    Time 4    Period Weeks    Status Not Met    Target Date 05/22/20      PT SHORT TERM GOAL #3   Title Pt will decr 5x sit <> stand time from mat table at standard height to 20 seconds or less with min guard in order to demo improved BLE strength.    Baseline 16 sec from mat with light UE support to rise on 05/28/20    Time 4    Period Weeks    Status Achieved    Target Date 05/22/20      PT SHORT TERM GOAL #4   Title Pt will stand for 2" without UE support at counter with SBA for incr. independence with ADL's.    Baseline Pt stood >2 min without UE support supervision on 05/28/20    Time 4    Period Weeks    Status Achieved    Target Date 05/22/20             PT Long Term Goals - 06/04/20 1132      PT LONG TERM GOAL #1   Title Pt and pt's wife will be independent with final HEP for LE stretching and strengthening. ALL LTGS DUE 06-26-20: revised LTG - independent in updated HEP to include aquatic exs.    Baseline Pt reports that he is doing about 30 min of exercises a day. Is walking about 500' 2x/day on outside surfaces. Will have access to pool where he is moving.    Time 8    Period Weeks    Status Achieved  PT LONG TERM GOAL #2   Title Pt will ambulate 100' with LBQC modified independently  with AFO on LLE for increased  household accessibility    Time 8    Period Weeks    Status New      PT LONG TERM GOAL #3   Title Pt will decr 5x sit <> stand time from standard mat table to 16 seconds or less in order to demo improved functional BLE strength and improved transfer efficiency.    Baseline 22.81 seconds, 16 secon 06/04/20    Time 12    Period Weeks    Status New      PT LONG TERM GOAL #4   Title Pt will increase gait speed from 0.28 m/s to >1.0 m/s with RW for improved household mobility.    Baseline .28 m/s    Time 8    Period Weeks    Status New      PT LONG TERM GOAL #5   Title Pt will negotiate 4 steps with bil. UE support on hand rails modified independently using a step over step sequence.    Time 12    Period Weeks    Status Achieved                 Plan - 06/05/20 0755    Clinical Impression Statement Pt continues to show progress toward goals. He is requiring less assistance with gait with RW at supervision level. Unable to transition to cane at this time as still needing more UE support to advance LLE but has started working some in // bars. Pt has difficulty getting left hip/knee flexion in standing and gait due to tone. Trialed elliptical for first time with +2 assist and while it was fatiguing pt was able to get hip/knee flexion for first time. Pt continues to benefit from PT continue to address strength, flexilibity, balance and gait deficits.    Personal Factors and Comorbidities Past/Current Experience    Examination-Activity Limitations Bathing;Stand;Squat;Locomotion Level;Bend;Hygiene/Grooming;Transfers    Examination-Participation Restrictions Cleaning;Community Activity;Yard Work    Rehab Potential Good    PT Frequency 2x / week    PT Duration 8 weeks    PT Treatment/Interventions ADLs/Self Care Home Management;Aquatic Therapy;Electrical Stimulation;DME Instruction;Gait training;Stair training;Functional mobility training;Neuromuscular re-education;Balance  training;Therapeutic exercise;Therapeutic activities;Patient/family education;Orthotic Fit/Training;Passive range of motion    PT Next Visit Plan Aquatic PT next session. Next land session will be d/c as pt moving out of town.    [place] [id number] Code: [id number]: [email or web address]: 03/22/2021Prepared by: Vaughan Basta DildayExercisesBridge with Hip Abduction and Resistance - 1 x daily - 7 x weekly - 1 sets - 10 repsMarching Bridge - 1 x daily - 7 x weekly - 1 sets - 10 repsStraight Leg Raise - 1 x daily - 7 x weekly - 1 sets - 10 repsHIP EXTENSION CONTROL EXERCISE - 1 x daily - 7 x weekly - 1 sets - 10 repsSidelying Hip Abduction - 1 x daily - 7 x weekly - 1 sets - 10 repsProne Knee Flexion Extension AROM - 1 x daily - 7 x weekly - 3 sets - 10 repsProne Hip Extension with Bent Knee - 1 x daily - 7 x weekly - 1 sets - 10 reps    Consulted and Agree with Plan of Care Patient           Patient will benefit from skilled therapeutic intervention in order to improve the following deficits and impairments:  Abnormal gait, Decreased activity  tolerance, Decreased balance, Decreased endurance, Decreased coordination, Decreased mobility, Decreased range of motion, Difficulty walking, Decreased strength, Impaired flexibility, Impaired sensation, Postural dysfunction, Pain  Visit Diagnosis: Other abnormalities of gait and mobility  Muscle weakness (generalized)     Problem List Patient Active Problem List   Diagnosis Date Noted  . Pressure injury of skin 11/11/2019  . Anemia   . Constipation   . Neurogenic bladder   . Neurogenic bowel   . Quadriplegia ([place]) 10/19/2019  . 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 Epidural abscess 10/14/2019    Electa Sniff, PT, DPT, NCS 06/05/2020, 8:02 AM  [facility] [address] [place], [place],  [address] Phone: [phone]   Fax:  [phone]  Name: Ryan Reed MRN: [id number] Date of Birth: 1951-07-25

## 2020-06-08 ENCOUNTER — Ambulatory Visit: Payer: Medicare Other | Admitting: Physical Therapy

## 2020-06-08 ENCOUNTER — Encounter: Payer: Self-pay | Admitting: Physical Therapy

## 2020-06-08 ENCOUNTER — Other Ambulatory Visit: Payer: Self-pay

## 2020-06-08 DIAGNOSIS — R2681 Unsteadiness on feet: Secondary | ICD-10-CM

## 2020-06-08 DIAGNOSIS — M6281 Muscle weakness (generalized): Secondary | ICD-10-CM | POA: Diagnosis not present

## 2020-06-08 DIAGNOSIS — R2689 Other abnormalities of gait and mobility: Secondary | ICD-10-CM | POA: Diagnosis not present

## 2020-06-08 NOTE — Therapy (Signed)
[facility] [address] [place], [place], [address] Phone: [phone]   Fax:  [phone]  Physical Therapy Treatment  Patient Details  Name: Ryan Reed MRN: [id number] Date of Birth: [birth year] Referring Provider (PT): [name], MD   Encounter Date: 06/08/2020   PT End of Session - 06/08/20 1441    Visit Number 31    Number of Visits 40    Date for PT Re-Evaluation 06/26/20    Authorization Type UHC Medicare    Authorization Time Period 04-23-20 - 07-21-20    PT Start Time 1340    PT Stop Time 1425    PT Time Calculation (min) 45 min    Equipment Utilized During Treatment Other (comment)   pool noodle, neck noodle   Activity Tolerance Patient tolerated treatment well    Behavior During Therapy [facility] for tasks assessed/performed           History reviewed. No pertinent past medical history.  Past Surgical History:  Procedure Laterality Date   LACERATION REPAIR Left    left thigh   THORACIC LAMINECTOMY FOR EPIDURAL ABSCESS N/A 10/14/2019   Procedure: Cervical seven - THORACIC seven LAMINECTOMIES FOR EPIDURAL ABSCESS;  Surgeon: [name], MD;  Location: [facility];  Service: Neurosurgery;  Laterality: N/A;    There were no vitals filed for this visit.   Subjective Assessment - 06/08/20 1439    Subjective Pt reports that he is tentatively moving 7/29 to [place].  Still having OP surgery tomorrow and is aware that he will need reorder to resume PT.    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 No/denies    Pain Onset More than a month ago           Aquatic therapy at [facility] - pool temp 87.4 degrees  Patient seen for aquatic therapy today. Treatment took place in water 4 feet deep depending upon activity. Pt entered and exited  the pool via ramp negotiation with  use of bil. Hand rails withSBA.  Pt performed hamstring stretch (runner's stretch) for each leg - 1 rep each - 30 sec hold  Pt gait trained approx.86macross pool2reps with min assist with pt holding noodle, stabilized by PTA; sideways amb. 2 reps approx.255mcross pool with mod assist on pool noodle stabilized by PTA; backwards amb 2549m2 with same assist and pool noodle.  Squats x 10 reps with bil. UE support on pool wall: Performed 4 sets total through out session due to tone in L quad.  Marching (as able in flexing Lt hip and knee) with neck noodle under/around L anke to assist with flexion x 10 reps x 2 sets and UE support of pool edge.  Pt performed supine exercises - pool noodle used for flotation under bil arms- bicycling LE's with pt flexing Lt knee as much as possible for 68m26m; hip abduction/adduction 63m 44mwith PTA propelling pt backwards.   Modified leg press - pt supported by PTA - feet on wall - pushed into hip and knee flexion for closed chain strengthening 10 reps Bil. Knee to chest with pt given assistance for Lt knee flexion by gently pushing pt's feet into pool wall to fascilitate Lt knee flexion - 10 reps  Side stepping squat along pool railing x 63m109m  x 2 reps.  Pt exited the pool via steps with bil rails and SBA.  Pt requires the buoyancy of water for active assisted exercises with buoyancy supported for strengthening & ROM exercises: pt requires the viscosity of the water for resistance with strengthening exercises Gait training without device can be performed in water with minimal fall risk in the water, which is unable for pt to perform on land with the reduced fall risk Water current provides perturbations which challenge standing balance unsupported     PT Short Term Goals - 06/05/20 0749      PT SHORT TERM GOAL #1   Title Pt will increase gait speed from 0.68ms to > .8 m/s for improved household mobility.  ALL STGS DUE 05-23-20    Baseline  .28 m/s on 03/17/20, 0.427m on 05/28/20    Time 4    Period Weeks    Status Not Met    Target Date 05/22/20      PT SHORT TERM GOAL #2   Title Pt will amb. 5017[facility] for increased household accessibility.    Baseline 345' supervision with RW, unable to walk with quad cane yet    Time 4    Period Weeks    Status Not Met    Target Date 05/22/20      PT SHORT TERM GOAL #3   Title Pt will decr 5x sit <> stand time from mat table at standard height to 20 seconds or less with min guard in order to demo improved BLE strength.    Baseline 16 sec from mat with light UE support to rise on 05/28/20    Time 4    Period Weeks    Status Achieved    Target Date 05/22/20      PT SHORT TERM GOAL #4   Title Pt will stand for 2" without UE support at counter with SBA for incr. independence with ADL's.    Baseline Pt stood >2 min without UE support supervision on 05/28/20    Time 4    Period Weeks    Status Achieved    Target Date 05/22/20             PT Long Term Goals - 06/04/20 1132      PT LONG TERM GOAL #1   Title Pt and pt's wife will be independent with final HEP for LE stretching and strengthening. ALL LTGS DUE 06-26-20: revised LTG - independent in updated HEP to include aquatic exs.    Baseline Pt reports that he is doing about 30 min of exercises a day. Is walking about 500' 2x/day on outside surfaces. Will have access to pool where he is moving.    Time 8    Period Weeks    Status Achieved      PT LONG TERM GOAL #2   Title Pt will ambulate 100' with LBQC modified independently  with AFO on LLE for increased household accessibility    Time 8    Period Weeks    Status New      PT LONG TERM GOAL #3   Title Pt will decr 5x sit <> stand time from standard mat table to 16 seconds or less in order to demo improved functional BLE strength and improved transfer efficiency.    Baseline 22.81 seconds, 16 secon 06/04/20    Time 12    Period Weeks    Status New      PT LONG  TERM  GOAL #4   Title Pt will increase gait speed from 0.28 m/s to >1.0 m/s with RW for improved household mobility.    Baseline .28 m/s    Time 8    Period Weeks    Status New      PT LONG TERM GOAL #5   Title Pt will negotiate 4 steps with bil. UE support on hand rails modified independently using a step over step sequence.    Time 12    Period Weeks    Status Achieved                 Plan - 06/08/20 1441    Clinical Impression Statement Pt continues with increased tone in L quad and stiffness impacting L knee flexion.  Pt progressing with gait in pool and requiring decreased UE assist from PTA each time.  Pt having OP surgery tomorrow and is aware he would need a reorder to resume PT.  Pt plans to f/u with office staff after his procedure tomorrow regarding scheduling.    Personal Factors and Comorbidities Past/Current Experience    Examination-Activity Limitations Bathing;Stand;Squat;Locomotion Level;Bend;Hygiene/Grooming;Transfers    Examination-Participation Restrictions Cleaning;Community Activity;Yard Work    Rehab Potential Good    PT Frequency 2x / week    PT Duration 8 weeks    PT Treatment/Interventions ADLs/Self Care Home Management;Aquatic Therapy;Electrical Stimulation;DME Instruction;Gait training;Stair training;Functional mobility training;Neuromuscular re-education;Balance training;Therapeutic exercise;Therapeutic activities;Patient/family education;Orthotic Fit/Training;Passive range of motion    PT Next Visit Plan Make sure pt has reorder/activity orders after surgery before he is seen again.  Next land session will be d/c as pt moving out of town.    [place] [id number] Code: [id number]: [email or web address]: 03/22/2021Prepared by: Vaughan Basta DildayExercisesBridge with Hip Abduction and Resistance - 1 x daily - 7 x weekly - 1 sets - 10 repsMarching Bridge - 1 x daily - 7 x weekly - 1 sets - 10 repsStraight Leg Raise - 1 x  daily - 7 x weekly - 1 sets - 10 repsHIP EXTENSION CONTROL EXERCISE - 1 x daily - 7 x weekly - 1 sets - 10 repsSidelying Hip Abduction - 1 x daily - 7 x weekly - 1 sets - 10 repsProne Knee Flexion Extension AROM - 1 x daily - 7 x weekly - 3 sets - 10 repsProne Hip Extension with Bent Knee - 1 x daily - 7 x weekly - 1 sets - 10 reps    Consulted and Agree with Plan of Care Patient           Patient will benefit from skilled therapeutic intervention in order to improve the following deficits and impairments:  Abnormal gait, Decreased activity tolerance, Decreased balance, Decreased endurance, Decreased coordination, Decreased mobility, Decreased range of motion, Difficulty walking, Decreased strength, Impaired flexibility, Impaired sensation, Postural dysfunction, Pain  Visit Diagnosis: Other abnormalities of gait and mobility  Muscle weakness (generalized)  Unsteadiness on feet     Problem List Patient Active Problem List   Diagnosis Date Noted   Pressure injury of skin 11/11/2019   Anemia    Constipation    Neurogenic bladder    Neurogenic bowel    Quadriplegia ([facility]) 10/19/2019   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

## 2020-06-09 DIAGNOSIS — N319 Neuromuscular dysfunction of bladder, unspecified: Secondary | ICD-10-CM | POA: Diagnosis not present

## 2020-06-09 DIAGNOSIS — D171 Benign lipomatous neoplasm of skin and subcutaneous tissue of trunk: Secondary | ICD-10-CM | POA: Diagnosis not present

## 2020-06-09 DIAGNOSIS — G8220 Paraplegia, unspecified: Secondary | ICD-10-CM | POA: Diagnosis not present

## 2020-06-09 DIAGNOSIS — K592 Neurogenic bowel, not elsewhere classified: Secondary | ICD-10-CM | POA: Diagnosis not present

## 2020-06-09 DIAGNOSIS — L821 Other seborrheic keratosis: Secondary | ICD-10-CM | POA: Diagnosis not present

## 2020-06-11 ENCOUNTER — Ambulatory Visit: Payer: Medicare Other | Admitting: Physical Therapy

## 2020-06-16 DIAGNOSIS — R339 Retention of urine, unspecified: Secondary | ICD-10-CM | POA: Diagnosis not present

## 2020-06-16 DIAGNOSIS — N319 Neuromuscular dysfunction of bladder, unspecified: Secondary | ICD-10-CM | POA: Diagnosis not present

## 2020-06-16 DIAGNOSIS — G8250 Quadriplegia, unspecified: Secondary | ICD-10-CM | POA: Diagnosis not present

## 2020-06-18 ENCOUNTER — Ambulatory Visit: Payer: Medicare Other | Admitting: Physical Therapy

## 2020-06-29 DIAGNOSIS — G549 Nerve root and plexus disorder, unspecified: Secondary | ICD-10-CM | POA: Diagnosis not present

## 2020-06-29 NOTE — Therapy (Signed)
[facility] [address] [place], [place], [address] Phone: [phone]   Fax:  [phone]  Patient Details  Name: Ryan Reed MRN: [id number] Date of Birth: January 23, 1951 Referring Provider:  No ref. provider found  Encounter Date: 06/29/2020  PHYSICAL THERAPY DISCHARGE SUMMARY /Non visit d/c  Visits from Start of Care: 31  Current functional level related to goals / functional outcomes: Pt is progressing well with therapy. He is requiring less assistance with gait with RW at supervision level. Unable to transition to cane at this time as still needing more UE support to advance LLE. Pt did not return for discharge visit as is moving to [place]. He will be transferring care there.    Remaining deficits: Weakness, balance deficits   Education / Equipment: HEP  Plan: Patient agrees to discharge.  Patient goals were not met. Patient is being discharged due to                                                     ??? Moving out of town.??    PT Short Term Goals - 06/05/20 0749      PT SHORT TERM GOAL #1   Title Pt will increase gait speed from 0.62ms to > .8 m/s for improved household mobility.  ALL STGS DUE 05-23-20    Baseline .28 m/s on 03/17/20, 0.495m on 05/28/20    Time 4    Period Weeks    Status Not Met    Target Date 05/22/20      PT SHORT TERM GOAL #2   Title Pt will amb. 5073[facility] CGA for increased household accessibility.    Baseline 345' supervision with RW, unable to walk with quad cane yet    Time 4    Period Weeks    Status Not Met    Target Date 05/22/20      PT SHORT TERM GOAL #3   Title Pt will decr 5x sit <> stand time from mat table at standard height to 20 seconds or less with min guard in order to demo improved BLE strength.    Baseline 16 sec from mat with light UE support to rise on 05/28/20    Time 4    Period Weeks    Status Achieved    Target Date 05/22/20      PT SHORT  TERM GOAL #4   Title Pt will stand for 2" without UE support at counter with SBA for incr. independence with ADL's.    Baseline Pt stood >2 min without UE support supervision on 05/28/20    Time 4    Period Weeks    Status Achieved    Target Date 05/22/20           PT Long Term Goals - 06/04/20 1132      PT LONG TERM GOAL #1   Title Pt and pt's wife will be independent with final HEP for LE stretching and strengthening. ALL LTGS DUE 06-26-20: revised LTG - independent in updated HEP to include aquatic exs.    Baseline Pt reports that he is doing about 30 min of exercises a day. Is walking about 500' 2x/day on outside surfaces. Will have access to pool where he is moving.    Time 8    Period Weeks  Status Achieved      PT LONG TERM GOAL #2   Title Pt will ambulate 100' with LBQC modified independently  with AFO on LLE for increased household accessibility    Time 8    Period Weeks    Status New      PT LONG TERM GOAL #3   Title Pt will decr 5x sit <> stand time from standard mat table to 16 seconds or less in order to demo improved functional BLE strength and improved transfer efficiency.    Baseline 22.81 seconds, 16 secon 06/04/20    Time 12    Period Weeks    Status New      PT LONG TERM GOAL #4   Title Pt will increase gait speed from 0.28 m/s to >1.0 m/s with RW for improved household mobility.    Baseline .28 m/s    Time 8    Period Weeks    Status New      PT LONG TERM GOAL #5   Title Pt will negotiate 4 steps with bil. UE support on hand rails modified independently using a step over step sequence.    Time 12    Period Weeks    Status Achieved                     Electa Sniff, PT, DPT, NCS 06/29/2020, 7:26 AM  [facility] [address] [place], [place], [address] Phone: [phone]   Fax:  [phone]

## 2020-07-02 DIAGNOSIS — G8250 Quadriplegia, unspecified: Secondary | ICD-10-CM | POA: Diagnosis not present

## 2020-07-10 DIAGNOSIS — L723 Sebaceous cyst: Secondary | ICD-10-CM | POA: Diagnosis not present

## 2020-07-14 DIAGNOSIS — R339 Retention of urine, unspecified: Secondary | ICD-10-CM | POA: Diagnosis not present

## 2020-07-14 DIAGNOSIS — G8250 Quadriplegia, unspecified: Secondary | ICD-10-CM | POA: Diagnosis not present

## 2020-07-14 DIAGNOSIS — N319 Neuromuscular dysfunction of bladder, unspecified: Secondary | ICD-10-CM | POA: Diagnosis not present

## 2020-07-15 DIAGNOSIS — R05 Cough: Secondary | ICD-10-CM | POA: Diagnosis not present

## 2020-07-15 DIAGNOSIS — U071 COVID-19: Secondary | ICD-10-CM | POA: Diagnosis not present

## 2020-07-20 DIAGNOSIS — U071 COVID-19: Secondary | ICD-10-CM | POA: Diagnosis not present

## 2020-07-20 DIAGNOSIS — R509 Fever, unspecified: Secondary | ICD-10-CM | POA: Diagnosis not present

## 2020-07-20 DIAGNOSIS — R05 Cough: Secondary | ICD-10-CM | POA: Diagnosis not present

## 2020-07-28 DIAGNOSIS — R269 Unspecified abnormalities of gait and mobility: Secondary | ICD-10-CM | POA: Diagnosis not present

## 2020-07-28 DIAGNOSIS — M6281 Muscle weakness (generalized): Secondary | ICD-10-CM | POA: Diagnosis not present

## 2020-07-28 DIAGNOSIS — M545 Low back pain: Secondary | ICD-10-CM | POA: Diagnosis not present

## 2020-07-30 DIAGNOSIS — M545 Low back pain: Secondary | ICD-10-CM | POA: Diagnosis not present

## 2020-07-30 DIAGNOSIS — M6281 Muscle weakness (generalized): Secondary | ICD-10-CM | POA: Diagnosis not present

## 2020-07-30 DIAGNOSIS — R269 Unspecified abnormalities of gait and mobility: Secondary | ICD-10-CM | POA: Diagnosis not present

## 2020-08-02 DIAGNOSIS — G8250 Quadriplegia, unspecified: Secondary | ICD-10-CM | POA: Diagnosis not present

## 2020-08-03 DIAGNOSIS — G549 Nerve root and plexus disorder, unspecified: Secondary | ICD-10-CM | POA: Diagnosis not present

## 2020-08-07 DIAGNOSIS — R269 Unspecified abnormalities of gait and mobility: Secondary | ICD-10-CM | POA: Diagnosis not present

## 2020-08-07 DIAGNOSIS — Z0000 Encounter for general adult medical examination without abnormal findings: Secondary | ICD-10-CM | POA: Diagnosis not present

## 2020-08-07 DIAGNOSIS — Z136 Encounter for screening for cardiovascular disorders: Secondary | ICD-10-CM | POA: Diagnosis not present

## 2020-08-07 DIAGNOSIS — M545 Low back pain: Secondary | ICD-10-CM | POA: Diagnosis not present

## 2020-08-07 DIAGNOSIS — M6281 Muscle weakness (generalized): Secondary | ICD-10-CM | POA: Diagnosis not present

## 2020-08-11 DIAGNOSIS — M545 Low back pain: Secondary | ICD-10-CM | POA: Diagnosis not present

## 2020-08-11 DIAGNOSIS — G8250 Quadriplegia, unspecified: Secondary | ICD-10-CM | POA: Diagnosis not present

## 2020-08-11 DIAGNOSIS — M6281 Muscle weakness (generalized): Secondary | ICD-10-CM | POA: Diagnosis not present

## 2020-08-11 DIAGNOSIS — N319 Neuromuscular dysfunction of bladder, unspecified: Secondary | ICD-10-CM | POA: Diagnosis not present

## 2020-08-11 DIAGNOSIS — R269 Unspecified abnormalities of gait and mobility: Secondary | ICD-10-CM | POA: Diagnosis not present

## 2020-08-11 DIAGNOSIS — R339 Retention of urine, unspecified: Secondary | ICD-10-CM | POA: Diagnosis not present

## 2020-08-13 DIAGNOSIS — M6281 Muscle weakness (generalized): Secondary | ICD-10-CM | POA: Diagnosis not present

## 2020-08-13 DIAGNOSIS — M545 Low back pain: Secondary | ICD-10-CM | POA: Diagnosis not present

## 2020-08-13 DIAGNOSIS — R269 Unspecified abnormalities of gait and mobility: Secondary | ICD-10-CM | POA: Diagnosis not present

## 2020-08-24 DIAGNOSIS — M47816 Spondylosis without myelopathy or radiculopathy, lumbar region: Secondary | ICD-10-CM | POA: Diagnosis not present

## 2020-08-24 DIAGNOSIS — M6281 Muscle weakness (generalized): Secondary | ICD-10-CM | POA: Diagnosis not present

## 2020-08-24 DIAGNOSIS — R269 Unspecified abnormalities of gait and mobility: Secondary | ICD-10-CM | POA: Diagnosis not present

## 2020-08-31 DIAGNOSIS — R269 Unspecified abnormalities of gait and mobility: Secondary | ICD-10-CM | POA: Diagnosis not present

## 2020-08-31 DIAGNOSIS — M47816 Spondylosis without myelopathy or radiculopathy, lumbar region: Secondary | ICD-10-CM | POA: Diagnosis not present

## 2020-08-31 DIAGNOSIS — M6281 Muscle weakness (generalized): Secondary | ICD-10-CM | POA: Diagnosis not present

## 2020-09-01 DIAGNOSIS — G8250 Quadriplegia, unspecified: Secondary | ICD-10-CM | POA: Diagnosis not present

## 2020-09-01 DIAGNOSIS — R339 Retention of urine, unspecified: Secondary | ICD-10-CM | POA: Diagnosis not present

## 2020-09-01 DIAGNOSIS — N319 Neuromuscular dysfunction of bladder, unspecified: Secondary | ICD-10-CM | POA: Diagnosis not present

## 2020-09-04 DIAGNOSIS — R269 Unspecified abnormalities of gait and mobility: Secondary | ICD-10-CM | POA: Diagnosis not present

## 2020-09-04 DIAGNOSIS — M47816 Spondylosis without myelopathy or radiculopathy, lumbar region: Secondary | ICD-10-CM | POA: Diagnosis not present

## 2020-09-04 DIAGNOSIS — M6281 Muscle weakness (generalized): Secondary | ICD-10-CM | POA: Diagnosis not present

## 2020-09-07 ENCOUNTER — Ambulatory Visit: Payer: Medicare Other | Admitting: Physical Medicine and Rehabilitation

## 2020-09-07 DIAGNOSIS — M47816 Spondylosis without myelopathy or radiculopathy, lumbar region: Secondary | ICD-10-CM | POA: Diagnosis not present

## 2020-09-07 DIAGNOSIS — M6281 Muscle weakness (generalized): Secondary | ICD-10-CM | POA: Diagnosis not present

## 2020-09-07 DIAGNOSIS — R269 Unspecified abnormalities of gait and mobility: Secondary | ICD-10-CM | POA: Diagnosis not present

## 2020-09-11 ENCOUNTER — Ambulatory Visit: Payer: Medicare Other | Admitting: Physical Medicine and Rehabilitation

## 2020-09-11 DIAGNOSIS — R269 Unspecified abnormalities of gait and mobility: Secondary | ICD-10-CM | POA: Diagnosis not present

## 2020-09-11 DIAGNOSIS — M6281 Muscle weakness (generalized): Secondary | ICD-10-CM | POA: Diagnosis not present

## 2020-09-11 DIAGNOSIS — M47816 Spondylosis without myelopathy or radiculopathy, lumbar region: Secondary | ICD-10-CM | POA: Diagnosis not present

## 2020-09-14 DIAGNOSIS — R269 Unspecified abnormalities of gait and mobility: Secondary | ICD-10-CM | POA: Diagnosis not present

## 2020-09-14 DIAGNOSIS — M6281 Muscle weakness (generalized): Secondary | ICD-10-CM | POA: Diagnosis not present

## 2020-09-14 DIAGNOSIS — M47816 Spondylosis without myelopathy or radiculopathy, lumbar region: Secondary | ICD-10-CM | POA: Diagnosis not present

## 2020-09-21 DIAGNOSIS — R269 Unspecified abnormalities of gait and mobility: Secondary | ICD-10-CM | POA: Diagnosis not present

## 2020-09-21 DIAGNOSIS — M47816 Spondylosis without myelopathy or radiculopathy, lumbar region: Secondary | ICD-10-CM | POA: Diagnosis not present

## 2020-09-21 DIAGNOSIS — M6281 Muscle weakness (generalized): Secondary | ICD-10-CM | POA: Diagnosis not present

## 2020-09-23 DIAGNOSIS — M47816 Spondylosis without myelopathy or radiculopathy, lumbar region: Secondary | ICD-10-CM | POA: Diagnosis not present

## 2020-09-23 DIAGNOSIS — R269 Unspecified abnormalities of gait and mobility: Secondary | ICD-10-CM | POA: Diagnosis not present

## 2020-09-23 DIAGNOSIS — M6281 Muscle weakness (generalized): Secondary | ICD-10-CM | POA: Diagnosis not present

## 2020-09-24 DIAGNOSIS — R339 Retention of urine, unspecified: Secondary | ICD-10-CM | POA: Diagnosis not present

## 2020-09-24 DIAGNOSIS — N319 Neuromuscular dysfunction of bladder, unspecified: Secondary | ICD-10-CM | POA: Diagnosis not present

## 2020-09-24 DIAGNOSIS — G8250 Quadriplegia, unspecified: Secondary | ICD-10-CM | POA: Diagnosis not present

## 2020-09-30 DIAGNOSIS — M6281 Muscle weakness (generalized): Secondary | ICD-10-CM | POA: Diagnosis not present

## 2020-09-30 DIAGNOSIS — M47816 Spondylosis without myelopathy or radiculopathy, lumbar region: Secondary | ICD-10-CM | POA: Diagnosis not present

## 2020-09-30 DIAGNOSIS — R269 Unspecified abnormalities of gait and mobility: Secondary | ICD-10-CM | POA: Diagnosis not present

## 2020-10-02 DIAGNOSIS — M47816 Spondylosis without myelopathy or radiculopathy, lumbar region: Secondary | ICD-10-CM | POA: Diagnosis not present

## 2020-10-02 DIAGNOSIS — M6281 Muscle weakness (generalized): Secondary | ICD-10-CM | POA: Diagnosis not present

## 2020-10-02 DIAGNOSIS — R269 Unspecified abnormalities of gait and mobility: Secondary | ICD-10-CM | POA: Diagnosis not present

## 2020-10-05 DIAGNOSIS — M47816 Spondylosis without myelopathy or radiculopathy, lumbar region: Secondary | ICD-10-CM | POA: Diagnosis not present

## 2020-10-05 DIAGNOSIS — M6281 Muscle weakness (generalized): Secondary | ICD-10-CM | POA: Diagnosis not present

## 2020-10-05 DIAGNOSIS — R269 Unspecified abnormalities of gait and mobility: Secondary | ICD-10-CM | POA: Diagnosis not present

## 2020-10-12 DIAGNOSIS — M6281 Muscle weakness (generalized): Secondary | ICD-10-CM | POA: Diagnosis not present

## 2020-10-12 DIAGNOSIS — M47816 Spondylosis without myelopathy or radiculopathy, lumbar region: Secondary | ICD-10-CM | POA: Diagnosis not present

## 2020-10-12 DIAGNOSIS — R269 Unspecified abnormalities of gait and mobility: Secondary | ICD-10-CM | POA: Diagnosis not present

## 2020-10-19 DIAGNOSIS — R269 Unspecified abnormalities of gait and mobility: Secondary | ICD-10-CM | POA: Diagnosis not present

## 2020-10-19 DIAGNOSIS — M6281 Muscle weakness (generalized): Secondary | ICD-10-CM | POA: Diagnosis not present

## 2020-10-19 DIAGNOSIS — M47816 Spondylosis without myelopathy or radiculopathy, lumbar region: Secondary | ICD-10-CM | POA: Diagnosis not present

## 2020-10-21 DIAGNOSIS — G8250 Quadriplegia, unspecified: Secondary | ICD-10-CM | POA: Diagnosis not present

## 2020-10-21 DIAGNOSIS — R339 Retention of urine, unspecified: Secondary | ICD-10-CM | POA: Diagnosis not present

## 2020-10-21 DIAGNOSIS — N319 Neuromuscular dysfunction of bladder, unspecified: Secondary | ICD-10-CM | POA: Diagnosis not present

## 2020-10-23 DIAGNOSIS — M6281 Muscle weakness (generalized): Secondary | ICD-10-CM | POA: Diagnosis not present

## 2020-10-23 DIAGNOSIS — M47816 Spondylosis without myelopathy or radiculopathy, lumbar region: Secondary | ICD-10-CM | POA: Diagnosis not present

## 2020-10-23 DIAGNOSIS — R269 Unspecified abnormalities of gait and mobility: Secondary | ICD-10-CM | POA: Diagnosis not present

## 2020-10-26 DIAGNOSIS — R269 Unspecified abnormalities of gait and mobility: Secondary | ICD-10-CM | POA: Diagnosis not present

## 2020-10-26 DIAGNOSIS — M6281 Muscle weakness (generalized): Secondary | ICD-10-CM | POA: Diagnosis not present

## 2020-10-26 DIAGNOSIS — M47816 Spondylosis without myelopathy or radiculopathy, lumbar region: Secondary | ICD-10-CM | POA: Diagnosis not present

## 2020-11-03 DIAGNOSIS — M47816 Spondylosis without myelopathy or radiculopathy, lumbar region: Secondary | ICD-10-CM | POA: Diagnosis not present

## 2020-11-03 DIAGNOSIS — M6281 Muscle weakness (generalized): Secondary | ICD-10-CM | POA: Diagnosis not present

## 2020-11-03 DIAGNOSIS — R269 Unspecified abnormalities of gait and mobility: Secondary | ICD-10-CM | POA: Diagnosis not present

## 2020-11-06 DIAGNOSIS — R269 Unspecified abnormalities of gait and mobility: Secondary | ICD-10-CM | POA: Diagnosis not present

## 2020-11-06 DIAGNOSIS — M6281 Muscle weakness (generalized): Secondary | ICD-10-CM | POA: Diagnosis not present

## 2020-11-06 DIAGNOSIS — M47816 Spondylosis without myelopathy or radiculopathy, lumbar region: Secondary | ICD-10-CM | POA: Diagnosis not present

## 2020-11-09 DIAGNOSIS — M47816 Spondylosis without myelopathy or radiculopathy, lumbar region: Secondary | ICD-10-CM | POA: Diagnosis not present

## 2020-11-09 DIAGNOSIS — R269 Unspecified abnormalities of gait and mobility: Secondary | ICD-10-CM | POA: Diagnosis not present

## 2020-11-09 DIAGNOSIS — M6281 Muscle weakness (generalized): Secondary | ICD-10-CM | POA: Diagnosis not present

## 2020-11-20 DIAGNOSIS — R269 Unspecified abnormalities of gait and mobility: Secondary | ICD-10-CM | POA: Diagnosis not present

## 2020-11-20 DIAGNOSIS — M6281 Muscle weakness (generalized): Secondary | ICD-10-CM | POA: Diagnosis not present

## 2020-11-20 DIAGNOSIS — M47816 Spondylosis without myelopathy or radiculopathy, lumbar region: Secondary | ICD-10-CM | POA: Diagnosis not present

## 2020-11-23 DIAGNOSIS — M47816 Spondylosis without myelopathy or radiculopathy, lumbar region: Secondary | ICD-10-CM | POA: Diagnosis not present

## 2020-11-23 DIAGNOSIS — M6281 Muscle weakness (generalized): Secondary | ICD-10-CM | POA: Diagnosis not present

## 2020-11-23 DIAGNOSIS — R269 Unspecified abnormalities of gait and mobility: Secondary | ICD-10-CM | POA: Diagnosis not present

## 2020-11-27 DIAGNOSIS — M6281 Muscle weakness (generalized): Secondary | ICD-10-CM | POA: Diagnosis not present

## 2020-11-27 DIAGNOSIS — R269 Unspecified abnormalities of gait and mobility: Secondary | ICD-10-CM | POA: Diagnosis not present

## 2020-11-27 DIAGNOSIS — M47816 Spondylosis without myelopathy or radiculopathy, lumbar region: Secondary | ICD-10-CM | POA: Diagnosis not present

## 2020-11-27 DIAGNOSIS — R339 Retention of urine, unspecified: Secondary | ICD-10-CM | POA: Diagnosis not present

## 2020-11-27 DIAGNOSIS — G8250 Quadriplegia, unspecified: Secondary | ICD-10-CM | POA: Diagnosis not present

## 2020-11-27 DIAGNOSIS — N319 Neuromuscular dysfunction of bladder, unspecified: Secondary | ICD-10-CM | POA: Diagnosis not present

## 2020-11-30 DIAGNOSIS — R509 Fever, unspecified: Secondary | ICD-10-CM | POA: Diagnosis not present

## 2020-11-30 DIAGNOSIS — R058 Other specified cough: Secondary | ICD-10-CM | POA: Diagnosis not present

## 2020-12-14 DIAGNOSIS — R269 Unspecified abnormalities of gait and mobility: Secondary | ICD-10-CM | POA: Diagnosis not present

## 2020-12-14 DIAGNOSIS — M6281 Muscle weakness (generalized): Secondary | ICD-10-CM | POA: Diagnosis not present

## 2020-12-14 DIAGNOSIS — M47816 Spondylosis without myelopathy or radiculopathy, lumbar region: Secondary | ICD-10-CM | POA: Diagnosis not present

## 2020-12-17 DIAGNOSIS — M47816 Spondylosis without myelopathy or radiculopathy, lumbar region: Secondary | ICD-10-CM | POA: Diagnosis not present

## 2020-12-17 DIAGNOSIS — R269 Unspecified abnormalities of gait and mobility: Secondary | ICD-10-CM | POA: Diagnosis not present

## 2020-12-17 DIAGNOSIS — M6281 Muscle weakness (generalized): Secondary | ICD-10-CM | POA: Diagnosis not present

## 2020-12-20 IMAGING — MR MR CERVICAL SPINE WO/W CM
11 of 28 series · 18 of 48 positions shown · IV contrast (9 GV)
Comparison: None.

CLINICAL DATA: Back pain. Bilateral lower extremity weakness.
Febrile.

EXAM:
MRI CERVICAL SPINE WITHOUT AND WITH CONTRAST
TECHNIQUE: Multiplanar and multiecho pulse sequences of the cervical spine, to
include the craniocervical junction and cervicothoracic junction,
were obtained without and with intravenous contrast.
CONTRAST:  9mL GADAVIST GADOBUTROL 1 MMOL/ML IV SOLN

[Series 2: T2 · sagittal · 3.0mm · 0.43mm/px · 1 of 15 slices shown (1 of 7)]
[im 1/15]
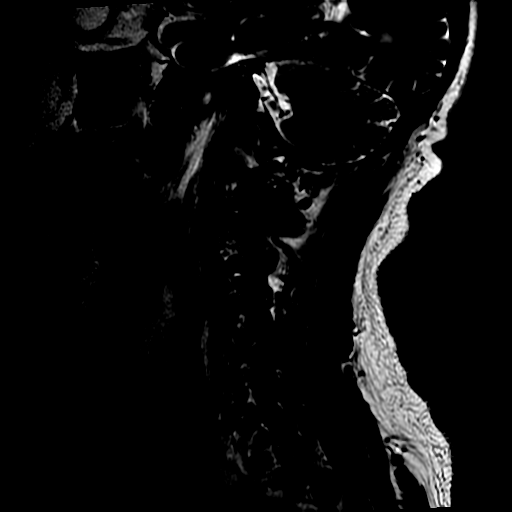

[Series 7: T2 · axial · 3.0mm · 0.35mm/px · z∈[-137,-44]mm · 2 of 32 slices shown (2 of 7)]
[im 1/32]
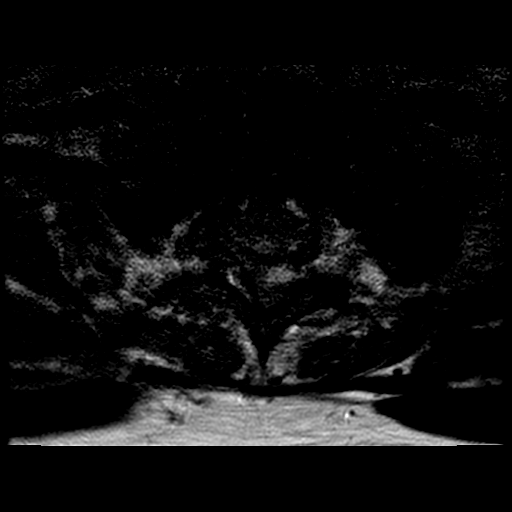
[im 32/32]
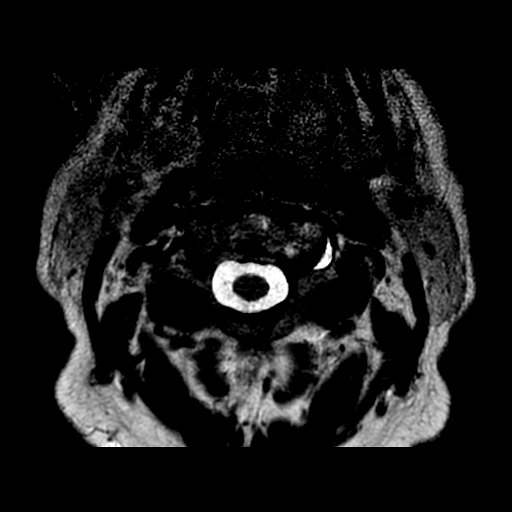

[Series 9: T1 · axial · non-contrast · 3.0mm · 0.35mm/px · z∈[-137,-44]mm · 2 of 32 slices shown (1 of 4)]
[im 1/32]
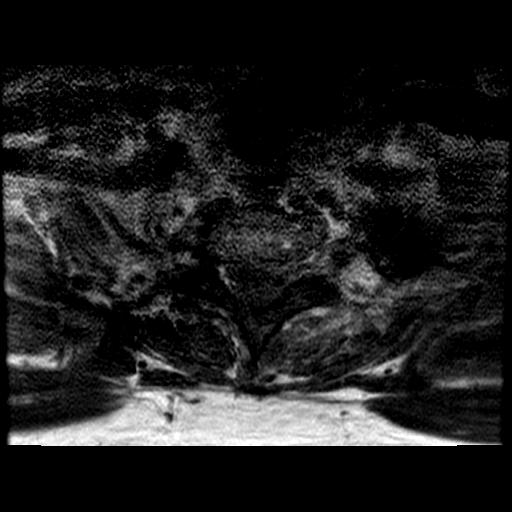
[im 32/32]
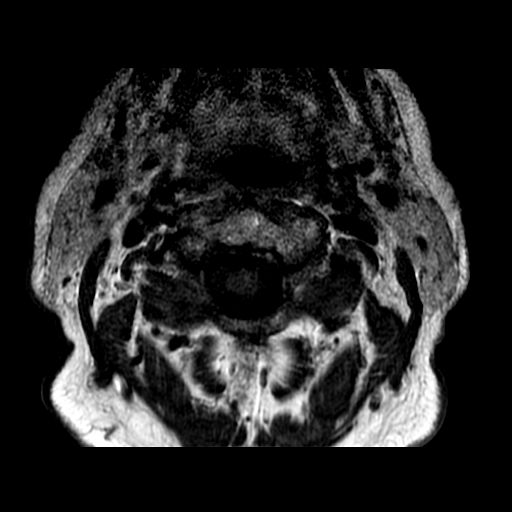

[Series 11: T2 · sagittal · 3.0mm · 0.66mm/px · 1 of 13 slices shown (3 of 7)]
[im 1/13]
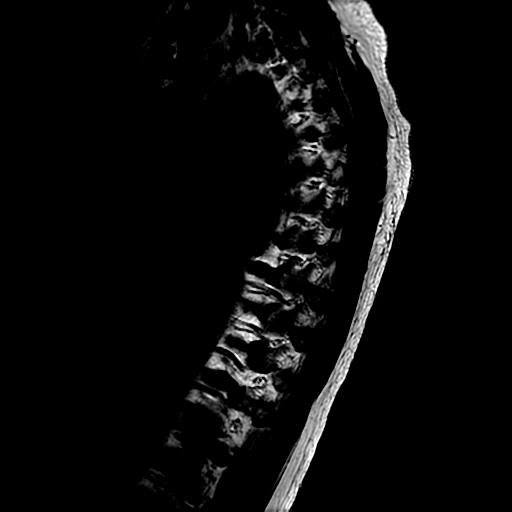

[Series 13: T1 · sagittal · 3.0mm · 0.66mm/px · 1 of 13 slices shown (2 of 4)]
[im 1/13]
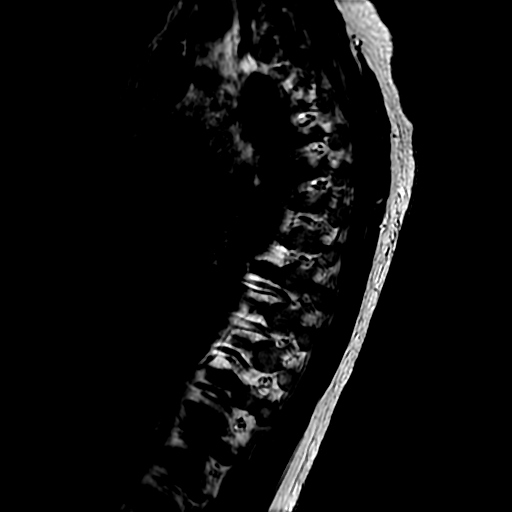

[Series 15: T2 · axial · 4.0mm · 0.39mm/px · z∈[-248,-123]mm · 2 of 25 slices shown (4 of 7)]
[im 1/25]
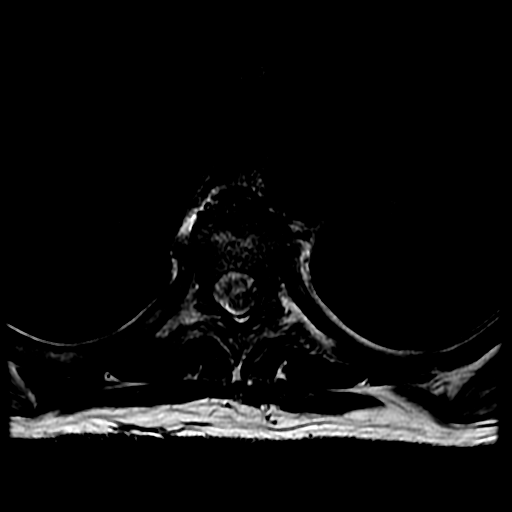
[im 25/25]
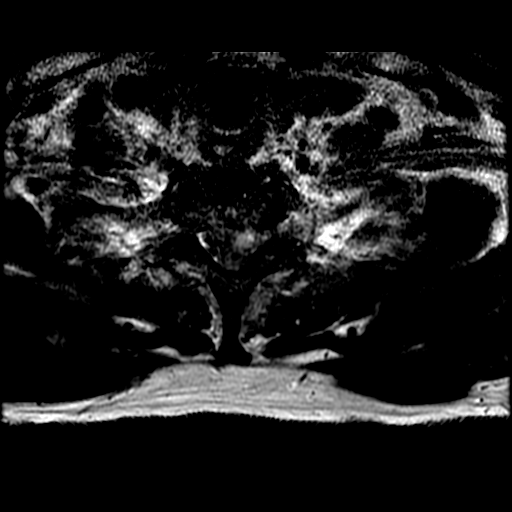

[Series 16: T2 · axial · 4.0mm · 0.39mm/px · z∈[-387,-217]mm · 2 of 32 slices shown (5 of 7)]
[im 1/32]
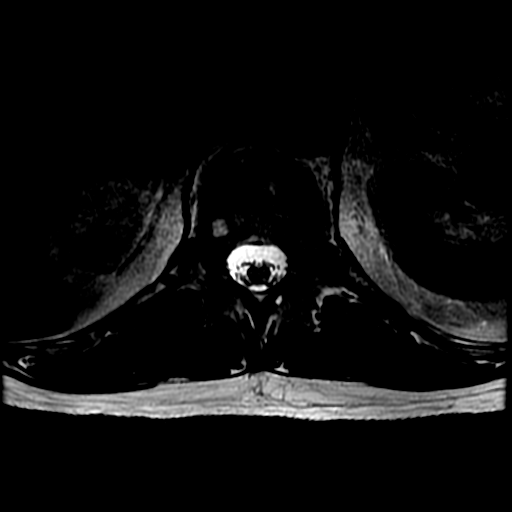
[im 32/32]
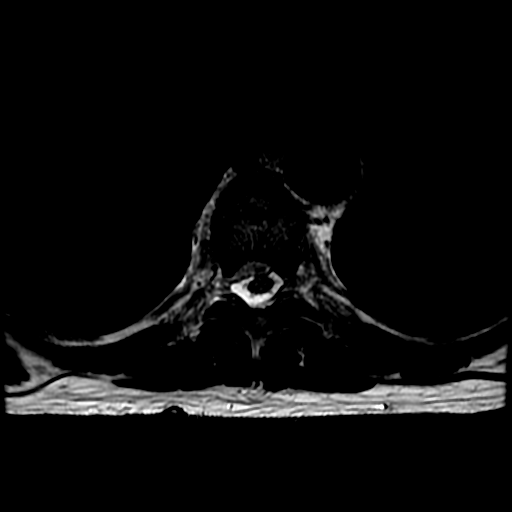

[Series 20: T1 · axial · non-contrast · 3.0mm · 0.39mm/px · z∈[-249,-123]mm · 2 of 39 slices shown (3 of 4)]
[im 1/39]
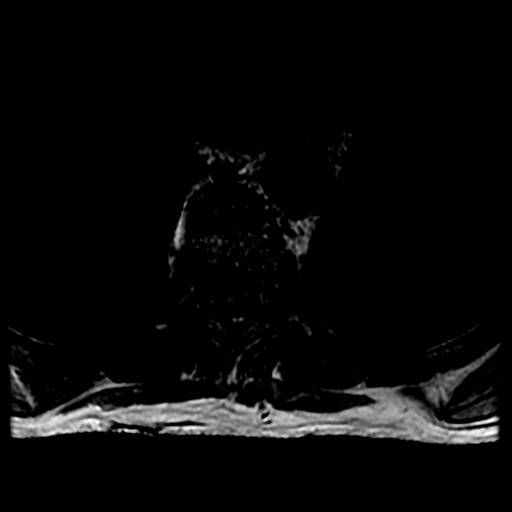
[im 39/39]
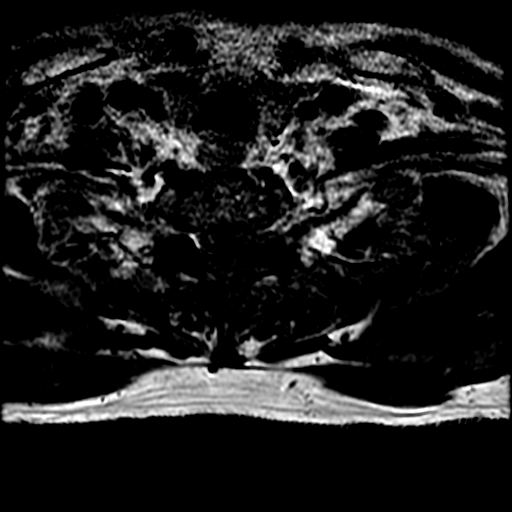

[Series 21: T1 · axial · non-contrast · 3.0mm · 0.39mm/px · z∈[-388,-304]mm · 2 of 50 slices shown (4 of 4)]
[im 1/50]
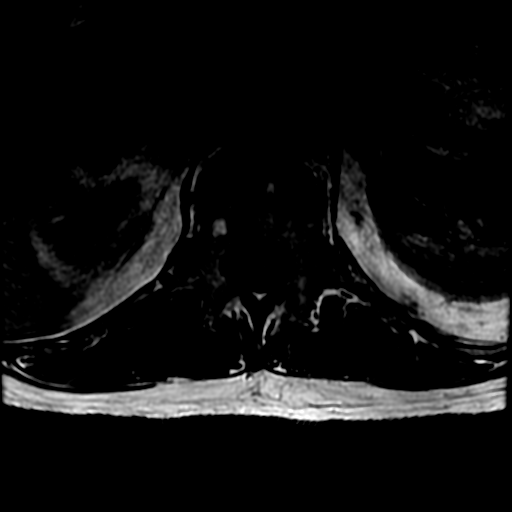
[im 25/50]
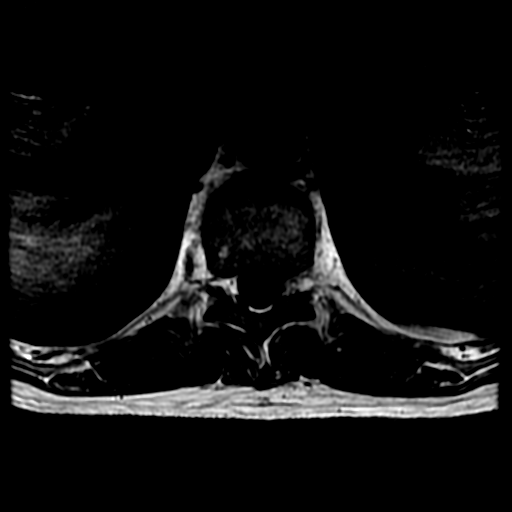

[Series 22: T2 · sagittal · 4.0mm · 0.55mm/px · 1 of 13 slices shown (6 of 7)]
[im 1/13]
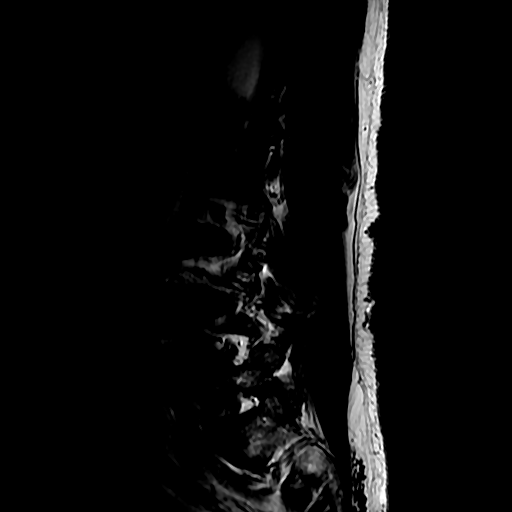

[Series 25: T2 · axial · 4.0mm · 0.39mm/px · z∈[-621,-423]mm · 2 of 36 slices shown (7 of 7)]
[im 1/36]
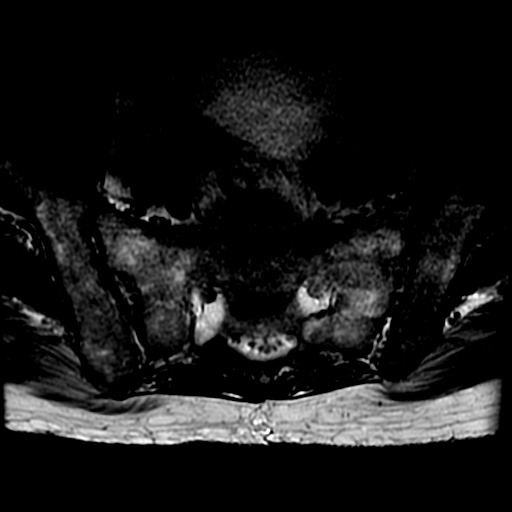
[im 36/36]
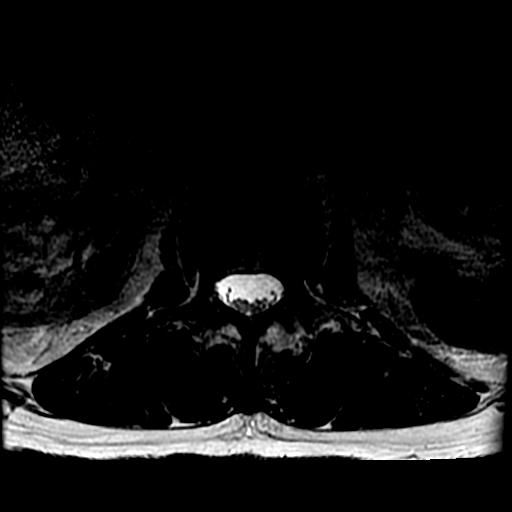

[18 of 48 positions shown; findings below may reference images not displayed]

FINDINGS: Alignment: Normal alignment. Image quality degraded by motion.

Vertebrae: Negative for fracture or mass. No evidence of discitis.
There is edema in the facet and surrounding soft tissues on the left
at C6-7. Posterior rim enhancing fluid collection posterior to the
facet measuring approximately 12 mm likely a soft tissue abscess.

Cord: Cord evaluation limited by motion.

Ventral epidural rim enhancing fluid collection from C7 into the
thoracic spine compatible with epidural abscess. This is a large
fluid collection with compression of the thoracic cord.

Posterior Fossa, vertebral arteries, paraspinal tissues: Posterior
paraspinous rim enhancing fluid collection behind the left C7-T1
facet compatible with septic arthritis an abscess related to the
facet joint. There is posterior lateral epidural thickening on the
left at this level with a 4 x 10 mm abscess in the lateral epidural
space.

Disc levels:

C2-3: Bilateral facet degeneration causing foraminal stenosis
bilaterally.

C3-4: Bilateral facet degeneration causing foraminal encroachment
bilaterally. Small central disc protrusion.

C4-5: Disc degeneration and spondylosis. Left facet degeneration.
Bilateral foraminal stenosis and mild spinal stenosis

C5-6: Disc degeneration and spondylosis. Mild spinal stenosis and
bilateral facets foraminal stenosis due to spurring

C6-7: Disc degeneration and spondylosis. Mild spinal stenosis and
moderate foraminal stenosis bilaterally

C7-T1: Left facet joint effusion with enhancement of the surrounding
soft tissues and a soft tissue abscess posterior to the left facet
joint. Lateral epidural thickening with 4 x 10 mm lateral epidural
abscess.
IMPRESSION: 1. Very large ventral epidural abscess from C7 into the thoracic
spine causing cord compression.
2. Left facet joint septic arthritis at C7-T1 with surrounding soft
tissue abscess.
3. Left lateral epidural abscess at C7-T1.
4. Multilevel degenerative change throughout the cervical spine.
5. These results were called by telephone at the time of
interpretation on 10/14/2019 at [DATE] to ED provider , who
verbally acknowledged these results.

## 2020-12-21 DIAGNOSIS — M6281 Muscle weakness (generalized): Secondary | ICD-10-CM | POA: Diagnosis not present

## 2020-12-21 DIAGNOSIS — M47816 Spondylosis without myelopathy or radiculopathy, lumbar region: Secondary | ICD-10-CM | POA: Diagnosis not present

## 2020-12-21 DIAGNOSIS — R269 Unspecified abnormalities of gait and mobility: Secondary | ICD-10-CM | POA: Diagnosis not present

## 2020-12-22 DIAGNOSIS — R339 Retention of urine, unspecified: Secondary | ICD-10-CM | POA: Diagnosis not present

## 2020-12-22 DIAGNOSIS — N319 Neuromuscular dysfunction of bladder, unspecified: Secondary | ICD-10-CM | POA: Diagnosis not present

## 2020-12-24 DIAGNOSIS — M47816 Spondylosis without myelopathy or radiculopathy, lumbar region: Secondary | ICD-10-CM | POA: Diagnosis not present

## 2020-12-24 DIAGNOSIS — R269 Unspecified abnormalities of gait and mobility: Secondary | ICD-10-CM | POA: Diagnosis not present

## 2020-12-24 DIAGNOSIS — M6281 Muscle weakness (generalized): Secondary | ICD-10-CM | POA: Diagnosis not present

## 2020-12-31 DIAGNOSIS — M47816 Spondylosis without myelopathy or radiculopathy, lumbar region: Secondary | ICD-10-CM | POA: Diagnosis not present

## 2020-12-31 DIAGNOSIS — R269 Unspecified abnormalities of gait and mobility: Secondary | ICD-10-CM | POA: Diagnosis not present

## 2020-12-31 DIAGNOSIS — M6281 Muscle weakness (generalized): Secondary | ICD-10-CM | POA: Diagnosis not present

## 2021-01-07 DIAGNOSIS — M47816 Spondylosis without myelopathy or radiculopathy, lumbar region: Secondary | ICD-10-CM | POA: Diagnosis not present

## 2021-01-07 DIAGNOSIS — M6281 Muscle weakness (generalized): Secondary | ICD-10-CM | POA: Diagnosis not present

## 2021-01-07 DIAGNOSIS — R269 Unspecified abnormalities of gait and mobility: Secondary | ICD-10-CM | POA: Diagnosis not present

## 2021-01-11 DIAGNOSIS — M47816 Spondylosis without myelopathy or radiculopathy, lumbar region: Secondary | ICD-10-CM | POA: Diagnosis not present

## 2021-01-11 DIAGNOSIS — M6281 Muscle weakness (generalized): Secondary | ICD-10-CM | POA: Diagnosis not present

## 2021-01-11 DIAGNOSIS — R269 Unspecified abnormalities of gait and mobility: Secondary | ICD-10-CM | POA: Diagnosis not present

## 2021-01-14 DIAGNOSIS — R269 Unspecified abnormalities of gait and mobility: Secondary | ICD-10-CM | POA: Diagnosis not present

## 2021-01-14 DIAGNOSIS — M47816 Spondylosis without myelopathy or radiculopathy, lumbar region: Secondary | ICD-10-CM | POA: Diagnosis not present

## 2021-01-14 DIAGNOSIS — M6281 Muscle weakness (generalized): Secondary | ICD-10-CM | POA: Diagnosis not present

## 2021-01-19 DIAGNOSIS — N319 Neuromuscular dysfunction of bladder, unspecified: Secondary | ICD-10-CM | POA: Diagnosis not present

## 2021-01-19 DIAGNOSIS — G8250 Quadriplegia, unspecified: Secondary | ICD-10-CM | POA: Diagnosis not present

## 2021-01-19 DIAGNOSIS — R339 Retention of urine, unspecified: Secondary | ICD-10-CM | POA: Diagnosis not present

## 2021-01-21 DIAGNOSIS — R269 Unspecified abnormalities of gait and mobility: Secondary | ICD-10-CM | POA: Diagnosis not present

## 2021-01-21 DIAGNOSIS — M47816 Spondylosis without myelopathy or radiculopathy, lumbar region: Secondary | ICD-10-CM | POA: Diagnosis not present

## 2021-01-21 DIAGNOSIS — M6281 Muscle weakness (generalized): Secondary | ICD-10-CM | POA: Diagnosis not present

## 2021-01-26 DIAGNOSIS — R269 Unspecified abnormalities of gait and mobility: Secondary | ICD-10-CM | POA: Diagnosis not present

## 2021-01-26 DIAGNOSIS — M6281 Muscle weakness (generalized): Secondary | ICD-10-CM | POA: Diagnosis not present

## 2021-01-26 DIAGNOSIS — M47816 Spondylosis without myelopathy or radiculopathy, lumbar region: Secondary | ICD-10-CM | POA: Diagnosis not present

## 2021-02-02 DIAGNOSIS — M47816 Spondylosis without myelopathy or radiculopathy, lumbar region: Secondary | ICD-10-CM | POA: Diagnosis not present

## 2021-02-02 DIAGNOSIS — M6281 Muscle weakness (generalized): Secondary | ICD-10-CM | POA: Diagnosis not present

## 2021-02-02 DIAGNOSIS — R269 Unspecified abnormalities of gait and mobility: Secondary | ICD-10-CM | POA: Diagnosis not present

## 2021-02-11 DIAGNOSIS — M6281 Muscle weakness (generalized): Secondary | ICD-10-CM | POA: Diagnosis not present

## 2021-02-11 DIAGNOSIS — M47816 Spondylosis without myelopathy or radiculopathy, lumbar region: Secondary | ICD-10-CM | POA: Diagnosis not present

## 2021-02-11 DIAGNOSIS — R269 Unspecified abnormalities of gait and mobility: Secondary | ICD-10-CM | POA: Diagnosis not present

## 2021-02-16 DIAGNOSIS — R269 Unspecified abnormalities of gait and mobility: Secondary | ICD-10-CM | POA: Diagnosis not present

## 2021-02-16 DIAGNOSIS — M6281 Muscle weakness (generalized): Secondary | ICD-10-CM | POA: Diagnosis not present

## 2021-02-16 DIAGNOSIS — M47816 Spondylosis without myelopathy or radiculopathy, lumbar region: Secondary | ICD-10-CM | POA: Diagnosis not present

## 2021-02-18 DIAGNOSIS — M6281 Muscle weakness (generalized): Secondary | ICD-10-CM | POA: Diagnosis not present

## 2021-02-18 DIAGNOSIS — M47816 Spondylosis without myelopathy or radiculopathy, lumbar region: Secondary | ICD-10-CM | POA: Diagnosis not present

## 2021-02-18 DIAGNOSIS — R269 Unspecified abnormalities of gait and mobility: Secondary | ICD-10-CM | POA: Diagnosis not present

## 2023-02-14 ENCOUNTER — Encounter: Payer: Self-pay | Admitting: Physical Medicine and Rehabilitation

## 2023-02-14 ENCOUNTER — Encounter
Payer: Medicare Other | Attending: Physical Medicine and Rehabilitation | Admitting: Physical Medicine and Rehabilitation

## 2023-02-14 VITALS — BP 145/89 | HR 64 | Ht 73.0 in | Wt 195.0 lb

## 2023-02-14 DIAGNOSIS — M792 Neuralgia and neuritis, unspecified: Secondary | ICD-10-CM | POA: Insufficient documentation

## 2023-02-14 DIAGNOSIS — G8250 Quadriplegia, unspecified: Secondary | ICD-10-CM | POA: Insufficient documentation

## 2023-02-14 DIAGNOSIS — R2242 Localized swelling, mass and lump, left lower limb: Secondary | ICD-10-CM | POA: Insufficient documentation

## 2023-02-14 NOTE — Progress Notes (Signed)
Subjective:    Patient ID: Ryan Reed, male    DOB: Apr 12, 1951, 72 y.o.   MRN: [id number]  HPI Mrs. 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   1) LLE swelling -has an AFO that has a strap around his ankle and around his calf and it gets very uncomfortable  2) Quadriplegia -still has to cath all the time and do dig stim some time -he has returned to work and is on feet most of the day.  3) Pain -has questions about diet and exercise  Neuro/Psych numbness tingling trouble walking spasms  Prior Studies Any changes since last visit?  no  Physicians involved in your care Any changes since last visit?  yes   Family History  Problem Relation Age of Onset   Heart disease Father    Cardiomyopathy Sister    Social History   Socioeconomic History   Marital status: Married    Spouse name: Not on file   Number of children: Not on file   Years of education: Not on file   Highest education level: Not on file  Occupational History   Not on file  Tobacco Use   Smoking status: Former    Types: Cigarettes    Quit date: 11/21/1988    Years since quitting: 34.2   Smokeless tobacco: Never  Vaping Use   Vaping Use: Never used  Substance and Sexual Activity   Alcohol use: Yes    Alcohol/week: 12.0 - 14.0 standard drinks of alcohol    Types: 12 - 14 Cans of beer per week   Drug use: Yes   Sexual activity: Not on file  Other Topics Concern   Not on file  Social History Narrative   Not on file   Social Determinants of Health   Financial Resource Strain: Not on file  Food Insecurity: Not on file  Transportation Needs: Not on file  Physical Activity: Not on file  Stress: Not on file  Social Connections: Not on file   Past Surgical History:  Procedure Laterality Date   LACERATION REPAIR Left    left thigh   THORACIC LAMINECTOMY FOR EPIDURAL ABSCESS N/A 10/14/2019   Procedure: Cervical seven - THORACIC  seven LAMINECTOMIES FOR EPIDURAL ABSCESS;  Surgeon: [name], MD;  Location: [facility];  Service: Neurosurgery;  Laterality: N/A;   History reviewed. No pertinent past medical history. BP (!) 145/89   Pulse 64   Ht 6\' 1"  (1.854 m)   Wt 195 lb (88.5 kg)   SpO2 98%   BMI 25.73 kg/m   Opioid Risk Score:   Fall Risk Score:  `1  Depression screen [facility] 2/9     02/14/2023   11:31 AM 03/06/2020    1:11 PM 11/26/2019    1:36 PM  Depression screen PHQ 2/9  Decreased Interest 0 0 0  Down, Depressed, Hopeless 0 0 1  PHQ - 2 Score 0 0 1    Review of Systems  Musculoskeletal:  Positive for gait problem.       SPASMS TINGLING  Neurological:  Positive for weakness and numbness.  All other systems reviewed and are negative.      Objective:   Physical Exam Gen: no distress, normal appearing HEENT: oral mucosa pink and moist, NCAT Cardio: Reg rate Chest: normal effort, normal rate of breathing Abd: soft, non-distended Ext: no edema Psych: pleasant, normal affect Skin: intact Neuro: Alert and oriented x3 MSK:  using rolling walker       Assessment & Plan:   1) LLE swelling -will provide script for new AFO  2) Quadriplegia -continue catheterization and dig stim -continue driving  3) Pain -Provided with a pain relief journal and discussed that it contains foods and lifestyle tips to naturally help to improve pain. Discussed that these lifestyle strategies are also very good for health unlike some medications which can have negative side effects. Discussed that the act of keeping a journal can be therapeutic and helpful to realize patterns what helps to trigger and alleviate pain.

## 2023-02-14 NOTE — Progress Notes (Deleted)
   Subjective:    Patient ID: Ryan Reed, male    DOB: [birth year], 72 y.o.   MRN: [id number]  HPI    Review of Systems     Objective:   Physical Exam        Assessment & Plan:

## 2023-03-27 ENCOUNTER — Encounter: Payer: Medicare Other | Admitting: Physical Medicine and Rehabilitation

## 2023-05-17 ENCOUNTER — Encounter: Payer: Medicare Other | Admitting: Physical Medicine and Rehabilitation
